# Patient Record
Sex: Female | Born: 1960 | Race: Black or African American | Hispanic: No | Marital: Single | Smoking: Former smoker
Health system: Southern US, Community
[De-identification: ages and names within clinical notes are randomized; demographics above are authoritative.]

## PROBLEM LIST (undated history)

## (undated) DIAGNOSIS — I10 Essential (primary) hypertension: Secondary | ICD-10-CM

## (undated) DIAGNOSIS — M1990 Unspecified osteoarthritis, unspecified site: Secondary | ICD-10-CM

## (undated) DIAGNOSIS — Z9289 Personal history of other medical treatment: Secondary | ICD-10-CM

## (undated) DIAGNOSIS — K219 Gastro-esophageal reflux disease without esophagitis: Secondary | ICD-10-CM

## (undated) DIAGNOSIS — D649 Anemia, unspecified: Secondary | ICD-10-CM

## (undated) DIAGNOSIS — H547 Unspecified visual loss: Secondary | ICD-10-CM

## (undated) DIAGNOSIS — E119 Type 2 diabetes mellitus without complications: Secondary | ICD-10-CM

## (undated) HISTORY — DX: Anemia, unspecified: D64.9

## (undated) HISTORY — DX: Unspecified visual loss: H54.7

## (undated) HISTORY — PX: SHOULDER ARTHROSCOPY W/ ROTATOR CUFF REPAIR: SHX2400

## (undated) HISTORY — PX: EYE SURGERY: SHX253

## (undated) HISTORY — PX: TUBAL LIGATION: SHX77

## (undated) HISTORY — PX: FOOT SURGERY: SHX648

## (undated) HISTORY — DX: Essential (primary) hypertension: I10

## (undated) HISTORY — PX: OTHER SURGICAL HISTORY: SHX169

---

## 1997-12-09 ENCOUNTER — Emergency Department (HOSPITAL_COMMUNITY): Admission: EM | Admit: 1997-12-09 | Discharge: 1997-12-10 | Payer: Self-pay | Admitting: Emergency Medicine

## 1998-08-10 ENCOUNTER — Emergency Department (HOSPITAL_COMMUNITY): Admission: EM | Admit: 1998-08-10 | Discharge: 1998-08-10 | Payer: Self-pay | Admitting: Emergency Medicine

## 1998-08-22 ENCOUNTER — Emergency Department (HOSPITAL_COMMUNITY): Admission: EM | Admit: 1998-08-22 | Discharge: 1998-08-23 | Payer: Self-pay | Admitting: Emergency Medicine

## 2000-04-12 ENCOUNTER — Emergency Department (HOSPITAL_COMMUNITY): Admission: EM | Admit: 2000-04-12 | Discharge: 2000-04-12 | Payer: Self-pay | Admitting: Emergency Medicine

## 2000-05-24 ENCOUNTER — Emergency Department (HOSPITAL_COMMUNITY): Admission: EM | Admit: 2000-05-24 | Discharge: 2000-05-24 | Payer: Self-pay | Admitting: Emergency Medicine

## 2000-11-13 ENCOUNTER — Encounter: Payer: Self-pay | Admitting: Emergency Medicine

## 2000-11-13 ENCOUNTER — Emergency Department (HOSPITAL_COMMUNITY): Admission: EM | Admit: 2000-11-13 | Discharge: 2000-11-13 | Payer: Self-pay | Admitting: Emergency Medicine

## 2000-12-12 ENCOUNTER — Emergency Department (HOSPITAL_COMMUNITY): Admission: EM | Admit: 2000-12-12 | Discharge: 2000-12-12 | Payer: Self-pay | Admitting: Emergency Medicine

## 2003-02-04 ENCOUNTER — Emergency Department (HOSPITAL_COMMUNITY): Admission: EM | Admit: 2003-02-04 | Discharge: 2003-02-04 | Payer: Self-pay | Admitting: Emergency Medicine

## 2003-07-19 ENCOUNTER — Emergency Department (HOSPITAL_COMMUNITY): Admission: EM | Admit: 2003-07-19 | Discharge: 2003-07-19 | Payer: Self-pay | Admitting: Emergency Medicine

## 2006-12-30 ENCOUNTER — Emergency Department (HOSPITAL_COMMUNITY): Admission: EM | Admit: 2006-12-30 | Discharge: 2006-12-30 | Payer: Self-pay | Admitting: Emergency Medicine

## 2007-01-01 ENCOUNTER — Emergency Department (HOSPITAL_COMMUNITY): Admission: EM | Admit: 2007-01-01 | Discharge: 2007-01-01 | Payer: Self-pay | Admitting: Emergency Medicine

## 2007-10-12 ENCOUNTER — Emergency Department (HOSPITAL_COMMUNITY): Admission: EM | Admit: 2007-10-12 | Discharge: 2007-10-12 | Payer: Self-pay | Admitting: Emergency Medicine

## 2007-10-14 ENCOUNTER — Emergency Department (HOSPITAL_COMMUNITY): Admission: EM | Admit: 2007-10-14 | Discharge: 2007-10-14 | Payer: Self-pay | Admitting: Emergency Medicine

## 2007-10-19 ENCOUNTER — Emergency Department (HOSPITAL_COMMUNITY): Admission: EM | Admit: 2007-10-19 | Discharge: 2007-10-19 | Payer: Self-pay | Admitting: Emergency Medicine

## 2007-10-30 ENCOUNTER — Emergency Department (HOSPITAL_COMMUNITY): Admission: EM | Admit: 2007-10-30 | Discharge: 2007-10-30 | Payer: Self-pay | Admitting: *Deleted

## 2009-11-29 LAB — CBC AND DIFFERENTIAL: WBC: 7.4 10^3/mL

## 2009-11-29 LAB — TSH: TSH: 1.62 u[IU]/mL (ref <=5.90)

## 2010-11-03 LAB — BASIC METABOLIC PANEL
BUN: 13 mg/dL (ref 4–21)
Creatinine: 0.8 mg/dL (ref 0.5–1.1)
Glucose: 166 mg/dL
Potassium: 4.2 mmol/L (ref 3.4–5.3)

## 2010-12-02 ENCOUNTER — Emergency Department (HOSPITAL_COMMUNITY)
Admission: EM | Admit: 2010-12-02 | Discharge: 2010-12-02 | Disposition: A | Discharge status: Home / Self Care | Payer: Self-pay | Attending: Emergency Medicine | Admitting: Emergency Medicine

## 2010-12-02 ENCOUNTER — Emergency Department (HOSPITAL_COMMUNITY): Payer: Self-pay

## 2010-12-02 DIAGNOSIS — I10 Essential (primary) hypertension: Secondary | ICD-10-CM | POA: Insufficient documentation

## 2010-12-02 DIAGNOSIS — M109 Gout, unspecified: Secondary | ICD-10-CM | POA: Insufficient documentation

## 2010-12-02 DIAGNOSIS — R002 Palpitations: Secondary | ICD-10-CM | POA: Insufficient documentation

## 2010-12-02 DIAGNOSIS — R21 Rash and other nonspecific skin eruption: Secondary | ICD-10-CM | POA: Insufficient documentation

## 2010-12-02 DIAGNOSIS — T465X5A Adverse effect of other antihypertensive drugs, initial encounter: Secondary | ICD-10-CM | POA: Insufficient documentation

## 2010-12-02 DIAGNOSIS — R609 Edema, unspecified: Secondary | ICD-10-CM | POA: Insufficient documentation

## 2010-12-15 DIAGNOSIS — H53139 Sudden visual loss, unspecified eye: Secondary | ICD-10-CM | POA: Insufficient documentation

## 2011-02-20 DIAGNOSIS — H53419 Scotoma involving central area, unspecified eye: Secondary | ICD-10-CM | POA: Insufficient documentation

## 2011-02-20 DIAGNOSIS — Z961 Presence of intraocular lens: Secondary | ICD-10-CM | POA: Insufficient documentation

## 2011-02-20 DIAGNOSIS — H2510 Age-related nuclear cataract, unspecified eye: Secondary | ICD-10-CM | POA: Insufficient documentation

## 2011-03-27 ENCOUNTER — Ambulatory Visit: Payer: Self-pay | Admitting: Family Medicine

## 2011-04-05 ENCOUNTER — Ambulatory Visit: Payer: Self-pay | Admitting: Family Medicine

## 2011-04-05 DIAGNOSIS — F411 Generalized anxiety disorder: Secondary | ICD-10-CM

## 2011-04-05 DIAGNOSIS — M79609 Pain in unspecified limb: Secondary | ICD-10-CM

## 2011-04-05 DIAGNOSIS — I10 Essential (primary) hypertension: Secondary | ICD-10-CM

## 2011-04-11 ENCOUNTER — Encounter: Payer: Self-pay | Admitting: *Deleted

## 2011-04-12 ENCOUNTER — Encounter: Payer: Self-pay | Admitting: *Deleted

## 2011-04-17 ENCOUNTER — Telehealth: Payer: Self-pay

## 2011-04-17 NOTE — Telephone Encounter (Signed)
.  [facility]   PT CONCERNED WITH BP READINGS, BP MEDS NOT WORKING, READINGS ARE 168/91,204/107.183/89  PLEASE ADVISE.   BEST PHONE [phone]

## 2011-04-17 NOTE — Telephone Encounter (Signed)
Pt was put on Xanax about a week and a half ago and stated that would help along with the other blood pressure meds to bring down pressure.  The meds are not helping and pt states she has no chest pains, no headache, no dizziness.  She wants to know what should she do? Please advise.

## 2011-04-17 NOTE — Telephone Encounter (Signed)
LMOM TO CB 

## 2011-04-17 NOTE — Telephone Encounter (Signed)
Pt needs office visit to address BP, please have her bring all current meds.  Thanks.

## 2011-04-17 NOTE — Telephone Encounter (Signed)
SPOKE WITH PT AND ADVISED TO RTC TO DISCUSS BP. PT AGREED AND WILL RECHECK TOMORROW

## 2011-04-26 ENCOUNTER — Encounter: Payer: Self-pay | Admitting: Family Medicine

## 2011-04-26 DIAGNOSIS — I10 Essential (primary) hypertension: Secondary | ICD-10-CM

## 2011-04-26 DIAGNOSIS — H547 Unspecified visual loss: Secondary | ICD-10-CM

## 2011-04-26 DIAGNOSIS — M109 Gout, unspecified: Secondary | ICD-10-CM | POA: Insufficient documentation

## 2011-04-26 DIAGNOSIS — D649 Anemia, unspecified: Secondary | ICD-10-CM | POA: Insufficient documentation

## 2011-05-09 ENCOUNTER — Other Ambulatory Visit: Payer: Self-pay | Admitting: Family Medicine

## 2011-05-30 ENCOUNTER — Other Ambulatory Visit: Payer: Self-pay | Admitting: Physician Assistant

## 2011-06-09 ENCOUNTER — Other Ambulatory Visit: Payer: Self-pay | Admitting: Physician Assistant

## 2011-06-13 ENCOUNTER — Other Ambulatory Visit: Payer: Self-pay | Admitting: Physician Assistant

## 2011-06-13 ENCOUNTER — Telehealth: Payer: Self-pay

## 2011-06-13 ENCOUNTER — Other Ambulatory Visit: Payer: Self-pay | Admitting: Family Medicine

## 2011-06-13 NOTE — Telephone Encounter (Signed)
MR Leech CALLED TO SAY HIS WIFE IS OUT OF HER BP MEDS AND EVEN THOUGH SHE HAVE A BALANCE HERE, SHE CANNOT KEEP COMING IN AND HAVING HER BILL GETS HIGHER. HE WANTED [place] TO CALL IN SOME FOR ANOTHER THREE MONTHS AND WHEN TOLD TO CALL THE PHARMACY, HE STATED IF SOMETHING HAPPENS TO HIS WIFE BECAUSE WE WON'T CALL IN ANY MEDICINE, HE IS GOING TO MAKE SURE HE SUES. PLEASE CALL [phone]

## 2011-06-14 MED ORDER — VITAMIN D (ERGOCALCIFEROL) 1.25 MG (50000 UNIT) PO CAPS: 50000.0000 [IU] | ORAL_CAPSULE | ORAL | Status: DC

## 2011-06-14 NOTE — Telephone Encounter (Signed)
Addended by: Morrell Riddle on: 06/14/2011 03:38 PM   Modules accepted: Orders

## 2011-06-14 NOTE — Telephone Encounter (Signed)
Please note that the phone # for pt is [phone]. Spoke with pt regarding the importance of regular visits.  I will send in meds for 1 month but pt to make an appt for a recheck.  She should also talk to someone in the patient's assistance program while she is waiting for disability.

## 2011-06-21 ENCOUNTER — Ambulatory Visit (INDEPENDENT_AMBULATORY_CARE_PROVIDER_SITE_OTHER): Payer: Self-pay | Admitting: Family Medicine

## 2011-06-21 ENCOUNTER — Encounter: Payer: Self-pay | Admitting: Family Medicine

## 2011-06-21 VITALS — BP 196/105 | HR 86 | Temp 98.1°F | Resp 18 | Ht 66.0 in | Wt 247.0 lb

## 2011-06-21 DIAGNOSIS — I10 Essential (primary) hypertension: Secondary | ICD-10-CM

## 2011-06-21 DIAGNOSIS — M109 Gout, unspecified: Secondary | ICD-10-CM

## 2011-06-21 DIAGNOSIS — IMO0001 Reserved for inherently not codable concepts without codable children: Secondary | ICD-10-CM

## 2011-06-21 DIAGNOSIS — E559 Vitamin D deficiency, unspecified: Secondary | ICD-10-CM

## 2011-06-21 MED ORDER — TRAMADOL HCL 50 MG PO TABS
50.0000 mg | ORAL_TABLET | Freq: Three times a day (TID) | ORAL | Status: AC | PRN
Start: 2011-06-21 — End: 2011-07-01

## 2011-06-21 MED ORDER — ALLOPURINOL 300 MG PO TABS: 300.0000 mg | ORAL_TABLET | Freq: Every day | ORAL | Status: DC

## 2011-06-21 MED ORDER — COLCHICINE 0.6 MG PO TABS: 0.6000 mg | ORAL_TABLET | Freq: Two times a day (BID) | ORAL | Status: DC

## 2011-06-21 MED ORDER — LISINOPRIL 20 MG PO TABS: 20.0000 mg | ORAL_TABLET | Freq: Every day | ORAL | Status: DC

## 2011-06-21 MED ORDER — METOPROLOL TARTRATE 50 MG PO TABS: 50.0000 mg | ORAL_TABLET | Freq: Two times a day (BID) | ORAL | Status: DC

## 2011-06-21 NOTE — Progress Notes (Signed)
  Subjective:    Patient ID: Lacey Meyer, female    DOB: [birth year], 51 y.o.   MRN: [id number]  HPI Pt here for BP recheck. She has malignant HTN and a history of noncompliance. She states  BP at home was 139/84 last week. She ate some food that caused her BP to go up ; she denies  symptoms of headache or chest pain. She has changed her diet and is starting to walk. She has  had a recent flare of Gout.    Review of Systems  Constitutional: Positive for activity change. Negative for fever, diaphoresis and appetite change.  Respiratory: Negative for cough, chest tightness and shortness of breath.   Cardiovascular: Negative for chest pain, palpitations and leg swelling.  Musculoskeletal: Positive for joint swelling.  Neurological: Positive for light-headedness. Negative for dizziness, syncope, numbness and headaches.       Objective:   Physical Exam  Nursing note and vitals reviewed. Constitutional: She is oriented to person, place, and time. She appears well-developed and well-nourished. No distress.  HENT:  Head: Normocephalic and atraumatic.  Eyes:       Pt wears sunglasses throughout visit  Cardiovascular: Normal rate and regular rhythm.   Pulmonary/Chest: Effort normal. No respiratory distress.  Musculoskeletal:       Bilateral foot swelling distally related to Gout  Neurological: She is alert and oriented to person, place, and time. No cranial nerve deficit.  Psychiatric: She has a normal mood and affect. Her behavior is normal. Thought content normal.    Uric acid (Nov. 2012)= 9.6      Assessment & Plan:   1. HTN (hypertension)  Basic metabolic panel; continue current medications and lifestyle / nutrition changes. Limit salt intake.  2. Gout attack  Uric Acid; RX: Colchicine 0.6  #60  5 RFs - pt gets this through a pt's assistance program RX: Tramadol for pain; will no longer prescribe Generic Percocet   3. Vitamin D deficiency  Vitamin D, 25-hydroxy- pt has had  deficiency in the past

## 2011-06-22 LAB — BASIC METABOLIC PANEL
BUN: 9 mg/dL (ref 6–23)
CO2: 23 mEq/L (ref 19–32)
Calcium: 10 mg/dL (ref 8.4–10.5)
Glucose, Bld: 150 mg/dL — ABNORMAL HIGH (ref 70–99)

## 2011-06-22 LAB — URIC ACID: Uric Acid, Serum: 12.4 mg/dL — ABNORMAL HIGH (ref 2.4–7.0)

## 2011-06-25 ENCOUNTER — Encounter: Payer: Self-pay | Admitting: Family Medicine

## 2011-06-25 DIAGNOSIS — IMO0001 Reserved for inherently not codable concepts without codable children: Secondary | ICD-10-CM | POA: Insufficient documentation

## 2011-06-26 NOTE — Progress Notes (Signed)
Quick Note:  Please call pt and advise that the following labs are abnormal...  Vitamin D level is in the low normal range.  You are still having trouble with Gout flare up because uric acid level is very high. Keep taking the Colcrys (colchicine) twice a day and eliminate  those foods that you know are making Gout worse. Blood sugar is above normal; eliminate concentrated sugars ( "sweets"), sodas and starches like white bread from diet and eat less rice, potatoes and pasta. We will need to re-check your blood sugar at your next visit.  Copy of labs to pt. ______

## 2011-06-27 ENCOUNTER — Telehealth: Payer: Self-pay

## 2011-06-27 NOTE — Telephone Encounter (Signed)
Please advise 

## 2011-06-27 NOTE — Telephone Encounter (Signed)
1. Call the Rx for colchicine in to the assistance program (you may need to get it from her chart) as per the order in the last OV with Dr. Audria Nine.  2. Then forward the message to Dr. Audria Nine regarding the pain not alleviated by the Tramadol.

## 2011-06-27 NOTE — Telephone Encounter (Signed)
.  [facility] The patient called to state that she needs the Colchicine medication called into the 1-800 # for the assistance program and that the Tramadol is not helping with her gout pain.  The patient states she is in severe pain from the gout and needs a medication that will relieve the symptoms.  Please call the patient at [phone].

## 2011-07-02 NOTE — Telephone Encounter (Signed)
Chart pulled to TL

## 2011-07-02 NOTE — Telephone Encounter (Signed)
Called pt assistance program and was told they had just shipped 30 day supply of colchicine to pt. Gave OK on asst prog VM that they can dispense 90 day at a time w/1 RF if pt prefers. Dr Audria Nine, do you want to Rx a stronger pain med for pt?

## 2011-07-04 NOTE — Telephone Encounter (Signed)
I discussed pain medication with pt at last visit; I will not prescribe any stronger medication for pain.

## 2011-07-05 NOTE — Telephone Encounter (Signed)
LMOM to CB. 

## 2011-07-05 NOTE — Telephone Encounter (Signed)
Gave pt message from Dr Audria Nine about pain meds and info about call to assis program. Pt reports she has gotten her colchicine and she has/is making changes in her eating/exercise that seems to be helping the pain be more tolerable, and hasn't been taking the tramadol since it wasn't helping and she didn't want to keep taking it.

## 2011-08-05 ENCOUNTER — Emergency Department (HOSPITAL_COMMUNITY)
Admission: EM | Admit: 2011-08-05 | Discharge: 2011-08-05 | Disposition: A | Discharge status: Home / Self Care | Payer: Medicaid Other | Attending: Emergency Medicine | Admitting: Emergency Medicine

## 2011-08-05 ENCOUNTER — Encounter (HOSPITAL_COMMUNITY): Payer: Self-pay | Admitting: *Deleted

## 2011-08-05 DIAGNOSIS — R300 Dysuria: Secondary | ICD-10-CM | POA: Insufficient documentation

## 2011-08-05 DIAGNOSIS — R319 Hematuria, unspecified: Secondary | ICD-10-CM | POA: Insufficient documentation

## 2011-08-05 DIAGNOSIS — R350 Frequency of micturition: Secondary | ICD-10-CM | POA: Insufficient documentation

## 2011-08-05 DIAGNOSIS — Z79899 Other long term (current) drug therapy: Secondary | ICD-10-CM | POA: Insufficient documentation

## 2011-08-05 DIAGNOSIS — R279 Unspecified lack of coordination: Secondary | ICD-10-CM | POA: Insufficient documentation

## 2011-08-05 DIAGNOSIS — Z8639 Personal history of other endocrine, nutritional and metabolic disease: Secondary | ICD-10-CM | POA: Insufficient documentation

## 2011-08-05 DIAGNOSIS — N390 Urinary tract infection, site not specified: Secondary | ICD-10-CM | POA: Insufficient documentation

## 2011-08-05 DIAGNOSIS — Z862 Personal history of diseases of the blood and blood-forming organs and certain disorders involving the immune mechanism: Secondary | ICD-10-CM | POA: Insufficient documentation

## 2011-08-05 DIAGNOSIS — I10 Essential (primary) hypertension: Secondary | ICD-10-CM | POA: Insufficient documentation

## 2011-08-05 LAB — URINALYSIS, ROUTINE W REFLEX MICROSCOPIC
Bilirubin Urine: NEGATIVE
Glucose, UA: NEGATIVE mg/dL
Specific Gravity, Urine: 1.015 (ref 1.005–1.030)
Urobilinogen, UA: 0.2 mg/dL (ref 0.0–1.0)

## 2011-08-05 LAB — URINE MICROSCOPIC-ADD ON

## 2011-08-05 MED ORDER — CIPROFLOXACIN HCL 500 MG PO TABS: 500.0000 mg | ORAL_TABLET | Freq: Two times a day (BID) | ORAL | Status: AC

## 2011-08-05 MED ORDER — PHENAZOPYRIDINE HCL 200 MG PO TABS: 200.0000 mg | ORAL_TABLET | Freq: Three times a day (TID) | ORAL | Status: AC

## 2011-08-05 NOTE — ED Notes (Signed)
Pt from home with reports of burning and pain with urination as well as urinary urgency, frequency and hematuria that started today at 1100.

## 2011-08-05 NOTE — Discharge Instructions (Signed)

## 2011-08-05 NOTE — ED Provider Notes (Signed)
History     CSN: [id number]  Arrival date & time 08/05/11  1528   First MD Initiated Contact with Patient 08/05/11 1617      Chief Complaint  Patient presents with  . Dysuria  . Hematuria    (Consider location/radiation/quality/duration/timing/severity/associated sxs/prior treatment) Patient is a 51 y.o. female presenting with dysuria and hematuria. The history is provided by the patient.  Dysuria  This is a new problem. The current episode started 3 to 5 hours ago. The problem occurs every urination. The problem has not changed since onset.The quality of the pain is described as burning. The pain is moderate. There has been no fever. There is a history of pyelonephritis (once a decade ago). Associated symptoms include frequency and hematuria. Pertinent negatives include no nausea, no vomiting and no flank pain. Her past medical history does not include kidney stones, single kidney, urological procedure or catheterization.  Hematuria Irritative symptoms include frequency. Associated symptoms include dysuria. Pertinent negatives include no abdominal pain, flank pain, nausea or vomiting. There is no history of kidney stones.   patient has hypertension here. She states every time she goes to the doctor her blood pressure goes up. She states that her doctor has her take a Xanax before she goes. No chest pain. No trouble breathing. No numbness or weakness.  Past Medical History  Diagnosis Date  . HTN (hypertension)   . Anemia   . Gout   . Impaired vision     Past Surgical History  Procedure Date  . Shoulder arthroscopy w/ rotator cuff repair   . Btl   . Eye surgery     Family History  Problem Relation Age of Onset  . Hypertension Mother   . Hypertension Father     History  Substance Use Topics  . Smoking status: Former Smoker    Quit date: 03/06/2010  . Smokeless tobacco: Never Used  . Alcohol Use: No    OB History    Grav Para Term Preterm Abortions TAB SAB Ect Mult  Living                  Review of Systems  Constitutional: Negative for activity change and appetite change.  HENT: Negative for neck stiffness.   Eyes: Negative for pain.  Respiratory: Negative for chest tightness and shortness of breath.   Cardiovascular: Negative for chest pain and leg swelling.  Gastrointestinal: Negative for nausea, vomiting, abdominal pain and diarrhea.  Genitourinary: Positive for dysuria, frequency and hematuria. Negative for flank pain.  Musculoskeletal: Negative for back pain.       Patient has some pain in her joints for gout.  Skin: Negative for rash.  Neurological: Negative for weakness, numbness and headaches.  Psychiatric/Behavioral: Negative for behavioral problems.    Allergies  Ampicillin and Doxycycline  Home Medications   Current Outpatient Rx  Name Route Sig Dispense Refill  . ALLOPURINOL 300 MG PO TABS Oral Take 1 tablet (300 mg total) by mouth daily. 30 tablet 5  . ALPRAZOLAM 0.25 MG PO TABS Oral Take 0.25 mg by mouth 3 (three) times daily as needed. For anxiety    . COLCHICINE 0.6 MG PO TABS Oral Take 1 tablet (0.6 mg total) by mouth 2 (two) times daily. 60 tablet 5  . LISINOPRIL 20 MG PO TABS Oral Take 1 tablet (20 mg total) by mouth daily. 30 tablet 5  . METOPROLOL TARTRATE 50 MG PO TABS Oral Take 1 tablet (50 mg total) by mouth 2 (two) times  daily. 60 tablet 5  . TRAMADOL HCL 50 MG PO TABS Oral Take 50 mg by mouth every 6 (six) hours as needed. For pain    . CIPROFLOXACIN HCL 500 MG PO TABS Oral Take 1 tablet (500 mg total) by mouth every 12 (twelve) hours. 6 tablet 0  . PHENAZOPYRIDINE HCL 200 MG PO TABS Oral Take 1 tablet (200 mg total) by mouth 3 (three) times daily. 6 tablet 0    BP 213/96  Pulse 91  Temp(Src) 98.3 F (36.8 C) (Oral)  Resp 20  Ht 5\' 8"  (1.727 m)  Wt 266 lb 15.6 oz (121.1 kg)  BMI 40.59 kg/m2  SpO2 100%  Physical Exam  Constitutional: She appears well-developed.  HENT:  Head: Normocephalic.  Eyes:  Pupils are equal, round, and reactive to light.  Cardiovascular: Normal rate.   Pulmonary/Chest: Effort normal.  Abdominal: Soft. There is no tenderness.  Genitourinary:       No CVA tenderness  Musculoskeletal: Normal range of motion.  Neurological: She is alert. Coordination abnormal.  Skin: Skin is warm.    ED Course  Procedures (including critical care time)  Labs Reviewed  URINALYSIS, ROUTINE W REFLEX MICROSCOPIC - Abnormal; Notable for the following:    APPearance CLOUDY (*)    Hgb urine dipstick LARGE (*)    Protein, ur 100 (*)    Nitrite POSITIVE (*)    Leukocytes, UA MODERATE (*)    All other components within normal limits  URINE MICROSCOPIC-ADD ON - Abnormal; Notable for the following:    Bacteria, UA MANY (*)    All other components within normal limits  URINE CULTURE   No results found.   1. UTI (urinary tract infection)       MDM  Patient with dysuria along with frequency and hematuria. Began this morning. She is in apparent urinary tract infection. She is hypertensive, but states he always gets this way with his Dr. She keeps monitors of her blood pressure home has not been as high. She will followup with her PCP.        Juliet Rude. Rubin Payor, MD 08/05/11 1751

## 2011-08-08 LAB — URINE CULTURE

## 2011-08-09 NOTE — ED Notes (Signed)
+  Urine. Patient treated with Cipro. Sensitive to same. Per protocol MD. °

## 2011-10-03 ENCOUNTER — Ambulatory Visit: Payer: Self-pay | Admitting: Family Medicine

## 2011-10-03 VITALS — BP 152/88 | HR 79 | Temp 98.7°F | Resp 20 | Ht 67.0 in | Wt 253.0 lb

## 2011-10-03 DIAGNOSIS — R609 Edema, unspecified: Secondary | ICD-10-CM

## 2011-10-03 DIAGNOSIS — M109 Gout, unspecified: Secondary | ICD-10-CM

## 2011-10-03 DIAGNOSIS — I10 Essential (primary) hypertension: Secondary | ICD-10-CM

## 2011-10-03 DIAGNOSIS — G4700 Insomnia, unspecified: Secondary | ICD-10-CM

## 2011-10-03 DIAGNOSIS — G8929 Other chronic pain: Secondary | ICD-10-CM

## 2011-10-03 LAB — COMPREHENSIVE METABOLIC PANEL
ALT: 19 U/L (ref 0–35)
AST: 17 U/L (ref 0–37)
Albumin: 4.3 g/dL (ref 3.5–5.2)
Alkaline Phosphatase: 65 U/L (ref 39–117)
BUN: 8 mg/dL (ref 6–23)
CO2: 26 mEq/L (ref 19–32)
Calcium: 9.5 mg/dL (ref 8.4–10.5)
Chloride: 98 mEq/L (ref 96–112)
Creat: 0.61 mg/dL (ref 0.50–1.10)
Glucose, Bld: 154 mg/dL — ABNORMAL HIGH (ref 70–99)
Potassium: 3.3 mEq/L — ABNORMAL LOW (ref 3.5–5.3)
Sodium: 137 mEq/L (ref 135–145)
Total Bilirubin: 0.3 mg/dL (ref 0.3–1.2)
Total Protein: 7.4 g/dL (ref 6.0–8.3)

## 2011-10-03 LAB — LIPID PANEL
Cholesterol: 181 mg/dL (ref 0–200)
HDL: 45 mg/dL (ref >39)
LDL Cholesterol: 69 mg/dL (ref 0–99)
Total CHOL/HDL Ratio: 4 Ratio
Triglycerides: 335 mg/dL — ABNORMAL HIGH (ref <150)
VLDL: 67 mg/dL — ABNORMAL HIGH (ref 0–40)

## 2011-10-03 LAB — POCT CBC
Granulocyte percent: 54.6 %G (ref 37–80)
HCT, POC: 44.3 % (ref 37.7–47.9)
Hemoglobin: 13.8 g/dL (ref 12.2–16.2)
Lymph, poc: 2.9 (ref 0.6–3.4)
MCH, POC: 27.6 pg (ref 27–31.2)
MCHC: 31.2 g/dL — AB (ref 31.8–35.4)
MCV: 88.7 fL (ref 80–97)
MID (cbc): 0.7 (ref 0–0.9)
MPV: 9.4 fL (ref 0–99.8)
POC Granulocyte: 4.4 (ref 2–6.9)
POC LYMPH PERCENT: 36.2 %L (ref 10–50)
POC MID %: 9.2 %M (ref 0–12)
Platelet Count, POC: 322 10*3/uL (ref 142–424)
RBC: 5 M/uL (ref 4.04–5.48)
RDW, POC: 13.6 %
WBC: 8 10*3/uL (ref 4.6–10.2)

## 2011-10-03 LAB — TSH: TSH: 2.524 u[IU]/mL (ref 0.350–4.500)

## 2011-10-03 MED ORDER — SPIRONOLACTONE 25 MG PO TABS: 25.0000 mg | ORAL_TABLET | Freq: Every day | ORAL | Status: DC

## 2011-10-03 MED ORDER — ALPRAZOLAM 0.25 MG PO TABS: 0.2500 mg | ORAL_TABLET | Freq: Two times a day (BID) | ORAL | Status: DC

## 2011-10-03 MED ORDER — LISINOPRIL 20 MG PO TABS: 20.0000 mg | ORAL_TABLET | Freq: Every day | ORAL | Status: DC

## 2011-10-03 MED ORDER — ALLOPURINOL 300 MG PO TABS: 300.0000 mg | ORAL_TABLET | Freq: Every day | ORAL | Status: DC

## 2011-10-03 MED ORDER — METOPROLOL TARTRATE 50 MG PO TABS: 50.0000 mg | ORAL_TABLET | Freq: Two times a day (BID) | ORAL | Status: DC

## 2011-10-03 MED ORDER — GABAPENTIN 300 MG PO CAPS: 300.0000 mg | ORAL_CAPSULE | Freq: Every day | ORAL | Status: DC

## 2011-10-03 MED ORDER — COLCHICINE 0.6 MG PO TABS: 0.6000 mg | ORAL_TABLET | Freq: Two times a day (BID) | ORAL | Status: DC

## 2011-10-03 NOTE — Progress Notes (Signed)
51 yo woman with hypertension for 5 years and impaired vision for 3 years.    She has had extensive evaluations for visual loss at [facility] with scans and retinal studies all normal.  She is legally blind.  She is filing for disability.  Complains of "gout" in left foot and knee.  Objective:  NAD HEENT:  Normal fundi, TM's and mouth Neck: supple, no adenopathy Chest:  Clear Heart: regular, no murmur Ext:  1+ edema  Assessment:  Hypertensive woman with vision loss.  Perplexing loss of vision.  The left sided joint pains do not show obvious joint swelling, but could very well be gout.   Another possibility for vision loss is conversion reaction symptoms.  This would require a neurology eval.  Plan:  1. Hypertension  lisinopril (PRINIVIL,ZESTRIL) 20 MG tablet, metoprolol (LOPRESSOR) 50 MG tablet, POCT CBC, Comprehensive metabolic panel, Lipid panel, TSH  2. Gout  allopurinol (ZYLOPRIM) 300 MG tablet, colchicine 0.6 MG tablet, Comprehensive metabolic panel  3. Insomnia  ALPRAZolam (XANAX) 0.25 MG tablet  4. Edema  spironolactone (ALDACTONE) 25 MG tablet, POCT CBC, Comprehensive metabolic panel, TSH  5. Chronic pain  gabapentin (NEURONTIN) 300 MG capsule   Handicap sticker application completed for patient and driver

## 2011-10-04 ENCOUNTER — Other Ambulatory Visit: Payer: Self-pay | Admitting: Family Medicine

## 2011-10-04 DIAGNOSIS — E876 Hypokalemia: Secondary | ICD-10-CM

## 2011-10-04 MED ORDER — POTASSIUM CHLORIDE CRYS ER 20 MEQ PO TBCR: 20.0000 meq | EXTENDED_RELEASE_TABLET | Freq: Every day | ORAL | Status: DC

## 2011-10-05 ENCOUNTER — Telehealth: Payer: Self-pay

## 2011-10-05 DIAGNOSIS — M109 Gout, unspecified: Secondary | ICD-10-CM

## 2011-10-05 NOTE — Telephone Encounter (Signed)
Lacey Meyer went to [facility] to pick up her prescriptions and the colchicine (ZO#[id number]) was filled there costing $268. The patient qualifies for a program allowing her to receive the rx at a lower cost when called in to [facility] and mailed to her. We apparently need to call this pharmacy to fill the rx with them.  Best [phone] [facility] tel#[phone]

## 2011-10-07 ENCOUNTER — Other Ambulatory Visit: Payer: Self-pay | Admitting: *Deleted

## 2011-10-07 DIAGNOSIS — M109 Gout, unspecified: Secondary | ICD-10-CM

## 2011-10-07 MED ORDER — COLCHICINE 0.6 MG PO TABS: 0.6000 mg | ORAL_TABLET | Freq: Two times a day (BID) | ORAL | Status: DC

## 2011-10-07 NOTE — Telephone Encounter (Signed)
I sent the RX the [facility] for the patient.

## 2011-10-07 NOTE — Telephone Encounter (Signed)
PT NOTIFIED THAT RX WAS SENT INTO PHARMACY 

## 2011-12-13 ENCOUNTER — Other Ambulatory Visit: Payer: Self-pay | Admitting: Internal Medicine

## 2011-12-13 DIAGNOSIS — Z1231 Encounter for screening mammogram for malignant neoplasm of breast: Secondary | ICD-10-CM

## 2012-01-03 ENCOUNTER — Ambulatory Visit
Admission: RE | Admit: 2012-01-03 | Discharge: 2012-01-03 | Disposition: A | Discharge status: Home / Self Care | Payer: Medicaid Other | Source: Ambulatory Visit | Attending: Internal Medicine | Admitting: Internal Medicine

## 2012-01-03 ENCOUNTER — Ambulatory Visit: Payer: Self-pay | Admitting: Family Medicine

## 2012-01-03 DIAGNOSIS — Z1231 Encounter for screening mammogram for malignant neoplasm of breast: Secondary | ICD-10-CM

## 2012-04-12 DIAGNOSIS — Z0271 Encounter for disability determination: Secondary | ICD-10-CM

## 2012-05-01 ENCOUNTER — Telehealth: Payer: Self-pay

## 2012-06-05 ENCOUNTER — Other Ambulatory Visit (HOSPITAL_COMMUNITY): Payer: Self-pay | Admitting: Internal Medicine

## 2012-06-05 DIAGNOSIS — I10 Essential (primary) hypertension: Secondary | ICD-10-CM

## 2012-06-12 ENCOUNTER — Ambulatory Visit (HOSPITAL_COMMUNITY)
Admission: RE | Admit: 2012-06-12 | Discharge: 2012-06-12 | Disposition: A | Discharge status: Home / Self Care | Payer: Medicaid Other | Source: Ambulatory Visit | Attending: Cardiovascular Disease | Admitting: Cardiovascular Disease

## 2012-06-12 DIAGNOSIS — I10 Essential (primary) hypertension: Secondary | ICD-10-CM | POA: Insufficient documentation

## 2012-06-12 DIAGNOSIS — H53129 Transient visual loss, unspecified eye: Secondary | ICD-10-CM | POA: Insufficient documentation

## 2012-06-12 DIAGNOSIS — H547 Unspecified visual loss: Secondary | ICD-10-CM

## 2012-06-12 NOTE — Progress Notes (Signed)
Carotid Duplex Complete Lacey Meyer 

## 2012-06-23 NOTE — Telephone Encounter (Signed)
ERROR

## 2012-08-25 ENCOUNTER — Other Ambulatory Visit: Payer: Self-pay | Admitting: Internal Medicine

## 2012-08-25 DIAGNOSIS — I10 Essential (primary) hypertension: Secondary | ICD-10-CM

## 2012-08-27 ENCOUNTER — Ambulatory Visit (HOSPITAL_BASED_OUTPATIENT_CLINIC_OR_DEPARTMENT_OTHER): Payer: Medicare Other | Attending: Internal Medicine

## 2012-08-27 VITALS — Ht 65.5 in | Wt 248.0 lb

## 2012-08-27 DIAGNOSIS — G4733 Obstructive sleep apnea (adult) (pediatric): Secondary | ICD-10-CM

## 2012-08-27 DIAGNOSIS — G4761 Periodic limb movement disorder: Secondary | ICD-10-CM | POA: Insufficient documentation

## 2012-08-30 DIAGNOSIS — R0989 Other specified symptoms and signs involving the circulatory and respiratory systems: Secondary | ICD-10-CM

## 2012-08-30 DIAGNOSIS — R0609 Other forms of dyspnea: Secondary | ICD-10-CM

## 2012-08-30 DIAGNOSIS — G4733 Obstructive sleep apnea (adult) (pediatric): Secondary | ICD-10-CM

## 2012-08-30 NOTE — Procedures (Signed)
Lacey Meyer, Lacey Meyer              ACCOUNT NO.:  [id number]  MEDICAL RECORD NO.:  [id number]          PATIENT TYPE:  OUT  LOCATION:  SLEEP CENTER                 FACILITY:  [facility]  PHYSICIAN:  Clinton D. Maple Hudson, MD, FCCP, FACPDATE OF BIRTH:  09-13-[birth year]  DATE OF STUDY:  08/27/2012                           NOCTURNAL POLYSOMNOGRAM  REFERRING PHYSICIAN:  Fleet Contras, M.D.  INDICATION FOR STUDY:  Hypersomnia with sleep apnea.  EPWORTH SLEEPINESS SCORE:  5/24.  BMI 41, weight 248 pounds, height 65.5 inches, neck 17.5 inches.  MEDICATIONS:  Home medications are charted for review.  SLEEP ARCHITECTURE:  Total sleep time 369.5 minutes with sleep efficiency 91.5%.  Stage I was 8.5%, stage II 87.6%, stage III absent, REM 3.9% of total sleep time.  Sleep latency 10 minutes, REM latency 71.5 minutes, awake after sleep onset 24 minutes, arousal index 7.5.  BEDTIME MEDICATION:  Ambien.  RESPIRATORY DATA:  Apnea-hypopnea index (AHI) 5.5 per hour.  A total of 34 events was scored including two obstructive apneas and 32 hypopneas. Events were not positional.  REM AHI 8.3 per hour.  There were not enough early events to permit application of split protocol CPAP titration.  OXYGEN DATA:  Moderately loud snoring with oxygen desaturation to a nadir of 81% and mean oxygen saturation through the study of 95.2% on room air.  CARDIAC DATA:  Normal sinus rhythm.  MOVEMENT-PARASOMNIA:  A total of 22 limb jerks were counted, of which 7 were associated with arousals or awakening for periodic limb movement with arousal index of 1.1 per hour.  Bathroom x1.  IMPRESSIONS-RECOMMENDATIONS: 1. Minimal obstructive sleep apnea/hypopnea syndrome, AHI 5.5 per hour     (the normal range for adults is from 0 to 5 events per hour).     Moderately loud snoring with oxygen desaturation to a nadir of 81%     and mean oxygen saturation through the study of 95.2% on room air. 2. There were not enough respiratory  events to permit the application     of split protocol CPAP titration on this     study.  Scores in this range would often be addressed first with     conservative measures including encouragement to sleep off flat of     back and to lose weight.  Other options can be considered as     appropriate.     Clinton D. Maple Hudson, MD, [facility], FACP Diplomate, American Board of Sleep Medicine    CDY/MEDQ  D:  08/30/2012 11:35:45  T:  08/30/2012 12:17:33  Job:  [id number]  cc:   [employer]IC[employer]

## 2012-12-10 ENCOUNTER — Telehealth: Payer: Self-pay | Admitting: *Deleted

## 2012-12-10 NOTE — Telephone Encounter (Signed)
Pt request pain medicine Gabapentin not helping. Please advise.

## 2012-12-10 NOTE — Telephone Encounter (Signed)
Would need to see her but we don't prescribe chronic long term pain meds.  Please inform patient

## 2012-12-11 ENCOUNTER — Encounter: Payer: Self-pay | Admitting: *Deleted

## 2012-12-11 NOTE — Telephone Encounter (Signed)
Informed pt of Dr Geryl Rankins recommendation to schedule an appt. Pt states has an appt next week.

## 2012-12-23 ENCOUNTER — Ambulatory Visit (INDEPENDENT_AMBULATORY_CARE_PROVIDER_SITE_OTHER): Payer: Medicare Other | Admitting: Podiatry

## 2012-12-23 ENCOUNTER — Encounter: Payer: Self-pay | Admitting: Podiatry

## 2012-12-23 ENCOUNTER — Telehealth: Payer: Self-pay | Admitting: *Deleted

## 2012-12-23 VITALS — BP 133/75 | HR 92 | Resp 16 | Ht 66.0 in | Wt 241.0 lb

## 2012-12-23 DIAGNOSIS — M79609 Pain in unspecified limb: Secondary | ICD-10-CM

## 2012-12-23 DIAGNOSIS — M19079 Primary osteoarthritis, unspecified ankle and foot: Secondary | ICD-10-CM

## 2012-12-23 DIAGNOSIS — Z79899 Other long term (current) drug therapy: Secondary | ICD-10-CM

## 2012-12-23 DIAGNOSIS — G589 Mononeuropathy, unspecified: Secondary | ICD-10-CM

## 2012-12-23 NOTE — Progress Notes (Signed)
Lacey Meyer presents today for followup of painful feet bilaterally. Surgical scar to the right foot is nonpainful. She continues to take 2400 mg up gabapentin daily. I getting a lot of relief from this. States that it seems to be more arthritic pain been neuropathy that hurts nowadays.  Objective: Pulses are strongly palpable. Neurologic sensorium is decreased per Semmes-Weinstein monofilament. Range of motion to the bilateral foot is intact and full but painful particularly in the midfoot and rear foot bilaterally.  Assessment: Chronic pain secondary to osteoarthritis. Neuropathy.  Plan: Continue Neurontin 2400 mg daily. Start 800 mg ibuprofen 3 times daily with food. Followup with pain clinic in the near future.

## 2012-12-23 NOTE — Patient Instructions (Signed)
Continue Gabapentin.  Start 800mg  Ibuprofen three times daily.  Follow up with pain clinic.  Call us if you have not heard from them in two weeks.

## 2012-12-23 NOTE — Telephone Encounter (Signed)
referral to [facility] faxed.

## 2013-02-23 ENCOUNTER — Other Ambulatory Visit: Payer: Self-pay

## 2013-02-23 DIAGNOSIS — Z1231 Encounter for screening mammogram for malignant neoplasm of breast: Secondary | ICD-10-CM

## 2013-02-28 DIAGNOSIS — H3560 Retinal hemorrhage, unspecified eye: Secondary | ICD-10-CM | POA: Insufficient documentation

## 2013-03-24 ENCOUNTER — Ambulatory Visit: Payer: Medicare Other | Admitting: Podiatry

## 2013-03-26 ENCOUNTER — Encounter: Payer: Self-pay | Admitting: Podiatry

## 2013-03-26 ENCOUNTER — Ambulatory Visit (INDEPENDENT_AMBULATORY_CARE_PROVIDER_SITE_OTHER): Payer: Medicare Other | Admitting: Podiatry

## 2013-03-26 VITALS — BP 131/73 | HR 95 | Resp 18

## 2013-03-26 DIAGNOSIS — M76829 Posterior tibial tendinitis, unspecified leg: Secondary | ICD-10-CM

## 2013-03-26 NOTE — Progress Notes (Signed)
Been going to the pain center, its the same as she refers to her neuropathy. She states that she is no longer taking her ibuprofen. She's beginning to have pain right in here a she points to the medial aspect of the right foot along the posterior tibial tendon. States it bothers her anytime she is up walking or standing for a period of time. She denies trauma to the right foot.  Objective: Vital signs are stable she is alert and oriented x3. She has tenderness on palpation of the posterior tibial tendon as it inserts on the navicular tuberosity. There does appear to be a small amount of fluid overlying the tendon or within the tendon sheath that is palpable. Radiographically no change.  Assessment: Chronic pain right lower extremity with posterior tibial tendinitis right.  Plan: Injected the point of maximal tenderness today with 2 mg of dexamethasone and local anesthetic as the tendon in ears the navicular tuberosity. I will followup with her in 2 weeks.

## 2013-04-01 ENCOUNTER — Ambulatory Visit
Admission: RE | Admit: 2013-04-01 | Discharge: 2013-04-01 | Disposition: A | Discharge status: Home / Self Care | Payer: Medicare Other | Source: Ambulatory Visit

## 2013-04-01 DIAGNOSIS — Z1231 Encounter for screening mammogram for malignant neoplasm of breast: Secondary | ICD-10-CM

## 2013-06-19 ENCOUNTER — Ambulatory Visit (INDEPENDENT_AMBULATORY_CARE_PROVIDER_SITE_OTHER): Payer: Medicare Other | Admitting: Podiatry

## 2013-06-19 ENCOUNTER — Encounter: Payer: Self-pay | Admitting: Podiatry

## 2013-06-19 VITALS — BP 154/80 | HR 90 | Resp 12

## 2013-06-19 DIAGNOSIS — M79609 Pain in unspecified limb: Secondary | ICD-10-CM

## 2013-06-19 DIAGNOSIS — M898X9 Other specified disorders of bone, unspecified site: Secondary | ICD-10-CM

## 2013-06-19 DIAGNOSIS — B351 Tinea unguium: Secondary | ICD-10-CM

## 2013-06-19 DIAGNOSIS — M205X9 Other deformities of toe(s) (acquired), unspecified foot: Secondary | ICD-10-CM

## 2013-06-19 NOTE — Progress Notes (Signed)
She presents today with a chief complaint of painful dorsal foot right. She also has pain about the hallux of the right foot. This is been 1 on for quite some time and she has been seeing physical therapy for this. However at this point she has reached her maximum outpocketing is unable to receive anymore treatment. At this point she is requesting surgical intervention since we have talked about in the past. Otherwise her nails are thick yellow dystrophic with mycotic and pulses are palpable bilateral.  Objective: Pulses are strongly palpable bilateral. 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 Consistent with the peroneal nerve entrapment. She also has hallux interphalangeal of the right great toe. Mild hallux abductovalgus is noted. Nails are thick yellow dystrophic onychomycotic and painful palpation.  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  Plan: Discussed etiology pathology conservative versus surgical therapies at this point surgery will consist of a dorsal tarsal exostectomy to decompress the deep peroneal nerve. Also we will perform an Akin osteotomy to realign her hallux right. She understands this and is amenable to it we did discuss a possible postop complications which may include but are not limited to postop pain bleeding swelling infection need for further surgery also digit loss limb loss of life. I debrided nails 1 through 5 bilateral is cover service secondary to pain she signed Dr. pages of the consent form today I will followup with her in the near future for surgical intervention when she is cleared by her medical doctor.

## 2013-06-25 ENCOUNTER — Ambulatory Visit: Payer: Medicare Other | Admitting: Podiatry

## 2013-06-25 ENCOUNTER — Encounter: Payer: Self-pay | Admitting: *Deleted

## 2013-06-30 ENCOUNTER — Telehealth: Payer: Self-pay | Admitting: *Deleted

## 2013-06-30 NOTE — Telephone Encounter (Signed)
I called the patient and informed her we received medical clearance.  I asked when she would like to schedule surgery.  She stated anytime after the 26th because she has to do something at her church.  I offered her 07/10/13.  She stated that's fine.

## 2013-06-30 NOTE — Telephone Encounter (Signed)
I would like to change surgery date from May 1st.  That's the day I pay my bills.  I'd like to take care of all that before I have the surgery.  I returned her call and offered her 07/17/13.  She stated that will be fine.

## 2013-06-30 NOTE — Telephone Encounter (Signed)
Patient came by the office.  She stated she does not want her surgery before 07/05/13.  Any questions call.

## 2013-07-17 DIAGNOSIS — M898X9 Other specified disorders of bone, unspecified site: Secondary | ICD-10-CM

## 2013-07-17 DIAGNOSIS — M2030 Hallux varus (acquired), unspecified foot: Secondary | ICD-10-CM

## 2013-07-23 ENCOUNTER — Encounter: Payer: Self-pay | Admitting: Podiatry

## 2013-07-23 ENCOUNTER — Ambulatory Visit (INDEPENDENT_AMBULATORY_CARE_PROVIDER_SITE_OTHER): Payer: Medicare Other

## 2013-07-23 ENCOUNTER — Ambulatory Visit (INDEPENDENT_AMBULATORY_CARE_PROVIDER_SITE_OTHER): Payer: Medicare Other | Admitting: Podiatry

## 2013-07-23 VITALS — BP 138/82 | HR 103 | Resp 16

## 2013-07-23 DIAGNOSIS — Z9889 Other specified postprocedural states: Secondary | ICD-10-CM

## 2013-07-23 DIAGNOSIS — M898X9 Other specified disorders of bone, unspecified site: Secondary | ICD-10-CM

## 2013-07-23 NOTE — Progress Notes (Signed)
She presents today one week status post Akin osteotomy and dorsal tarsal exostectomy right foot. She states it really hasn't bothered her too much better she does feel a little faint today because she took her diabetic medication without eating.  Objective: Vital signs are stable she is alert and oriented x3. Dry sterile dressing intact once removed demonstrates rectus hallux right minimal edema no erythema saline is drainage or odor sutures are intact to the hallux as well as the dorsal aspect of the right foot. Radiographic evaluation confirms good placement with staple Akin osteotomy right foot.  Assessment: Well-healing surgical foot right.  Plan: I will redressed today dressed a compressive dressing she's to continue ambulating in her Cam Walker and I will followup with her in one week

## 2013-07-30 ENCOUNTER — Ambulatory Visit (INDEPENDENT_AMBULATORY_CARE_PROVIDER_SITE_OTHER): Payer: Medicare Other | Admitting: Podiatry

## 2013-07-30 VITALS — BP 168/82 | HR 104 | Resp 17 | Ht 68.0 in | Wt 247.0 lb

## 2013-07-30 DIAGNOSIS — Z9889 Other specified postprocedural states: Secondary | ICD-10-CM

## 2013-07-30 NOTE — Progress Notes (Signed)
She presents today for her second postop visit regarding a Akin osteotomy and a dorsal tarsal exostectomy. She denies fever chills nausea vomiting muscle aches and pains. She has been bend down with very little pain to her right foot.  Objective: Vital signs are stable she is alert and oriented x3. Pulses are palpable. Sutures are intact margins are well coapted she has good range of motion of the first metatarsophalangeal joint right.  Assessment: Well-healing surgical foot right.  Plan: Redressed today with a dry sterile compressive dressing put her in a Darco shoe and will followup with her in 3 weeks for another set of x-rays

## 2013-08-07 ENCOUNTER — Encounter: Payer: Medicare Other | Admitting: Podiatrist

## 2013-08-07 ENCOUNTER — Ambulatory Visit: Payer: Medicare Other | Admitting: *Deleted

## 2013-08-07 ENCOUNTER — Ambulatory Visit: Payer: Self-pay

## 2013-08-07 VITALS — BP 149/86 | HR 98 | Temp 98.2°F | Resp 16

## 2013-08-07 DIAGNOSIS — Z9889 Other specified postprocedural states: Secondary | ICD-10-CM

## 2013-08-07 NOTE — Progress Notes (Signed)
   Subjective:    Patient ID: Lacey Meyer, female    DOB: 1960/07/17, 53 y.o.   MRN: [id number]  HPI  Pt seen today for status post op 3 weeks. DOS 07/17/13. Pt is afebrile, surgical wounds are aligned and no redness or drainage noted, mild swelling noted, pt admits to being on her feet all day.  Admits to slight pain in right foot, but is tolerable, she is in a Darco shoe, dressing changed and surgi-compression stocking applied.  Xrays aquired. Advised pt to ice and elevate and remain mobile as tolerated. She is to see Dr. Al Corpus in 3 weeks to review xrays and receive further instructions.   Review of Systems     Objective:   Physical Exam        Assessment & Plan:

## 2013-08-26 ENCOUNTER — Encounter: Payer: Self-pay | Admitting: Podiatry

## 2013-08-26 NOTE — Progress Notes (Signed)
Dr. Al CorpusHyatt performed a right Aiken osteotomy and right dorsal tarsal exostectomy on 07/17/2013. Percocet 10/325mg  #50 1-2 tabs Q6-8 hours prn pain. Phenergan 25mg  #30 1 tab Q6-8 hours prn nausea. Clindamycin 150mg  #30 1 tab TID

## 2013-08-27 ENCOUNTER — Ambulatory Visit (INDEPENDENT_AMBULATORY_CARE_PROVIDER_SITE_OTHER): Payer: Medicare Other | Admitting: Podiatry

## 2013-08-27 ENCOUNTER — Ambulatory Visit (INDEPENDENT_AMBULATORY_CARE_PROVIDER_SITE_OTHER): Payer: Medicare Other

## 2013-08-27 ENCOUNTER — Encounter: Payer: Self-pay | Admitting: Podiatry

## 2013-08-27 VITALS — BP 144/84 | HR 94 | Resp 16 | Ht 66.5 in | Wt 147.0 lb

## 2013-08-27 DIAGNOSIS — M21611 Bunion of right foot: Secondary | ICD-10-CM

## 2013-08-27 DIAGNOSIS — M21619 Bunion of unspecified foot: Secondary | ICD-10-CM

## 2013-08-27 NOTE — Progress Notes (Signed)
She presents today for her 6 week postop visit status post Akin osteotomy right foot. She states that she's been moving refrigerators and stoves and washing her foot regularly..  Objective: Vital signs stable she is alert and oriented x3. She has mild edema about the first metatarsophalangeal joint and on the proximal phalanx of the hallux right. It is nontender on palpation. Radiographic evaluation demonstrates a shift in the staple but the osteotomy appears to be in good position.  Assessment: Well-healing surgical toe right.  Plan: Continue with the compression heads continue with the Darco shoe x2 weeks and I will followup with her at that point.

## 2013-09-10 ENCOUNTER — Ambulatory Visit (INDEPENDENT_AMBULATORY_CARE_PROVIDER_SITE_OTHER): Payer: Medicare Other | Admitting: Podiatry

## 2013-09-10 ENCOUNTER — Ambulatory Visit (INDEPENDENT_AMBULATORY_CARE_PROVIDER_SITE_OTHER): Payer: Medicare Other

## 2013-09-10 DIAGNOSIS — S92911A Unspecified fracture of right toe(s), initial encounter for closed fracture: Secondary | ICD-10-CM

## 2013-09-10 DIAGNOSIS — Z9889 Other specified postprocedural states: Secondary | ICD-10-CM

## 2013-09-10 DIAGNOSIS — S92919A Unspecified fracture of unspecified toe(s), initial encounter for closed fracture: Secondary | ICD-10-CM

## 2013-09-10 NOTE — Progress Notes (Signed)
She presents today with her husband stating that she had her toe and it has been a little achy. She states that she's been up on her foot without her shoes cleaning her house and clean her bathroom is. She states that no one nails look for her so she has to do it.  Objective: Vital signs are stable she is alert and oriented x3. The hallux right demonstrates mild edema no erythema saline is drainage or odor. Mild malleus is noted. We have loss of correction of the hallux interphalangeal surgery there was performed initially. Radiographic evaluation demonstrates a near complete avulsion of her osteotomy site and stapled.  Assessment: Fractured hallux right status post [place] Akin bunion repair  Plan: Continue to wear the Darco shoe will followup with me in the next few weeks.

## 2013-10-08 ENCOUNTER — Ambulatory Visit (INDEPENDENT_AMBULATORY_CARE_PROVIDER_SITE_OTHER): Payer: Medicare Other | Admitting: Podiatry

## 2013-10-08 ENCOUNTER — Ambulatory Visit (INDEPENDENT_AMBULATORY_CARE_PROVIDER_SITE_OTHER): Payer: Medicare Other

## 2013-10-08 ENCOUNTER — Encounter: Payer: Self-pay | Admitting: Podiatry

## 2013-10-08 VITALS — BP 147/80 | HR 110 | Resp 16

## 2013-10-08 DIAGNOSIS — IMO0001 Reserved for inherently not codable concepts without codable children: Secondary | ICD-10-CM

## 2013-10-08 DIAGNOSIS — S92911D Unspecified fracture of right toe(s), subsequent encounter for fracture with routine healing: Secondary | ICD-10-CM

## 2013-10-08 NOTE — Progress Notes (Signed)
She presents today for followup of her Akin osteotomy right foot. She denies fever chills nausea vomiting muscle aches and pains. States she's been working out with a Psychologist, educationaltrainer and has been exercising on this foot heavily.  Objective: Vital signs are stable she is alert and oriented x3 she has no pain on palpation first metatarsophalangeal joint or the proximal phalanx. Radiographic evaluation demonstrates a well-healed Akin osteotomy with a now placed staple.  Assessment: Well-healing surgical toe right.  Plan: Followup with her as needed back into her regular shoe gear and continued activity.

## 2013-11-03 ENCOUNTER — Telehealth: Payer: Self-pay | Admitting: *Deleted

## 2013-11-03 NOTE — Telephone Encounter (Signed)
I'm a patient of Dr. Al Corpus.  I need you to fax a prescription to Dr. Almeta Monas for Diabetic Shoes so he can approve it.  He had asked me about it and I forgot to ask Dr. Al Corpus when I saw him last.  Do that for me and I would greatly appreciate it.

## 2013-11-04 NOTE — Telephone Encounter (Signed)
That will be fine.  I think she is a [facility] patient.  I would appreciate it if one of the [facility] assistants would take care of this ASAP.  Thanks.

## 2013-11-04 NOTE — Telephone Encounter (Signed)
If she is not a [facility] patient then please ask Morrie Sheldon to take care of it Delydia.  Thanks

## 2013-11-06 NOTE — Telephone Encounter (Signed)
I called and informed her that Dr. Maren Beach [place] to start the process for the shoes.  We have a company called [employer] that we use that will send the paperwork to Dr. Concepcion Elk.  I asked her if he treats her for Diabetes.  She stated yes.  I told her will get the process started, once he authorizes we will call you to come in for a shoe measurement.  She stated okay thank you.

## 2013-12-11 ENCOUNTER — Ambulatory Visit (INDEPENDENT_AMBULATORY_CARE_PROVIDER_SITE_OTHER): Payer: Medicare Other | Admitting: *Deleted

## 2013-12-11 DIAGNOSIS — E1140 Type 2 diabetes mellitus with diabetic neuropathy, unspecified: Secondary | ICD-10-CM

## 2013-12-11 NOTE — Progress Notes (Signed)
Measured for diabetic shoes and insoles. 

## 2013-12-11 NOTE — Patient Instructions (Signed)
Our office will notify you once your diabetic shoes and insoles arrive. At that time an appointment will be needed to pick them up.  

## 2013-12-24 ENCOUNTER — Encounter: Payer: Self-pay | Admitting: Podiatry

## 2013-12-24 ENCOUNTER — Ambulatory Visit (INDEPENDENT_AMBULATORY_CARE_PROVIDER_SITE_OTHER): Payer: Medicare Other | Admitting: Podiatry

## 2013-12-24 ENCOUNTER — Ambulatory Visit (INDEPENDENT_AMBULATORY_CARE_PROVIDER_SITE_OTHER): Payer: Medicare Other

## 2013-12-24 VITALS — BP 168/88 | HR 83 | Resp 16

## 2013-12-24 DIAGNOSIS — M21611 Bunion of right foot: Secondary | ICD-10-CM

## 2013-12-24 DIAGNOSIS — M2011 Hallux valgus (acquired), right foot: Secondary | ICD-10-CM

## 2013-12-24 DIAGNOSIS — M779 Enthesopathy, unspecified: Secondary | ICD-10-CM

## 2013-12-24 MED ORDER — DICLOFENAC SODIUM 75 MG PO TBEC: 75.0000 mg | DELAYED_RELEASE_TABLET | Freq: Two times a day (BID) | ORAL | Status: DC

## 2013-12-24 NOTE — Progress Notes (Signed)
   Subjective:    Patient ID: Andreas BlowerFrances Y Cleavenger, female    DOB: 04/17/1960, 53 y.o.   MRN: [id number]  HPI Comments: DOS 07/17/2013 right aiken osteotomy, and dorsal tarsal exostectomy.     Review of Systems     Objective:   Physical Exam: I have reviewed her past medical history medications and allergies. Pulses are strongly palpable bilateral. Right foot demonstrates large nonpulsatile mass to the dorsal aspect of the right foot where a dorsal tarsal exostectomy has been performed. The mass appears to be nonpulsatile in nature but firm the plantar tissue. She also has pain on palpation and range of motion of the first metatarsophalangeal joint on the contralateral foot. All pulses are normal. The range of motion of the first metatarsophalangeal joint of the right foot is normal for she had an [place]ride Akin osteotomy performed. Radiographic evaluation of the right foot demonstrates a soft tissue mass beneath the dorsal tarsal exostectomy incision site which is more than likely scar tissue or ganglion cyst.        Assessment & Plan:  Assessment: Ganglion cyst or retaining fluid dorsal aspect of the right foot. Capsulitis first metatarsophalangeal joint left foot.  Plan: Discussed etiology pathology conservative versus surgical therapies. I injected the mass the dorsal aspect of the foot with Kenalog and local anesthetic. I injected the first metatarsophalangeal joint after sterile Betadine skin with dexamethasone and local anesthetic. I will followup with her in 4 weeks. She will continue her anti-inflammatories.

## 2014-01-21 ENCOUNTER — Ambulatory Visit (INDEPENDENT_AMBULATORY_CARE_PROVIDER_SITE_OTHER): Payer: Medicare Other

## 2014-01-21 ENCOUNTER — Ambulatory Visit (INDEPENDENT_AMBULATORY_CARE_PROVIDER_SITE_OTHER): Payer: Medicare Other | Admitting: Podiatry

## 2014-01-21 ENCOUNTER — Encounter: Payer: Self-pay | Admitting: Podiatry

## 2014-01-21 VITALS — BP 150/78 | HR 99 | Resp 15

## 2014-01-21 DIAGNOSIS — M21611 Bunion of right foot: Secondary | ICD-10-CM

## 2014-01-21 DIAGNOSIS — M2011 Hallux valgus (acquired), right foot: Secondary | ICD-10-CM

## 2014-01-21 DIAGNOSIS — M79673 Pain in unspecified foot: Secondary | ICD-10-CM

## 2014-01-21 DIAGNOSIS — E1140 Type 2 diabetes mellitus with diabetic neuropathy, unspecified: Secondary | ICD-10-CM

## 2014-01-21 DIAGNOSIS — M76822 Posterior tibial tendinitis, left leg: Secondary | ICD-10-CM

## 2014-01-21 NOTE — Progress Notes (Signed)
   Subjective:    Patient ID: Lacey Meyer, female    DOB: 02/13/1961, 53 y.o.   MRN: [id number]  HPI Comments: DOS 07/17/2013 Right aiken osteotomy, and dorsal tarsal exostectomy.  Pt is fitted with Shon BatonBrooks Glycerin 13 120/197 size 10.5 D and custom molded diabetic inserts.  Oral and printed diabetic shoe wearing instruction are given.  Pt states the shoes and inserts feel good.     Review of Systems     Objective:   Physical Exam: She presents today to pick up her diabetic shoes. She is also following up for an injection to the dorsal aspect of the right foot. And she is concerned about the bunion deformity to her left foot. Pulses are strongly palpable bilateral. Neurologic sensorium is intact. She has a rectus foot right with the decrease in edema overlying the dorsal aspect of the right foot secondary to the injection that we applied last time she was in. Her left foot does demonstrate mild bunion deformity with increase in hallux interphalangeal. 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  Plan: Discussed etiology pathology conservative versus surgical therapies. At this point we are going to consent her for surgical intervention consisting of an [facility] with screw fixation and the hallux interphalangeal osteotomy area at this point we went over the consent line by line number number giving her ample time to ask questions she softer regarding Austin bunion repair and Akin osteotomy. I answered them to the best of my ability in layman's terms. We did discuss the possible postop complications which may include but are not limited to postop pain bleeding swelling infection need for further surgery loss of digit loss limb also live.  She understands and is amenable to it. I will follow-up with her in the near future for her.

## 2014-01-21 NOTE — Patient Instructions (Signed)

## 2014-01-30 ENCOUNTER — Encounter (HOSPITAL_COMMUNITY): Payer: Self-pay | Admitting: *Deleted

## 2014-01-30 ENCOUNTER — Emergency Department (INDEPENDENT_AMBULATORY_CARE_PROVIDER_SITE_OTHER)
Admission: EM | Admit: 2014-01-30 | Discharge: 2014-01-30 | Disposition: A | Discharge status: Home / Self Care | Payer: Medicare Other | Source: Home / Self Care | Attending: Emergency Medicine | Admitting: Emergency Medicine

## 2014-01-30 DIAGNOSIS — L259 Unspecified contact dermatitis, unspecified cause: Secondary | ICD-10-CM

## 2014-01-30 NOTE — Discharge Instructions (Signed)

## 2014-01-30 NOTE — ED Notes (Signed)
Reports using Blistex and Carmex yesterday; suddenly felt pruritis, swelling, and irritation.  Took 50mg  Benadryl last night with improved swelling & relief of itching.  Lips now peeling.

## 2014-01-30 NOTE — ED Provider Notes (Signed)
CSN: [id number]     Arrival date & time 01/30/14  [id number] History   First MD Initiated Contact with Patient 01/30/14 1000     Chief Complaint  Patient presents with  . Oral Swelling   (Consider location/radiation/quality/duration/timing/severity/associated sxs/prior Treatment) HPI Comments: Patient states that she noticed her lips were dry and cracked yesterday and she applied some Blistex and developed tingling sensation and lip swelling. Took some oral Benadryl and symptoms began to improve. She then switched to using Carmex for chapped lips and developed similar symptoms.  States symptoms have improved today following discontinuation of both products and use of oral benadryl No reports of difficulty breathing, speaking or swallowing. No oral lesions No hives  The history is provided by the patient.    Past Medical History  Diagnosis Date  . HTN (hypertension)   . Anemia   . Gout   . Impaired vision    Past Surgical History  Procedure Laterality Date  . Shoulder arthroscopy w/ rotator cuff repair    . Btl    . Eye surgery    . Foot surgery     Family History  Problem Relation Age of Onset  . Hypertension Mother   . Hypertension Father    History  Substance Use Topics  . Smoking status: Former Smoker    Quit date: 03/06/2010  . Smokeless tobacco: Never Used  . Alcohol Use: No   OB History    No data available     Review of Systems  All other systems reviewed and are negative.   Allergies  Ampicillin; Doxycycline; and Other  Home Medications   Prior to Admission medications   Medication Sig Start Date End Date Taking? Authorizing Provider  allopurinol (ZYLOPRIM) 300 MG tablet Take 1 tablet (300 mg total) by mouth daily. 10/03/11  Yes Elvina SidleKurt Lauenstein, MD  AMLODIPINE BESYLATE PO Take by mouth.   Yes Historical Provider, MD  BENAZEPRIL HCL PO Take by mouth.   Yes Historical Provider, MD  colchicine 0.6 MG tablet Take 1 tablet (0.6 mg total) by mouth 2 (two)  times daily. 10/07/11  Yes Morrell RiddleSarah L Weber, PA-C  diclofenac (VOLTAREN) 75 MG EC tablet Take 1 tablet (75 mg total) by mouth 2 (two) times daily. 12/24/13  Yes Max T Hyatt, DPM  DICLOFENAC PO Take by mouth.   Yes Historical Provider, MD  Empagliflozin (JARDIANCE PO) Take by mouth.   Yes Historical Provider, MD  METFORMIN HCL PO Take by mouth.   Yes Historical Provider, MD  pregabalin (LYRICA) 75 MG capsule Take 75 mg by mouth 2 (two) times daily.   Yes Historical Provider, MD  simvastatin (ZOCOR) 20 MG tablet  06/01/13  Yes Historical Provider, MD  ALPRAZolam (XANAX) 0.25 MG tablet Take 1 tablet (0.25 mg total) by mouth 2 (two) times daily. For anxiety 10/03/11   Elvina SidleKurt Lauenstein, MD  gabapentin (NEURONTIN) 300 MG capsule Take 1 capsule (300 mg total) by mouth at bedtime. 10/03/11 10/02/12  Elvina SidleKurt Lauenstein, MD  lisinopril (PRINIVIL,ZESTRIL) 20 MG tablet Take 1 tablet (20 mg total) by mouth daily. 10/03/11   Elvina SidleKurt Lauenstein, MD  metoprolol (LOPRESSOR) 50 MG tablet Take 1 tablet (50 mg total) by mouth 2 (two) times daily. 10/03/11   Elvina SidleKurt Lauenstein, MD  potassium chloride SA (K-DUR,KLOR-CON) 20 MEQ tablet Take 1 tablet (20 mEq total) by mouth daily. 10/04/11 10/03/12  Elvina SidleKurt Lauenstein, MD  spironolactone (ALDACTONE) 25 MG tablet Take 1 tablet (25 mg total) by mouth daily. 10/03/11 10/02/12  Kenyon AnaKurt  Lauenstein, MD  traMADol (ULTRAM) 50 MG tablet Take 50 mg by mouth every 8 (eight) hours as needed. For pain    Historical Provider, MD   BP 171/110 mmHg  Pulse 104  Temp(Src) 97.5 F (36.4 C) (Oral)  Resp 20  SpO2 98% Physical Exam  Constitutional: She is oriented to person, place, and time. She appears well-developed and well-nourished. No distress.  HENT:  Head: Normocephalic and atraumatic.  Mouth/Throat: Oropharynx is clear and moist.  Eyes: Conjunctivae are normal. No scleral icterus.  Cardiovascular: Normal rate, regular rhythm and normal heart sounds.   Pulmonary/Chest: Effort normal and breath sounds  normal. No respiratory distress. She has no wheezes.  Musculoskeletal: Normal range of motion.  Neurological: She is alert and oriented to person, place, and time.  Skin: Skin is warm and dry.  Lips dry and cracked. No lip or oropharyngeal swelling  Psychiatric: She has a normal mood and affect. Her behavior is normal.  Nursing note and vitals reviewed.   ED Course  Procedures (including critical care time) Labs Review Labs Reviewed - No data to display  Imaging Review No results found.   MDM   1. Contact dermatitis   I suspect she may be reacting to camphor in both products and advised her to switch to plain petroleum jelly to moisturize her lips. Benadryl prn. Voices understanding that is symptoms become suddenly worse or severe, she should report to her nearest emergency room.    Ria ClockJennifer Lee H Katerina Zurn, [place] 01/30/14 1043

## 2014-02-02 ENCOUNTER — Encounter (HOSPITAL_COMMUNITY): Payer: Self-pay | Admitting: Emergency Medicine

## 2014-02-02 ENCOUNTER — Emergency Department (INDEPENDENT_AMBULATORY_CARE_PROVIDER_SITE_OTHER)
Admission: EM | Admit: 2014-02-02 | Discharge: 2014-02-02 | Disposition: A | Discharge status: Home / Self Care | Payer: Medicare Other | Source: Home / Self Care | Attending: Emergency Medicine | Admitting: Emergency Medicine

## 2014-02-02 DIAGNOSIS — B001 Herpesviral vesicular dermatitis: Secondary | ICD-10-CM

## 2014-02-02 MED ORDER — VALACYCLOVIR HCL 1 G PO TABS: 1000.0000 mg | ORAL_TABLET | Freq: Two times a day (BID) | ORAL | Status: AC

## 2014-02-02 NOTE — ED Provider Notes (Signed)
CSN: [id number]     Arrival date & time 02/02/14  1916 History   First MD Initiated Contact with Patient 02/02/14 1922     Chief Complaint  Patient presents with  . Rash   (Consider location/radiation/quality/duration/timing/severity/associated sxs/prior Treatment) HPI  She is a 53 year old woman here for evaluation of lip complaint. She was seen for this over the weekend and diagnosed with contact dermatitis likely secondary to camp floor. She states the symptoms started Thursday night with, the tingling in the corner of her lip. She used Blistex and Carmax which seemed to make it worse. She also tried Abreva. Since Saturday, she has not been using anything except water on her lips. She states the redness and swelling has not resolved, and now she has some spots at the corners of her mouth. She reports some burning and itching of the lesions.  No sore throat, fevers, intraoral lesions.  Past Medical History  Diagnosis Date  . HTN (hypertension)   . Anemia   . Gout   . Impaired vision    Past Surgical History  Procedure Laterality Date  . Shoulder arthroscopy w/ rotator cuff repair    . Btl    . Eye surgery    . Foot surgery     Family History  Problem Relation Age of Onset  . Hypertension Mother   . Hypertension Father    History  Substance Use Topics  . Smoking status: Former Smoker    Quit date: 03/06/2010  . Smokeless tobacco: Never Used  . Alcohol Use: No   OB History    No data available     Review of Systems As in history of present illness Allergies  Ampicillin; Doxycycline; and Other  Home Medications   Prior to Admission medications   Medication Sig Start Date End Date Taking? Authorizing Provider  allopurinol (ZYLOPRIM) 300 MG tablet Take 1 tablet (300 mg total) by mouth daily. 10/03/11   Elvina SidleKurt Lauenstein, MD  ALPRAZolam Prudy Feeler(XANAX) 0.25 MG tablet Take 1 tablet (0.25 mg total) by mouth 2 (two) times daily. For anxiety 10/03/11   Elvina SidleKurt Lauenstein, MD  AMLODIPINE  BESYLATE PO Take by mouth.    Historical Provider, MD  BENAZEPRIL HCL PO Take by mouth.    Historical Provider, MD  colchicine 0.6 MG tablet Take 1 tablet (0.6 mg total) by mouth 2 (two) times daily. 10/07/11   Morrell RiddleSarah L Weber, PA-C  diclofenac (VOLTAREN) 75 MG EC tablet Take 1 tablet (75 mg total) by mouth 2 (two) times daily. 12/24/13   Max T Hyatt, DPM  DICLOFENAC PO Take by mouth.    Historical Provider, MD  Empagliflozin (JARDIANCE PO) Take by mouth.    Historical Provider, MD  gabapentin (NEURONTIN) 300 MG capsule Take 1 capsule (300 mg total) by mouth at bedtime. 10/03/11 10/02/12  Elvina SidleKurt Lauenstein, MD  lisinopril (PRINIVIL,ZESTRIL) 20 MG tablet Take 1 tablet (20 mg total) by mouth daily. 10/03/11   Elvina SidleKurt Lauenstein, MD  METFORMIN HCL PO Take by mouth.    Historical Provider, MD  metoprolol (LOPRESSOR) 50 MG tablet Take 1 tablet (50 mg total) by mouth 2 (two) times daily. 10/03/11   Elvina SidleKurt Lauenstein, MD  potassium chloride SA (K-DUR,KLOR-CON) 20 MEQ tablet Take 1 tablet (20 mEq total) by mouth daily. 10/04/11 10/03/12  Elvina SidleKurt Lauenstein, MD  pregabalin (LYRICA) 75 MG capsule Take 75 mg by mouth 2 (two) times daily.    Historical Provider, MD  simvastatin (ZOCOR) 20 MG tablet  06/01/13   Historical Provider,  MD  spironolactone (ALDACTONE) 25 MG tablet Take 1 tablet (25 mg total) by mouth daily. 10/03/11 10/02/12  Elvina SidleKurt Lauenstein, MD  traMADol (ULTRAM) 50 MG tablet Take 50 mg by mouth every 8 (eight) hours as needed. For pain    Historical Provider, MD  valACYclovir (VALTREX) 1000 MG tablet Take 1 tablet (1,000 mg total) by mouth 2 (two) times daily. 02/02/14 02/16/14  Charm RingsErin J Honig, MD   BP 141/96 mmHg  Pulse 103  Temp(Src) 97.9 F (36.6 C) (Oral)  Resp 20  SpO2 100% Physical Exam  Constitutional: She is oriented to person, place, and time. She appears well-developed and well-nourished. No distress.  Cardiovascular:  Mild tachycardia, but stable  Pulmonary/Chest: Effort normal.  Neurological: She is  alert and oriented to person, place, and time.  Skin:  Upper and lower lips are swollen and erythematous.  Several papular lesions at the corners of her mouth.  No mucosal lesions.    ED Course  Procedures (including critical care time) Labs Review Labs Reviewed - No data to display  Imaging Review No results found.   MDM   1. Fever blister    Given persistence of her symptoms, I suspect this is a primary herpetic outbreak, rather than contact dermatitis. We'll treat with Valtrex 1 g twice a day 10 days. Okay to continue to use Benadryl for symptomatic relief. Discussed not sharing water glasses or exchanging kisses over the holidays. Follow-up as needed.    Charm RingsErin J Honig, MD 02/02/14 2011

## 2014-02-02 NOTE — ED Notes (Signed)
53 year old female seen here this past Saturday 01/30/2014  Was using carmex, abriva, and  Blistex.  Had a reaction with rash around lips.  Since she was seen here on !04/01/13 she has been using nothing around her mouth except some Vaseline.

## 2014-02-02 NOTE — Discharge Instructions (Signed)
Take Valtrex 1 pill twice a day for 10 days. You can use benadryl as needed for itching. Do not share glasses or kiss anyone until the lesions resolve. You should see improvement by Saturday.

## 2014-02-26 ENCOUNTER — Telehealth: Payer: Self-pay | Admitting: *Deleted

## 2014-02-26 NOTE — Telephone Encounter (Signed)
Pt states she has to reschedule her appt.

## 2014-03-01 NOTE — Telephone Encounter (Signed)
I returned her call and left a message in regards to rescheduling surgery with Dr. Al CorpusHyatt.

## 2014-03-10 ENCOUNTER — Telehealth: Payer: Self-pay | Admitting: *Deleted

## 2014-03-10 NOTE — Telephone Encounter (Signed)
I called and informed Aram BeechamCynthia that the patient wants to reschedule to January 15th.  "Can you send me the paperwork, we don't have anything on her."  I called and informed Aram BeechamCynthia that Dr. Al CorpusHyatt has the [place] with [place] in [place].  "Okay that's fine just fax it when you get it."

## 2014-03-10 NOTE — Telephone Encounter (Signed)
I'm returning your call.  She had called about rescheduling your surgery.  "Yes, I can't do it that day.  I had some family matters that came up.  I can do it after the 9th of January."  He can do it on the 15th.  "That's good."  Okay I will reschedule it and let Dr. Al CorpusHyatt know.

## 2014-03-11 ENCOUNTER — Encounter: Payer: Self-pay | Admitting: Podiatry

## 2014-03-17 NOTE — Progress Notes (Signed)
Dr Al CorpusHyatt performed a left Aiken osteotomy and left [place] bunion[place] on 12/31.15

## 2014-03-18 ENCOUNTER — Encounter: Payer: Self-pay | Admitting: Podiatry

## 2014-03-25 ENCOUNTER — Other Ambulatory Visit: Payer: Self-pay | Admitting: Podiatry

## 2014-03-25 MED ORDER — OXYCODONE-ACETAMINOPHEN 10-325 MG PO TABS: ORAL_TABLET | ORAL | Status: DC

## 2014-03-25 MED ORDER — CLINDAMYCIN HCL 150 MG PO CAPS: 150.0000 mg | ORAL_CAPSULE | Freq: Three times a day (TID) | ORAL | Status: DC

## 2014-03-25 MED ORDER — PROMETHAZINE HCL 25 MG PO TABS: 25.0000 mg | ORAL_TABLET | Freq: Three times a day (TID) | ORAL | Status: DC | PRN

## 2014-03-26 DIAGNOSIS — M2012 Hallux valgus (acquired), left foot: Secondary | ICD-10-CM

## 2014-03-26 DIAGNOSIS — M779 Enthesopathy, unspecified: Secondary | ICD-10-CM | POA: Diagnosis not present

## 2014-03-28 ENCOUNTER — Other Ambulatory Visit: Payer: Self-pay | Admitting: Podiatry

## 2014-03-28 MED ORDER — PROMETHAZINE HCL 25 MG PO TABS: 25.0000 mg | ORAL_TABLET | Freq: Three times a day (TID) | ORAL | Status: DC | PRN

## 2014-03-29 ENCOUNTER — Telehealth: Payer: Self-pay | Admitting: *Deleted

## 2014-03-29 NOTE — Telephone Encounter (Signed)
Pt states the Phenergan suppositories cost $150.00 and her insurance doesn't cover, and request Phenergan as a pill.  This message was taken off 03/29/2014, and filled 03/28/2014 by Dr. Al CorpusHyatt.

## 2014-03-30 NOTE — Progress Notes (Signed)
(  Rescheduled to 03/26/2014)    DOS 03/11/2014 left aiken osteotomy, left aust[place] bunionectomy with screw

## 2014-04-01 ENCOUNTER — Other Ambulatory Visit: Payer: Self-pay | Admitting: Podiatry

## 2014-04-01 ENCOUNTER — Ambulatory Visit (INDEPENDENT_AMBULATORY_CARE_PROVIDER_SITE_OTHER): Payer: Medicare Other

## 2014-04-01 ENCOUNTER — Ambulatory Visit (INDEPENDENT_AMBULATORY_CARE_PROVIDER_SITE_OTHER): Payer: Medicare Other | Admitting: Podiatry

## 2014-04-01 VITALS — BP 127/68 | HR 81 | Temp 96.9°F | Resp 16

## 2014-04-01 DIAGNOSIS — E1140 Type 2 diabetes mellitus with diabetic neuropathy, unspecified: Secondary | ICD-10-CM

## 2014-04-01 DIAGNOSIS — M21611 Bunion of right foot: Secondary | ICD-10-CM

## 2014-04-01 DIAGNOSIS — M2011 Hallux valgus (acquired), right foot: Secondary | ICD-10-CM

## 2014-04-01 DIAGNOSIS — Z9889 Other specified postprocedural states: Secondary | ICD-10-CM

## 2014-04-01 NOTE — Progress Notes (Signed)
She is one-week status post [place] Akin osteotomy left foot. She denies fever chills nausea vomiting muscle aches and pains.  Objective: Dry sterile dressing was intact that she presents today in her Cam Walker. Once removed demonstrates no erythema and mild edema no cellulitis drainage or odor. Our pins are well coapted. She has good range of motion about the first metatarsophalangeal joint.  Assessment: Healing surgical foot. One week status post [place] Akin osteotomy.  Plan: [place] today dry sterile compressive dressing follow-up with her in 1 week.

## 2014-04-15 ENCOUNTER — Encounter: Payer: Self-pay | Admitting: Podiatry

## 2014-04-15 ENCOUNTER — Ambulatory Visit (INDEPENDENT_AMBULATORY_CARE_PROVIDER_SITE_OTHER): Payer: Medicare Other

## 2014-04-15 ENCOUNTER — Ambulatory Visit (INDEPENDENT_AMBULATORY_CARE_PROVIDER_SITE_OTHER): Payer: Medicare Other | Admitting: Podiatry

## 2014-04-15 VITALS — BP 129/75 | HR 102 | Temp 97.3°F | Resp 16

## 2014-04-15 DIAGNOSIS — G8918 Other acute postprocedural pain: Secondary | ICD-10-CM | POA: Diagnosis not present

## 2014-04-15 DIAGNOSIS — Z9889 Other specified postprocedural states: Secondary | ICD-10-CM

## 2014-04-15 NOTE — Progress Notes (Signed)
She presents today for follow-up of her [place] Akin osteotomy left foot. She states that she has recently dropped a portion of the bed on her foot which caused severe pain. Other than that she says the foot seems to be doing okay.  Objective: Vital signs are stable she is alert and oriented 3 mild edema about the hallux left and mild dehiscence of the wound distally but does not appear to be infected. Radiographic evaluation confirms well-healing Akin [place] osteotomy left. No signs of infection.  Assessment: Well-healing surgical foot left.  Plan: Encouraged her to walk with her Darco shoe and to start soaking the foot in Epsom salts and warm water. White signs and symptoms of infection if should any should arise she's notify us immediately.

## 2014-04-29 ENCOUNTER — Ambulatory Visit (INDEPENDENT_AMBULATORY_CARE_PROVIDER_SITE_OTHER): Payer: Medicare Other

## 2014-04-29 ENCOUNTER — Ambulatory Visit (INDEPENDENT_AMBULATORY_CARE_PROVIDER_SITE_OTHER): Payer: Medicare Other | Admitting: Podiatry

## 2014-04-29 DIAGNOSIS — Z9889 Other specified postprocedural states: Secondary | ICD-10-CM

## 2014-04-29 DIAGNOSIS — M2012 Hallux valgus (acquired), left foot: Secondary | ICD-10-CM

## 2014-04-29 DIAGNOSIS — M21612 Bunion of left foot: Secondary | ICD-10-CM

## 2014-04-29 NOTE — Progress Notes (Signed)
She presents today one month status [facility] with Akin osteotomy. She states that most recently she has fallen down steps but does not think that she injured the toe.  Objective: Vital signs are stable she is alert and oriented 3. The toe does not demonstrate any erythema cellulitis drainage or odor is mild edema. She has a good range of motion without pain. Radiographic evaluation does demonstrate the capital osteotomy being slightly dislodged dorsally possibly 6 associated with the fall or ambulating without her shoe. She does percent with her Darco shoe today.  Assessment: Well-healing surgical foot with the exception of dorsal displacement of the capital osteotomy minimally.  Plan: Encouraged her to wear the Darco shoe for at least another 2-3 weeks and I'll follow-up with her in 1 month.

## 2014-05-08 ENCOUNTER — Other Ambulatory Visit: Payer: Self-pay | Admitting: Podiatry

## 2014-05-27 ENCOUNTER — Ambulatory Visit (INDEPENDENT_AMBULATORY_CARE_PROVIDER_SITE_OTHER): Payer: Medicare Other

## 2014-05-27 ENCOUNTER — Encounter: Payer: Self-pay | Admitting: Podiatry

## 2014-05-27 ENCOUNTER — Ambulatory Visit (INDEPENDENT_AMBULATORY_CARE_PROVIDER_SITE_OTHER): Payer: Medicare Other | Admitting: Podiatry

## 2014-05-27 VITALS — BP 144/83 | HR 94

## 2014-05-27 DIAGNOSIS — B351 Tinea unguium: Secondary | ICD-10-CM | POA: Diagnosis not present

## 2014-05-27 DIAGNOSIS — Z9889 Other specified postprocedural states: Secondary | ICD-10-CM

## 2014-05-27 DIAGNOSIS — M79673 Pain in unspecified foot: Secondary | ICD-10-CM

## 2014-05-27 DIAGNOSIS — M2012 Hallux valgus (acquired), left foot: Secondary | ICD-10-CM

## 2014-05-27 NOTE — Progress Notes (Signed)
She presents today for follow-up of her bunion repair left foot she denies fever chills nausea vomiting muscle aches and pains. She does state that her painful toenails are starting to bother her.  Objective: Vital signs are stable she is alert and oriented 3 she has mild edema overlying the first intermetatarsal space of the left foot but good range of motion. Radiographic evaluation does demonstrate some movement of the head of the first metatarsal resulting in a secondary bone healing but it appears to be stable. The Akin appears to be 100% healed. Her nails are thick yellow dystrophic onychomycotic and painful palpation.  Assessment: Pain in limb secondary to onychomycosis. Well-healing surgical foot left.  Plan: Follow up with her in 2 months at which time another x-ray will be taken for her left foot and we will debride her nails again if necessary.

## 2014-07-29 ENCOUNTER — Encounter: Payer: Self-pay | Admitting: Podiatry

## 2014-07-29 ENCOUNTER — Ambulatory Visit (INDEPENDENT_AMBULATORY_CARE_PROVIDER_SITE_OTHER): Payer: Medicare Other

## 2014-07-29 ENCOUNTER — Ambulatory Visit (INDEPENDENT_AMBULATORY_CARE_PROVIDER_SITE_OTHER): Payer: Medicare Other | Admitting: Podiatry

## 2014-07-29 VITALS — BP 164/88 | HR 101 | Resp 16

## 2014-07-29 DIAGNOSIS — M2012 Hallux valgus (acquired), left foot: Secondary | ICD-10-CM | POA: Diagnosis not present

## 2014-07-29 DIAGNOSIS — Z9889 Other specified postprocedural states: Secondary | ICD-10-CM

## 2014-07-29 MED ORDER — TRAMADOL HCL 50 MG PO TABS: 50.0000 mg | ORAL_TABLET | Freq: Three times a day (TID) | ORAL | Status: DC | PRN

## 2014-07-31 NOTE — Progress Notes (Signed)
She presents today for follow-up of an Lacey Meyer osteotomy left foot performed in January 2016. She states it seems to be doing great she has no problems whatsoever. It only bothers her when it rains.  Objective: Vital signs are stable she is alert and oriented 3. She has no tenderness on range of motion today she has no tenderness on palpation of the first metatarsophalangeal joint or of the toe itself. There is no calf pain and pulses remain palpable. Radiograph confirms well-healed osteotomies.  Assessment: Well-healed surgical foot.  Plan: Prescription for tramadol and will follow up with her in 2 months

## 2014-09-14 ENCOUNTER — Encounter: Payer: Self-pay | Admitting: Podiatry

## 2014-09-14 ENCOUNTER — Ambulatory Visit (INDEPENDENT_AMBULATORY_CARE_PROVIDER_SITE_OTHER): Payer: Medicare Other

## 2014-09-14 ENCOUNTER — Ambulatory Visit (INDEPENDENT_AMBULATORY_CARE_PROVIDER_SITE_OTHER): Payer: Medicare Other | Admitting: Podiatry

## 2014-09-14 DIAGNOSIS — Z9889 Other specified postprocedural states: Secondary | ICD-10-CM

## 2014-09-14 DIAGNOSIS — M779 Enthesopathy, unspecified: Secondary | ICD-10-CM | POA: Diagnosis not present

## 2014-09-14 DIAGNOSIS — M19079 Primary osteoarthritis, unspecified ankle and foot: Secondary | ICD-10-CM

## 2014-09-14 DIAGNOSIS — M129 Arthropathy, unspecified: Secondary | ICD-10-CM | POA: Diagnosis not present

## 2014-09-14 DIAGNOSIS — M2012 Hallux valgus (acquired), left foot: Secondary | ICD-10-CM | POA: Diagnosis not present

## 2014-09-14 DIAGNOSIS — M76822 Posterior tibial tendinitis, left leg: Secondary | ICD-10-CM

## 2014-09-15 NOTE — Progress Notes (Signed)
She presents today complaining primarily of her left foot swelling she denies a gout attack. They said it hurts right in here as she points to the dorsal lateral aspect of the left foot. She also has pain along the medial aspect and lateral aspect of the head of the first metatarsophalangeal joint left. Moderate edema is noted around this area she states.  Objective: Vital signs stable she is alert and oriented 3 all surgical sites and on the heel very well this is also according to radiographs. I see no signs of infection ROM radiographs. She has edema to the left lower extremity greater than the right tenderness on palpation to the first intermetatarsal space of the left foot.  Assessment: Capsulitis metatarsalgia left.  Plan: Injected the area today with eczema as a local aesthetic and will follow up with her in 1 month. She will continue non-steroidal anti-inflammatory's.

## 2014-09-27 ENCOUNTER — Other Ambulatory Visit: Payer: Self-pay

## 2014-09-27 DIAGNOSIS — Z1231 Encounter for screening mammogram for malignant neoplasm of breast: Secondary | ICD-10-CM

## 2014-09-30 ENCOUNTER — Ambulatory Visit: Payer: Medicare Other | Admitting: Podiatry

## 2014-10-06 ENCOUNTER — Ambulatory Visit
Admission: RE | Admit: 2014-10-06 | Discharge: 2014-10-06 | Disposition: A | Discharge status: Home / Self Care | Payer: Medicare Other | Source: Ambulatory Visit

## 2014-10-06 DIAGNOSIS — Z1231 Encounter for screening mammogram for malignant neoplasm of breast: Secondary | ICD-10-CM

## 2014-10-07 ENCOUNTER — Other Ambulatory Visit: Payer: Self-pay | Admitting: Surgery

## 2014-10-12 ENCOUNTER — Encounter: Payer: Self-pay | Admitting: Podiatry

## 2014-10-12 ENCOUNTER — Ambulatory Visit (INDEPENDENT_AMBULATORY_CARE_PROVIDER_SITE_OTHER): Payer: Medicare Other | Admitting: Podiatry

## 2014-10-12 VITALS — BP 141/84 | HR 103 | Resp 16

## 2014-10-12 DIAGNOSIS — M779 Enthesopathy, unspecified: Secondary | ICD-10-CM | POA: Diagnosis not present

## 2014-10-12 NOTE — Progress Notes (Signed)
She presents today with chief complaint of pain to the dorsal aspect of her left foot. She states that it feels a lot better than it did previously. She also goes on to say that she is going to have a LAP-BAND surgery performed in February. She also relates swelling to her left foot.  Objective: Vital signs are stable she is alert and oriented 3. Pulses are strongly palpable bilateral. Mild edema about the left foot overlying the Lisfranc's joints. She has pain in these areas particularly on final plane range of motion previous radiographs do demonstrate osteoarthritic changes of the midfoot.  Assessment: Diabetes mellitus with diabetic peripheral neuropathy. Arthropathy Lisfranc's joint left with capsulitis.  Plan: Injected subcutaneously today dexamethasone and local anesthesia Lisfranc's joint follow-up with me in 6 weeks.

## 2014-11-10 ENCOUNTER — Encounter: Payer: Self-pay | Admitting: Dietician

## 2014-11-10 ENCOUNTER — Encounter: Payer: Medicare Other | Attending: Surgery | Admitting: Dietician

## 2014-11-10 VITALS — Ht 66.0 in | Wt 249.9 lb

## 2014-11-10 DIAGNOSIS — Z6841 Body Mass Index (BMI) 40.0 and over, adult: Secondary | ICD-10-CM | POA: Insufficient documentation

## 2014-11-10 DIAGNOSIS — Z713 Dietary counseling and surveillance: Secondary | ICD-10-CM | POA: Insufficient documentation

## 2014-11-10 DIAGNOSIS — IMO0001 Reserved for inherently not codable concepts without codable children: Secondary | ICD-10-CM

## 2014-11-10 NOTE — Patient Instructions (Signed)

## 2014-11-10 NOTE — Progress Notes (Signed)
  Pre-Op Assessment Visit:  Pre-Operative LAGB Surgery  Medical Nutrition Therapy:  Appt start time: 0855  End time:  0935.  Patient was seen on 11/10/2014 for Pre-Operative Nutrition Assessment. Assessment and letter of approval faxed to [facility] B[facility] Surgery Program coordinator on 11/10/2014.   Preferred Learning Style:   No preference indicated   Learning Readiness:   Ready  Handouts given during visit include:  Pre-Op Goals Bariatric Surgery Protein Shakes   During the appointment today the following Pre-Op Goals were reviewed with the patient: Maintain or lose weight as instructed by your surgeon Make healthy food choices Begin to limit portion sizes Limited concentrated sugars and fried foods Keep fat/sugar in the single digits per serving on   food labels Practice CHEWING your food  (aim for 30 chews per bite or until applesauce consistency) Practice not drinking 15 minutes before, during, and 30 minutes after each meal/snack Avoid all carbonated beverages  Avoid/limit caffeinated beverages  Avoid all sugar-sweetened beverages Consume 3 meals per day; eat every 3-5 hours Make a list of non-food related activities Aim for 64-100 ounces of FLUID daily  Aim for at least 60-80 grams of PROTEIN daily Look for a liquid protein source that contain ?15 g protein and ?5 g carbohydrate  (ex: shakes, drinks, shots)  Patient-Centered Goals: Goals: be more active, get out and socialize more 8 level of confidence/10 level of importance   Demonstrated degree of understanding via:  Teach Back  Teaching Method Utilized:  Visual Auditory Hands on  Barriers to learning/adherence to lifestyle change: none  [facility] to [facility] the [facility] to enroll in Pre-Op and Post-Op Nutrition Education when surgery date is scheduled.

## 2014-11-11 ENCOUNTER — Other Ambulatory Visit: Payer: Self-pay

## 2014-11-11 MED ORDER — PREGABALIN 75 MG PO CAPS: 75.0000 mg | ORAL_CAPSULE | Freq: Two times a day (BID) | ORAL | Status: DC

## 2014-11-25 ENCOUNTER — Encounter: Payer: Self-pay | Admitting: Podiatry

## 2014-11-25 ENCOUNTER — Ambulatory Visit (INDEPENDENT_AMBULATORY_CARE_PROVIDER_SITE_OTHER): Payer: Medicare Other | Admitting: Podiatry

## 2014-11-25 DIAGNOSIS — B351 Tinea unguium: Secondary | ICD-10-CM | POA: Diagnosis not present

## 2014-11-25 DIAGNOSIS — M779 Enthesopathy, unspecified: Secondary | ICD-10-CM | POA: Diagnosis not present

## 2014-11-25 DIAGNOSIS — M79676 Pain in unspecified toe(s): Secondary | ICD-10-CM | POA: Diagnosis not present

## 2014-11-25 NOTE — Progress Notes (Signed)
She presents today for follow-up of pain to the left foot. She states that the majority of it is riding here she points to the first metatarsophalangeal joint left. She states the right foot is still mildly tender but is doing much better. States that her diabetes is under better control.  Objective: Vital signs are stable she is alert and oriented 3 pulses are palpable. She has pain on palpation and range of motion of the first metatarsophalangeal joint of the left foot.  Assessment: Diabetes mellitus with diabetic peripheral neuropathy as well as capsulitis first metatarsophalangeal joint left foot.  Plan: I injected the area today with asymmetric as a local aesthetic will follow up with her in 6 weeks.

## 2014-12-10 ENCOUNTER — Encounter: Payer: Medicare Other | Attending: Surgery | Admitting: Dietician

## 2014-12-10 ENCOUNTER — Encounter: Payer: Self-pay | Admitting: Dietician

## 2014-12-10 VITALS — Ht 66.0 in | Wt 251.7 lb

## 2014-12-10 DIAGNOSIS — Z713 Dietary counseling and surveillance: Secondary | ICD-10-CM | POA: Diagnosis not present

## 2014-12-10 DIAGNOSIS — Z6841 Body Mass Index (BMI) 40.0 and over, adult: Secondary | ICD-10-CM | POA: Diagnosis not present

## 2014-12-10 DIAGNOSIS — IMO0001 Reserved for inherently not codable concepts without codable children: Secondary | ICD-10-CM

## 2014-12-10 NOTE — Patient Instructions (Addendum)
-  Keep working on avoiding fried foods -Focus on lean proteins and non starchy vegetables -Have a protein food with each meal and snack  

## 2014-12-10 NOTE — Progress Notes (Signed)
Supervised Weight Loss:  Appt start time: 1030 end time:  1045.  SWL visit 1:  Primary concerns today: Nafisa returns for her 1st SWL visit in preparation for LAGB having gained about a pound and a half. She states she has been working on eating breakfast every day. Deziya reports her HgbA1c has improved from 11% to 7%. She is having trouble sleeping and eating due to nerve pain. Has been avoiding fried foods.    Plan: -Keep working on avoiding fried foods -Focus on lean proteins and non starchy vegetables -Have a protein food with each meal and snack  Weight: 251.7 lbs BMI: 40.7  Patient-Centered Goals: Goals: be more active, get out and socialize more 8 level of confidence/10 level of importance   MEDICATIONS: see list  Recent physical activity: cleaning house and [employer], inconsistent  Estimated energy needs: 1600-1800 calories  Progress Towards Goal(s):  In progress.   Nutritional Diagnosis:  [place]-3.3 Overweight/obesity related to past poor dietary habits and physical inactivity as evidenced by patient in SWL for pending LAGB surgery following dietary guidelines for continued weight loss.     Intervention:  Nutrition counseling provided.  Handouts given during visit include:  Pre op diet handout  Monitoring/Evaluation:  Dietary intake, exercise, and body weight in 4 week(s).

## 2015-01-05 ENCOUNTER — Encounter: Payer: Self-pay | Admitting: Dietician

## 2015-01-05 ENCOUNTER — Encounter: Payer: Medicare Other | Attending: Surgery | Admitting: Dietician

## 2015-01-05 VITALS — Ht 66.0 in | Wt 251.8 lb

## 2015-01-05 DIAGNOSIS — Z713 Dietary counseling and surveillance: Secondary | ICD-10-CM | POA: Diagnosis not present

## 2015-01-05 DIAGNOSIS — Z6841 Body Mass Index (BMI) 40.0 and over, adult: Secondary | ICD-10-CM | POA: Diagnosis not present

## 2015-01-05 DIAGNOSIS — IMO0001 Reserved for inherently not codable concepts without codable children: Secondary | ICD-10-CM

## 2015-01-05 NOTE — Progress Notes (Signed)
Supervised Weight Loss:  Appt start time: 930 end time:  945.  SWL visit 2:  Primary concerns today: Lacey Meyer returns for her 2nd SWL visit in preparation for LAGB having maintained her weight. Her brother died last week and Lacey Meyer has also been sick. She has not been able to keep food down (vomiting and diarrhea) so her focus has been hydration. She has had chicken noodle soup, water, coke, and ginger ale. She plans to resume goals when she is feeling better.     Plan: -Keep working on avoiding fried foods -Focus on lean proteins and non starchy vegetables -Have a protein food with each meal and snack  Weight: 251.8 lbs BMI: 40.7  Patient-Centered Goals: Goals: be more active, get out and socialize more 8 level of confidence/10 level of importance   MEDICATIONS: see list  Recent physical activity: cleaning house and [employer], inconsistent  Estimated energy needs: 1600-1800 calories  Progress Towards Goal(s):  In progress.   Nutritional Diagnosis:  Lacey Meyer-3.3 Overweight/obesity related to past poor dietary habits and physical inactivity as evidenced by patient in SWL for pending LAGB surgery following dietary guidelines for continued weight loss.     Intervention:  Nutrition counseling provided.  Monitoring/Evaluation:  Dietary intake, exercise, and body weight in 4 week(s).

## 2015-01-06 ENCOUNTER — Ambulatory Visit: Payer: Medicare Other | Admitting: Dietician

## 2015-01-13 ENCOUNTER — Encounter: Payer: Self-pay | Admitting: Podiatry

## 2015-01-13 ENCOUNTER — Ambulatory Visit (INDEPENDENT_AMBULATORY_CARE_PROVIDER_SITE_OTHER): Payer: Medicare Other | Admitting: Podiatry

## 2015-01-13 VITALS — BP 130/74 | HR 89 | Resp 16

## 2015-01-13 DIAGNOSIS — E1140 Type 2 diabetes mellitus with diabetic neuropathy, unspecified: Secondary | ICD-10-CM | POA: Diagnosis not present

## 2015-01-13 DIAGNOSIS — M779 Enthesopathy, unspecified: Secondary | ICD-10-CM | POA: Diagnosis not present

## 2015-01-13 DIAGNOSIS — Z794 Long term (current) use of insulin: Secondary | ICD-10-CM | POA: Diagnosis not present

## 2015-01-13 NOTE — Progress Notes (Signed)
She presents today for follow-up of her nerve neuropathy bilateral foot. She states that currently she is now going to a pain clinic and after extensive evaluation has determined that she doesn't suffer from diabetic peripheral neuropathy which is something that we have been treating her for for some time. At this point she is waiting for an implantable nerve stimulator to help lessen the symptoms of her neuropathy.  Objective: Vital signs shows alert and 3 pulses are palpable bilateral. She has loss is protective sensorium bilateral. Some tenderness beneath the hallux left.  Assessment: Diabetic with peripheral neuropathy.  Plan: Continue follow-up 3 months for nail debridement and diabetic follow-up.   Arbutus Pedodd Hyatt DPM

## 2015-01-28 ENCOUNTER — Encounter: Payer: Self-pay | Admitting: Dietician

## 2015-01-28 ENCOUNTER — Encounter: Payer: Medicare Other | Attending: Surgery | Admitting: Dietician

## 2015-01-28 VITALS — Ht 66.0 in | Wt 254.6 lb

## 2015-01-28 DIAGNOSIS — Z713 Dietary counseling and surveillance: Secondary | ICD-10-CM | POA: Insufficient documentation

## 2015-01-28 DIAGNOSIS — Z6841 Body Mass Index (BMI) 40.0 and over, adult: Secondary | ICD-10-CM | POA: Insufficient documentation

## 2015-01-28 DIAGNOSIS — IMO0001 Reserved for inherently not codable concepts without codable children: Secondary | ICD-10-CM

## 2015-01-28 NOTE — Progress Notes (Signed)
Supervised Weight Loss:  Appt start time: 920 end time:  935  SWL visit 3:  Primary concerns today: Lacey Meyer returns for her 3rd SWL visit in preparation for LAGB having gained a few pounds. She has had another death in the family and had had a lot of tempting food in her house. She has been feeling bloated. However, she has been trying to focus on baked foods and proteins. Has been drinking Premier protein shakes and likes them. Has neuropathy in her feet and makes it difficult to exercise. She reports that she checks her feet every day and does not walk barefoot. Lacey Meyer has been talking to friends that have had the surgery.   Plan: -Keep working on avoiding fried foods -Focus on lean proteins and non starchy vegetables -Have a protein food with each meal and snack  Weight: 254.6 lbs BMI: 41.2  Patient-Centered Goals: Goals: be more active, get out and socialize more 8 level of confidence/10 level of importance   MEDICATIONS: see list  Recent physical activity: cleaning house and [employer], inconsistent  Estimated energy needs: 1600-1800 calories  Progress Towards Goal(s):  In progress.   Nutritional Diagnosis:  [place]-3.3 Overweight/obesity related to past poor dietary habits and physical inactivity as evidenced by patient in SWL for pending LAGB surgery following dietary guidelines for continued weight loss.     Intervention:  Nutrition counseling provided.  Monitoring/Evaluation:  Dietary intake, exercise, and body weight in 4 week(s).

## 2015-01-28 NOTE — Patient Instructions (Signed)
-  Keep working on avoiding fried foods -Focus on lean proteins and non starchy vegetables -Have a protein food with each meal and snack

## 2015-02-24 ENCOUNTER — Encounter: Payer: Medicare Other | Attending: Surgery | Admitting: Dietician

## 2015-02-24 ENCOUNTER — Encounter: Payer: Self-pay | Admitting: Dietician

## 2015-02-24 VITALS — Ht 66.0 in | Wt 257.4 lb

## 2015-02-24 DIAGNOSIS — Z713 Dietary counseling and surveillance: Secondary | ICD-10-CM | POA: Diagnosis not present

## 2015-02-24 DIAGNOSIS — Z6841 Body Mass Index (BMI) 40.0 and over, adult: Secondary | ICD-10-CM | POA: Diagnosis not present

## 2015-02-24 DIAGNOSIS — IMO0001 Reserved for inherently not codable concepts without codable children: Secondary | ICD-10-CM

## 2015-02-24 NOTE — Patient Instructions (Addendum)
Plan: -Consider what foods you will be eating after surgery at the store and at meal times

## 2015-02-24 NOTE — Progress Notes (Signed)
Supervised Weight Loss:  Appt start time: 1105 end time:    SWL visit 4:  Primary concerns today: Lacey Meyer returns for her 4th SWL visit having gained a few pounds. She states her A1c has gone down to 6%. Thinking that she would prefer to have the sleeve rather than the LAGB. Still working on having baked meat and non starchy vegetables. She reports stress and frustration with the bariatric surgery process. As she is feeling very overwhelmed, we discussed the basic nutrition recommendations following surgery. We agreed that she will simply focus on what meals and snacks will be like following surgery.    Plan: -Consider what foods you will be eating after surgery at the store and at meal times  Weight: 257.4 lbs BMI: 41.6  Patient-Centered Goals: Goals: be more active, get out and socialize more 8 level of confidence/10 level of importance   MEDICATIONS: see list  Recent physical activity: cleaning house and [employer], inconsistent  Estimated energy needs: 1600-1800 calories  Progress Towards Goal(s):  In progress.   Nutritional Diagnosis:  [place]-3.3 Overweight/obesity related to past poor dietary habits and physical inactivity as evidenced by patient in SWL for pending LAGB surgery following dietary guidelines for continued weight loss.     Intervention:  Nutrition counseling provided.  Monitoring/Evaluation:  Dietary intake, exercise, and body weight in 4 week(s).

## 2015-03-28 ENCOUNTER — Encounter: Payer: Medicare Other | Attending: Surgery | Admitting: Skilled Nursing Facility1

## 2015-03-28 ENCOUNTER — Encounter: Payer: Self-pay | Admitting: Skilled Nursing Facility1

## 2015-03-28 DIAGNOSIS — E119 Type 2 diabetes mellitus without complications: Secondary | ICD-10-CM | POA: Insufficient documentation

## 2015-03-28 DIAGNOSIS — Z6841 Body Mass Index (BMI) 40.0 and over, adult: Secondary | ICD-10-CM | POA: Diagnosis not present

## 2015-03-28 DIAGNOSIS — Z713 Dietary counseling and surveillance: Secondary | ICD-10-CM | POA: Diagnosis not present

## 2015-03-28 NOTE — Progress Notes (Signed)
Supervised Weight Loss:  Appt start time: 1105 end time:    SWL visit 5:  Primary concerns today: Lacey Meyer returns for her 4th SWL visit having gained 2 pounds. Pt states for a while after her brothers passing she did not want to have the surgery but now after having grieved she is excited for the surgery again. Still working on having baked meat and non starchy vegetables.   Pt states she has found a protein shake. Pt states she has a lot of nephropathy pain daily. Pt states she would like to start using a nutribullet.   Plan: -Continue working on chewing -Continue working on not drinking water 15 minutes before a meal, during a meal, or 30 minutes after a meal -Work on limiting your carbohydrates throughout the day  Weight: 259 lbs BMI: 41.9  Patient-Centered Goals: Goals: be more active, get out and socialize more 8 level of confidence/10 level of importance   MEDICATIONS: see list  Recent physical activity: walking every morning for 30 minutes and sit ups at home  Estimated energy needs: 1600-1800 calories  Progress Towards Goal(s):  In progress.   Nutritional Diagnosis:  [name]-3.3 Overweight/obesity related to past poor dietary habits and physical inactivity as evidenced by patient in SWL for pending LAGB surgery following dietary guidelines for continued weight loss.     Intervention:  Nutrition counseling provided.  Monitoring/Evaluation:  Dietary intake, exercise, and body weight in 4 week(s).

## 2015-03-29 ENCOUNTER — Ambulatory Visit: Payer: Medicare Other | Admitting: Skilled Nursing Facility1

## 2015-04-21 ENCOUNTER — Ambulatory Visit: Payer: Medicare Other | Admitting: Podiatry

## 2015-04-28 ENCOUNTER — Encounter: Payer: Self-pay | Admitting: Podiatry

## 2015-04-28 ENCOUNTER — Ambulatory Visit (INDEPENDENT_AMBULATORY_CARE_PROVIDER_SITE_OTHER): Payer: Medicare Other | Admitting: Podiatry

## 2015-04-28 ENCOUNTER — Encounter: Payer: Self-pay | Admitting: Dietician

## 2015-04-28 ENCOUNTER — Encounter: Payer: Medicare Other | Attending: Surgery | Admitting: Dietician

## 2015-04-28 DIAGNOSIS — Z6841 Body Mass Index (BMI) 40.0 and over, adult: Secondary | ICD-10-CM | POA: Insufficient documentation

## 2015-04-28 DIAGNOSIS — Z713 Dietary counseling and surveillance: Secondary | ICD-10-CM | POA: Diagnosis not present

## 2015-04-28 DIAGNOSIS — Z794 Long term (current) use of insulin: Secondary | ICD-10-CM

## 2015-04-28 DIAGNOSIS — M79676 Pain in unspecified toe(s): Secondary | ICD-10-CM

## 2015-04-28 DIAGNOSIS — E1140 Type 2 diabetes mellitus with diabetic neuropathy, unspecified: Secondary | ICD-10-CM

## 2015-04-28 DIAGNOSIS — B351 Tinea unguium: Secondary | ICD-10-CM | POA: Diagnosis not present

## 2015-04-28 NOTE — Progress Notes (Signed)
She presents today to complaint of painful elongated toenails.  Objective: Toenails are thick yellow dystrophic onychomycotic and painful on palpation.  Assessment: Pain and limb secondary to onychomycosis.  Plan: Debridement of toenails 1 through 5 bilateral covered service secondary to pain.

## 2015-04-28 NOTE — Progress Notes (Signed)
Supervised Weight Loss:  Appt start time: 1145 end time:    SWL visit 6:  Primary concerns today: Rebeka returns for her 6th SWL visit having gained 3 pounds. She reports she has really been struggling with gout and feels like protein shakes make it worse. Has not been able to walk and feet have been very swollen. Just recently was able to afford her medication for gout. Has a lot of numbness in feet that is moving up legs. Feeling encouraged and excited for surgery. Mahima reports that she feels prepared from a nutrition standpoint.  Plan: -Continue working on chewing -Continue working on not drinking water 15 minutes before a meal, during a meal, or 30 minutes after a meal -Work on limiting your carbohydrates throughout the day  Weight: 263.9 lbs BMI: 42.7  Patient-Centered Goals: Goals: be more active, get out and socialize more 8 level of confidence/10 level of importance   MEDICATIONS: see list  Recent physical activity: walking every morning for 30 minutes and sit ups at home  Estimated energy needs: 1600-1800 calories  Progress Towards Goal(s):  In progress.   Nutritional Diagnosis:  [place]-3.3 Overweight/obesity related to past poor dietary habits and physical inactivity as evidenced by patient in SWL for pending LAGB surgery following dietary guidelines for continued weight loss.     Intervention:  Nutrition counseling provided.  Monitoring/Evaluation:  Dietary intake, exercise, and body weight in 4 week(s).

## 2015-07-29 ENCOUNTER — Ambulatory Visit (INDEPENDENT_AMBULATORY_CARE_PROVIDER_SITE_OTHER): Payer: Medicare Other

## 2015-07-29 ENCOUNTER — Ambulatory Visit (INDEPENDENT_AMBULATORY_CARE_PROVIDER_SITE_OTHER): Payer: Medicare Other | Admitting: Podiatry

## 2015-07-29 ENCOUNTER — Encounter: Payer: Self-pay | Admitting: Podiatry

## 2015-07-29 DIAGNOSIS — M79672 Pain in left foot: Secondary | ICD-10-CM | POA: Diagnosis not present

## 2015-07-29 DIAGNOSIS — Z794 Long term (current) use of insulin: Secondary | ICD-10-CM | POA: Diagnosis not present

## 2015-07-29 DIAGNOSIS — M79676 Pain in unspecified toe(s): Secondary | ICD-10-CM | POA: Diagnosis not present

## 2015-07-29 DIAGNOSIS — B351 Tinea unguium: Secondary | ICD-10-CM | POA: Diagnosis not present

## 2015-07-29 DIAGNOSIS — M21612 Bunion of left foot: Secondary | ICD-10-CM | POA: Diagnosis not present

## 2015-07-29 DIAGNOSIS — Z9889 Other specified postprocedural states: Secondary | ICD-10-CM

## 2015-07-29 DIAGNOSIS — E1140 Type 2 diabetes mellitus with diabetic neuropathy, unspecified: Secondary | ICD-10-CM | POA: Diagnosis not present

## 2015-07-29 NOTE — Progress Notes (Signed)
Patient ID: Lacey Meyer, female   DOB: 11/17/1960, 55 y.o.   MRN: [id number] Complaint:  Visit Type: Patient returns to my office for continued preventative foot care services. Complaint: Patient states" my nails have grown long and thick and become painful to walk and wear shoes" Patient has been diagnosed with DM with no foot complications. The patient presents for preventative foot care services. No changes to ROS.  She is also concerned about pain and swelling that is present left foot.  She says she had surgery by Dr. Al CorpusHyatt  And the left foot has not healed compared to her right foot.  Podiatric Exam: Vascular: dorsalis pedis and posterior tibial pulses are palpable bilateral. Capillary return is immediate. Temperature gradient is WNL. Skin turgor WNL  Sensorium: Diminished  Semmes Weinstein monofilament test. Normal tactile sensation bilaterally. Nail Exam: Pt has thick disfigured discolored nails with subungual debris noted bilateral entire nail hallux through fifth toenails Ulcer Exam: There is no evidence of ulcer or pre-ulcerative changes or infection. Orthopedic Exam: Muscle tone and strength are WNL. No limitations in general ROM. No crepitus or effusions noted. Foot type and digits show no abnormalities. Bony prominences are unremarkable. Pain and swelling left foot especially at the level 1st MPJ left foot. Skin: No Porokeratosis. No infection or ulcers  Diagnosis:  Onychomycosis, , Pain in right toe, pain in left toes,  Post- op swelling left foot.  Treatment & Plan Procedures and Treatment: Consent by patient was obtained for treatment procedures. The patient understood the discussion of treatment and procedures well. All questions were answered thoroughly reviewed. Debridement of mycotic and hypertrophic toenails, 1 through 5 bilateral and clearing of subungual debris. No ulceration, no infection noted. Xrays taken reveal healing at the surgical sites and the implants seem to be  intact. Told her to wear her anklet and return to cam walker to help decrease her left foot swelling. Return Visit-Office Procedure: Patient instructed to return to the office for a follow up visit 3 months for continued evaluation and treatment.     Helane GuntherGregory Semiyah Newgent DPM

## 2015-08-04 ENCOUNTER — Ambulatory Visit (INDEPENDENT_AMBULATORY_CARE_PROVIDER_SITE_OTHER): Payer: Medicare Other | Admitting: Podiatry

## 2015-08-04 ENCOUNTER — Encounter: Payer: Self-pay | Admitting: Podiatry

## 2015-08-04 VITALS — BP 156/82 | HR 94 | Resp 16

## 2015-08-04 DIAGNOSIS — Z794 Long term (current) use of insulin: Secondary | ICD-10-CM | POA: Diagnosis not present

## 2015-08-04 DIAGNOSIS — E1140 Type 2 diabetes mellitus with diabetic neuropathy, unspecified: Secondary | ICD-10-CM

## 2015-08-04 DIAGNOSIS — M779 Enthesopathy, unspecified: Secondary | ICD-10-CM

## 2015-08-04 DIAGNOSIS — S93601D Unspecified sprain of right foot, subsequent encounter: Secondary | ICD-10-CM

## 2015-08-04 NOTE — Progress Notes (Signed)
She presents today for follow-up of her neuropathy left foot. And swelling to the left foot. She states that she does also have it checked out because she hit her foot on the corner of her bed.  Objective: Vital signs are stable she is alert and oriented 3. Mild edema about the left foot but appears to be in good condition. Pulses are strong and palpable. She has good neurologic sensorium. She has great range of motion of the first metatarsophalangeal joint and radiographs taken last week do not demonstrate any type of osseus abnormalities to the area. Retention of internal fixation is intact but does not appear to be complicating.  Assessment: Well-healing surgical foot with mild edema postoperatively.  Plan: Follow up with us on an as-needed basis.

## 2015-08-11 ENCOUNTER — Ambulatory Visit: Payer: Medicare Other | Admitting: Podiatry

## 2015-08-26 ENCOUNTER — Other Ambulatory Visit (HOSPITAL_COMMUNITY): Payer: Self-pay | Admitting: Surgery

## 2015-09-05 ENCOUNTER — Ambulatory Visit (HOSPITAL_COMMUNITY)
Admission: RE | Admit: 2015-09-05 | Discharge: 2015-09-05 | Disposition: A | Discharge status: Home / Self Care | Payer: Medicare Other | Source: Ambulatory Visit | Attending: Surgery | Admitting: Surgery

## 2015-09-05 ENCOUNTER — Ambulatory Visit (HOSPITAL_COMMUNITY): Payer: Medicare Other

## 2015-09-20 ENCOUNTER — Ambulatory Visit (INDEPENDENT_AMBULATORY_CARE_PROVIDER_SITE_OTHER): Payer: Medicare Other | Admitting: Licensed Clinical Social Worker

## 2015-09-20 DIAGNOSIS — F4322 Adjustment disorder with anxiety: Secondary | ICD-10-CM | POA: Diagnosis not present

## 2015-09-21 ENCOUNTER — Other Ambulatory Visit: Payer: Self-pay | Admitting: Internal Medicine

## 2015-09-21 DIAGNOSIS — Z1231 Encounter for screening mammogram for malignant neoplasm of breast: Secondary | ICD-10-CM

## 2015-09-26 ENCOUNTER — Ambulatory Visit (HOSPITAL_COMMUNITY): Payer: Medicare Other

## 2015-10-03 ENCOUNTER — Other Ambulatory Visit: Payer: Self-pay

## 2015-10-03 ENCOUNTER — Ambulatory Visit (HOSPITAL_COMMUNITY)
Admission: RE | Admit: 2015-10-03 | Discharge: 2015-10-03 | Disposition: A | Discharge status: Home / Self Care | Payer: Medicare Other | Source: Ambulatory Visit | Attending: Surgery | Admitting: Surgery

## 2015-10-03 DIAGNOSIS — Z6841 Body Mass Index (BMI) 40.0 and over, adult: Secondary | ICD-10-CM | POA: Insufficient documentation

## 2015-10-03 DIAGNOSIS — Z01818 Encounter for other preprocedural examination: Secondary | ICD-10-CM | POA: Insufficient documentation

## 2015-10-03 DIAGNOSIS — K760 Fatty (change of) liver, not elsewhere classified: Secondary | ICD-10-CM | POA: Diagnosis not present

## 2015-10-04 ENCOUNTER — Ambulatory Visit (HOSPITAL_COMMUNITY): Payer: Medicare Other

## 2015-10-07 ENCOUNTER — Ambulatory Visit
Admission: RE | Admit: 2015-10-07 | Discharge: 2015-10-07 | Disposition: A | Discharge status: Home / Self Care | Payer: Medicare Other | Source: Ambulatory Visit | Attending: Internal Medicine | Admitting: Internal Medicine

## 2015-10-07 DIAGNOSIS — Z1231 Encounter for screening mammogram for malignant neoplasm of breast: Secondary | ICD-10-CM

## 2015-10-28 ENCOUNTER — Encounter: Payer: Self-pay | Admitting: Podiatry

## 2015-10-28 ENCOUNTER — Ambulatory Visit (INDEPENDENT_AMBULATORY_CARE_PROVIDER_SITE_OTHER): Payer: Medicare Other | Admitting: Podiatry

## 2015-10-28 DIAGNOSIS — B351 Tinea unguium: Secondary | ICD-10-CM

## 2015-10-28 DIAGNOSIS — E1140 Type 2 diabetes mellitus with diabetic neuropathy, unspecified: Secondary | ICD-10-CM | POA: Diagnosis not present

## 2015-10-28 DIAGNOSIS — M79676 Pain in unspecified toe(s): Secondary | ICD-10-CM

## 2015-10-28 NOTE — Progress Notes (Signed)
Patient ID: Lacey Meyer, female   DOB: 01/11/1961, 55 y.o.   MRN: [id number] Complaint:  Visit Type: Patient returns to my office for continued preventative foot care services. Complaint: Patient states" my nails have grown long and thick and become painful to walk and wear shoes" Patient has been diagnosed with DM with no foot complications. The patient presents for preventative foot care services. No changes to ROS.  She is also concerned about pain and swelling that is present left foot.  She says she had surgery by Dr. Al CorpusHyatt  And the left foot has not healed compared to her right foot.  Podiatric Exam: Vascular: dorsalis pedis and posterior tibial pulses are palpable bilateral. Capillary return is immediate. Temperature gradient is WNL. Skin turgor WNL  Sensorium: Diminished  Semmes Weinstein monofilament test. Normal tactile sensation bilaterally. Nail Exam: Pt has thick disfigured discolored nails with subungual debris noted bilateral entire nail hallux through fifth toenails Ulcer Exam: There is no evidence of ulcer or pre-ulcerative changes or infection. Orthopedic Exam: Muscle tone and strength are WNL. No limitations in general ROM. No crepitus or effusions noted. Foot type and digits show no abnormalities. Bony prominences are unremarkable. Pain and swelling left foot especially at the level 1st MPJ left foot. Skin: No Porokeratosis. No infection or ulcers  Diagnosis:  Onychomycosis, , Pain in right toe, pain in left toes,  Post- op swelling left foot.  Treatment & Plan Procedures and Treatment: Consent by patient was obtained for treatment procedures. The patient understood the discussion of treatment and procedures well. All questions were answered thoroughly reviewed. Debridement of mycotic and hypertrophic toenails, 1 through 5 bilateral and clearing of subungual debris. No ulceration, no infection noted. Xrays taken reveal healing at the surgical sites and the implants seem to be  intact. Told her to wear her anklet and return to cam walker to help decrease her left foot swelling. Return Visit-Office Procedure: Patient instructed to return to the office for a follow up visit 3 months for continued evaluation and treatment.     Helane GuntherGregory Juaquina Machnik DPM

## 2015-11-28 ENCOUNTER — Ambulatory Visit: Payer: Self-pay

## 2015-12-05 ENCOUNTER — Other Ambulatory Visit: Payer: Self-pay | Admitting: Internal Medicine

## 2015-12-05 DIAGNOSIS — E2839 Other primary ovarian failure: Secondary | ICD-10-CM

## 2015-12-07 NOTE — Progress Notes (Signed)
Scheduling pre op--please place SURGICAL ORDERS IN EPIC  thanks 

## 2015-12-12 ENCOUNTER — Encounter: Payer: Self-pay | Admitting: Dietician

## 2015-12-12 ENCOUNTER — Encounter: Payer: Medicare Other | Attending: Surgery | Admitting: Dietician

## 2015-12-12 DIAGNOSIS — E119 Type 2 diabetes mellitus without complications: Secondary | ICD-10-CM | POA: Diagnosis not present

## 2015-12-12 DIAGNOSIS — Z713 Dietary counseling and surveillance: Secondary | ICD-10-CM | POA: Diagnosis not present

## 2015-12-12 DIAGNOSIS — IMO0001 Reserved for inherently not codable concepts without codable children: Secondary | ICD-10-CM

## 2015-12-12 NOTE — Progress Notes (Signed)
  Pre-Operative Nutrition Class:  Appt start time: 830   End time:  930.  Patient was seen on 12/12/2015 for Pre-Operative Bariatric Surgery Education at [facility].   Surgery date: 01/03/2016 Surgery type: Sleeve gastrectomy Start weight at [facility]: 250 lbs on 11/10/2014 Weight today: 268 lbs  TANITA  BODY COMP RESULTS  12/12/15   BMI (kg/m^2) 43.3   Fat Mass (lbs) 136.4   Fat Free Mass (lbs) 131.6   Total Body Water (lbs) 96.6   Samples given per MNT protocol. Patient educated on appropriate usage: Bariatric Advantage Multivitamin (mixed fruit - qty 1) Lot #: [id number] Exp: 09/2016  Bariatric Advantage Calcium Citrate chew (strawberry - qty 1) Lot #: [id number] Exp: 07/2016  Premier protein shake (strawberry - qty 1) Lot #: [id number] Exp: 11/2016  Renee Pain Protein Powder (chocolate splendor - qty 1) Lot #: [id number] Exp: 03/2017  The following the learning objectives were met by the patient during this course:  Identify Pre-Op Dietary Goals and will begin 2 weeks pre-operatively  Identify appropriate sources of fluids and proteins   State protein recommendations and appropriate sources pre and post-operatively  Identify Post-Operative Dietary Goals and will follow for 2 weeks post-operatively  Identify appropriate multivitamin and calcium sources  Describe the need for physical activity post-operatively and will follow MD recommendations  State when to call healthcare provider regarding medication questions or post-operative complications  Handouts given during class include:  Pre-Op Bariatric Surgery Diet Handout  Protein Shake Handout  Post-Op Bariatric Surgery Nutrition Handout  BELT Program Information Flyer  Support Group Information Flyer  [facility] Bariatric Supplements Price List  Follow-Up Plan: Patient will follow-up at [facility] 2 weeks post operatively for diet advancement per MD.

## 2015-12-14 ENCOUNTER — Other Ambulatory Visit: Payer: Self-pay | Admitting: Surgery

## 2015-12-15 ENCOUNTER — Ambulatory Visit
Admission: RE | Admit: 2015-12-15 | Discharge: 2015-12-15 | Disposition: A | Discharge status: Home / Self Care | Payer: Medicare Other | Source: Ambulatory Visit | Attending: Internal Medicine | Admitting: Internal Medicine

## 2015-12-15 DIAGNOSIS — E2839 Other primary ovarian failure: Secondary | ICD-10-CM

## 2015-12-22 ENCOUNTER — Other Ambulatory Visit: Payer: Self-pay | Admitting: Surgery

## 2015-12-29 ENCOUNTER — Encounter (HOSPITAL_COMMUNITY)
Admission: RE | Admit: 2015-12-29 | Discharge: 2015-12-29 | Disposition: A | Discharge status: Home / Self Care | Payer: Medicare Other | Source: Ambulatory Visit | Attending: Surgery | Admitting: Surgery

## 2015-12-29 ENCOUNTER — Encounter (HOSPITAL_COMMUNITY): Payer: Self-pay

## 2015-12-29 DIAGNOSIS — Z6841 Body Mass Index (BMI) 40.0 and over, adult: Secondary | ICD-10-CM | POA: Diagnosis not present

## 2015-12-29 DIAGNOSIS — Z01812 Encounter for preprocedural laboratory examination: Secondary | ICD-10-CM | POA: Diagnosis not present

## 2015-12-29 HISTORY — DX: Personal history of other medical treatment: Z92.89

## 2015-12-29 HISTORY — DX: Unspecified osteoarthritis, unspecified site: M19.90

## 2015-12-29 HISTORY — DX: Gastro-esophageal reflux disease without esophagitis: K21.9

## 2015-12-29 HISTORY — DX: Type 2 diabetes mellitus without complications: E11.9

## 2015-12-29 LAB — CBC WITH DIFFERENTIAL/PLATELET
Basophils Absolute: 0 10*3/uL (ref 0.0–0.1)
Basophils Relative: 1 %
Eosinophils Absolute: 0.3 10*3/uL (ref 0.0–0.7)
Eosinophils Relative: 3 %
HCT: 38.5 % (ref 36.0–46.0)
HEMOGLOBIN: 13.2 g/dL (ref 12.0–15.0)
LYMPHS ABS: 3.1 10*3/uL (ref 0.7–4.0)
Lymphocytes Relative: 36 %
MCH: 28.7 pg (ref 26.0–34.0)
MCHC: 34.3 g/dL (ref 30.0–36.0)
MCV: 83.7 fL (ref 78.0–100.0)
MONOS PCT: 8 %
Monocytes Absolute: 0.7 10*3/uL (ref 0.1–1.0)
NEUTROS ABS: 4.5 10*3/uL (ref 1.7–7.7)
NEUTROS PCT: 52 %
Platelets: 212 10*3/uL (ref 150–400)
RBC: 4.6 MIL/uL (ref 3.87–5.11)
RDW: 15 % (ref 11.5–15.5)
WBC: 8.6 10*3/uL (ref 4.0–10.5)

## 2015-12-29 LAB — COMPREHENSIVE METABOLIC PANEL
ALK PHOS: 65 U/L (ref 38–126)
ALT: 29 U/L (ref 14–54)
ANION GAP: 10 (ref 5–15)
AST: 44 U/L — ABNORMAL HIGH (ref 15–41)
Albumin: 4.2 g/dL (ref 3.5–5.0)
BILIRUBIN TOTAL: 0.6 mg/dL (ref 0.3–1.2)
BUN: 23 mg/dL — ABNORMAL HIGH (ref 6–20)
CALCIUM: 9.4 mg/dL (ref 8.9–10.3)
CO2: 22 mmol/L (ref 22–32)
Chloride: 103 mmol/L (ref 101–111)
Creatinine, Ser: 0.9 mg/dL (ref 0.44–1.00)
GFR calc non Af Amer: >60 mL/min (ref >60)
GLUCOSE: 247 mg/dL — AB (ref 65–99)
Potassium: 4.1 mmol/L (ref 3.5–5.1)
Sodium: 135 mmol/L (ref 135–145)
TOTAL PROTEIN: 8.2 g/dL — AB (ref 6.5–8.1)

## 2015-12-29 LAB — GLUCOSE, CAPILLARY: Glucose-Capillary: 259 mg/dL — ABNORMAL HIGH (ref 65–99)

## 2015-12-29 MED FILL — oxyCODONE HCL 5 MG/5ML SOLN: 5 | 3 days supply | Qty: 200 | Fill #0

## 2015-12-29 NOTE — Progress Notes (Signed)
CMP results in epic per PAT visit 12/29/2015 sent to Dr Ezzard StandingNewman

## 2015-12-29 NOTE — Patient Instructions (Addendum)
[facility]  12/29/2015   Your procedure is scheduled on: Tuesday January 03, 2016  Report to [facility] Main  Entrance take [place]  elevators to 3rd floor to  Short Stay Center at 6:45 AM.  Call this number if you have problems the morning of surgery [phone]   Remember: ONLY 1 PERSON MAY GO WITH YOU TO SHORT STAY TO GET  READY MORNING OF YOUR SURGERY.  Do not eat food or drink liquids :After Midnight.     Take these medicines the morning of surgery with A SIP OF WATER: Amlodipine; Allopurinol; Lyrica; Oxycodone-Acetaminophen if needed; Pantoprazole   DO NOT TAKE ANY DIABETIC MEDICATIONS DAY OF YOUR SURGERY  How to Manage Your Diabetes Before and After Surgery  Why is it important to control my blood sugar before and after surgery? . Improving blood sugar levels before and after surgery helps healing and can limit problems. . A way of improving blood sugar control is eating a healthy diet by: o  Eating less sugar and carbohydrates o  Increasing activity/exercise o  Talking with your doctor about reaching your blood sugar goals . High blood sugars (greater than 180 mg/dL) can raise your risk of infections and slow your recovery, so you will need to focus on controlling your diabetes during the weeks before surgery. . Make sure that the doctor who takes care of your diabetes knows about your planned surgery including the date and location.  How do I manage my blood sugar before surgery? . Check your blood sugar at least 4 times a day, starting 2 days before surgery, to make sure that the level is not too high or low. o Check your blood sugar the morning of your surgery when you wake up and every 2 hours until you get to the Short Stay unit. . If your blood sugar is less than 70 mg/dL, you will need to treat for low blood sugar: o Do not take insulin. o Treat a low blood sugar (less than 70 mg/dL) with  cup of clear juice (cranberry or apple), 4  glucose tablets, OR glucose gel. o Recheck blood sugar in 15 minutes after treatment (to make sure it is greater than 70 mg/dL). If your blood sugar is not greater than 70 mg/dL on recheck, call [phone] for further instructions. . Report your blood sugar to the short stay nurse when you get to Short Stay.  . If you are admitted to the hospital after surgery: o Your blood sugar will be checked by the staff and you will probably be given insulin after surgery (instead of oral diabetes medicines) to make sure you have good blood sugar levels. o The goal for blood sugar control after surgery is 80-180 mg/dL.   WHAT DO I DO ABOUT MY DIABETES MEDICATION?  Marland Kitchen Do not take oral diabetes medicines (pills) the morning of surgery.   . The day of surgery, do not take other diabetes injectables, including Byetta (exenatide), Bydureon (exenatide ER), Victoza (liraglutide), or Trulicity (dulaglutide).    Patient Signature:  Date:   Nurse Signature:  Date:   Reviewed and Endorsed by [facility] Patient Education Committee, August 2015                               You may not have any metal on your body including hair pins  and              piercings  Do not wear jewelry, make-up, lotions, powders or perfumes, deodorant             Do not wear nail polish.  Do not shave  48 hours prior to surgery.                Do not bring valuables to the hospital. [facility] IS NOT             RESPONSIBLE   FOR VALUABLES.  Contacts, dentures or bridgework may not be worn into surgery.  Leave suitcase in the car. After surgery it may be brought to your room.    Special Instructions: FOLLOW SURGEON'S INSTRUCTION IN REGARDS TO BOWEL PREPARATION PRIOR TO SURGICAL PROCEDURE DATE               Please read over the following fact sheets you were given:INCENTIVE SPIROMETER  _____________________________________________________________________             [facility] - Preparing for Surgery Before surgery,  you can play an important role.  Because skin is not sterile, your skin needs to be as free of germs as possible.  You can reduce the number of germs on your skin by washing with CHG (chlorahexidine gluconate) soap before surgery.  CHG is an antiseptic cleaner which kills germs and bonds with the skin to continue killing germs even after washing. Please DO NOT use if you have an allergy to CHG or antibacterial soaps.  If your skin becomes reddened/irritated stop using the CHG and inform your nurse when you arrive at Short Stay. Do not shave (including legs and underarms) for at least 48 hours prior to the first CHG shower.  You may shave your face/neck. Please follow these instructions carefully:  1.  Shower with CHG Soap the night before surgery and the  morning of Surgery.  2.  If you choose to wash your hair, wash your hair first as usual with your  normal  shampoo.  3.  After you shampoo, rinse your hair and body thoroughly to remove the  shampoo.                           4.  Use CHG as you would any other liquid soap.  You can apply chg directly  to the skin and wash                       Gently with a scrungie or clean washcloth.  5.  Apply the CHG Soap to your body ONLY FROM THE NECK DOWN.   Do not use on face/ open                           Wound or open sores. Avoid contact with eyes, ears mouth and genitals (private parts).                       Wash face,  Genitals (private parts) with your normal soap.             6.  Wash thoroughly, paying special attention to the area where your surgery  will be performed.  7.  Thoroughly rinse your body with warm water from the neck down.  8.  DO NOT shower/wash with your normal soap after using and  rinsing off  the CHG Soap.                9.  Pat yourself dry with a clean towel.            10.  Wear clean pajamas.            11.  Place clean sheets on your bed the night of your first shower and do not  sleep with pets. Day of Surgery : Do not  apply any lotions/deodorants the morning of surgery.  Please wear clean clothes to the hospital/surgery center.  FAILURE TO FOLLOW THESE INSTRUCTIONS MAY RESULT IN THE CANCELLATION OF YOUR SURGERY PATIENT SIGNATURE_________________________________  NURSE SIGNATURE__________________________________  ________________________________________________________________________   Rogelia Mire  An incentive spirometer is a tool that can help keep your lungs clear and active. This tool measures how well you are filling your lungs with each breath. Taking long deep breaths may help reverse or decrease the chance of developing breathing (pulmonary) problems (especially infection) following:  A long period of time when you are unable to move or be active. BEFORE THE PROCEDURE   If the spirometer includes an indicator to show your best effort, your nurse or respiratory therapist will set it to a desired goal.  If possible, sit up straight or lean slightly forward. Try not to slouch.  Hold the incentive spirometer in an upright position. INSTRUCTIONS FOR USE  1. Sit on the edge of your bed if possible, or sit up as far as you can in bed or on a chair. 2. Hold the incentive spirometer in an upright position. 3. Breathe out normally. 4. Place the mouthpiece in your mouth and seal your lips tightly around it. 5. Breathe in slowly and as deeply as possible, raising the piston or the ball toward the top of the column. 6. Hold your breath for 3-5 seconds or for as long as possible. Allow the piston or ball to fall to the bottom of the column. 7. Remove the mouthpiece from your mouth and breathe out normally. 8. Rest for a few seconds and repeat Steps 1 through 7 at least 10 times every 1-2 hours when you are awake. Take your time and take a few normal breaths between deep breaths. 9. The spirometer may include an indicator to show your best effort. Use the indicator as a goal to work toward during  each repetition. 10. After each set of 10 deep breaths, practice coughing to be sure your lungs are clear. If you have an incision (the cut made at the time of surgery), support your incision when coughing by placing a pillow or rolled up towels firmly against it. Once you are able to get out of bed, walk around indoors and cough well. You may stop using the incentive spirometer when instructed by your caregiver.  RISKS AND COMPLICATIONS  Take your time so you do not get dizzy or light-headed.  If you are in pain, you may need to take or ask for pain medication before doing incentive spirometry. It is harder to take a deep breath if you are having pain. AFTER USE  Rest and breathe slowly and easily.  It can be helpful to keep track of a log of your progress. Your caregiver can provide you with a simple table to help with this. If you are using the spirometer at home, follow these instructions: SEEK MEDICAL CARE IF:   You are having difficultly using the spirometer.  You have trouble using  the spirometer as often as instructed.  Your pain medication is not giving enough relief while using the spirometer.  You develop fever of 100.5 F (38.1 C) or higher. SEEK IMMEDIATE MEDICAL CARE IF:   You cough up bloody sputum that had not been present before.  You develop fever of 102 F (38.9 C) or greater.  You develop worsening pain at or near the incision site. MAKE SURE YOU:   Understand these instructions.  Will watch your condition.  Will get help right away if you are not doing well or get worse. Document Released: 07/09/2006 Document Revised: 05/21/2011 Document Reviewed: 09/09/2006 [facility] Patient Information 2014 [place], [place].   ________________________________________________________________________

## 2015-12-29 NOTE — Progress Notes (Signed)
Pt is aware to take metoprolol morning of surgery

## 2015-12-30 LAB — HEMOGLOBIN A1C
Hgb A1c MFr Bld: 10 % — ABNORMAL HIGH (ref 4.8–5.6)
MEAN PLASMA GLUCOSE: 240 mg/dL

## 2016-01-02 NOTE — H&P (Signed)
Lacey Meyer. Simic  Location: [facility] Patient #: [id number] DOB: [birth year] Single / Language: [place] / Race: Black or African American Female   History of Present Illness   The patient is a 55 year old female who presents for a bariatric surgery evaluation.   Her PCP is Dr. Cecil Cranker.  She is accompanied by Lacey Meyer - her significant other.   She is ready for surgery. I think that she has a good handle on the operation, the potential complications, and the post operative course.  She is seeing a chiropractor for her feet. She has some drawings on the feet - she said that the numbness is better.  She has started the [employer]. She is on for surgery on 10/24.  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  I saw her in July 2016. It looks like she went through the 6 months of supervised weight loss - but then things came to a stop in February. She said that she called the office several times - but I am not sure that she spoke to any one. She has gained about 6 pounds since I saw her last year. She's also become partly derailed because she had a brother who died of stage IV cancer several months ago. Then last week she had a 88 year old nephew murdered, probably as part of a gang. It seems like time to reset part of her evaluation. She is scheduled for a psych evaluation on Monday, July 10. She is scheduled for x-rays July 24. We will reschedule her for nutrition since she switching to a sleeve. When this information is complete, we can and to schedule the surgery. Her insurance is unchanged from last year.  History of weight problems: She is interested in weight loss surgery/lap band surgery. Her inital BMI is 41.47, her weight is 258. She went to an information session that  she thinks I gave. She has tried multiple diets including Weight Watchers, Atkins, a salad diet, no meat diet. She has tried walking. She took some pills, Lipozene, which did not help with weight loss.   She was originally interested in the lap band, but she has switched to the sleeve, which I think is good. She has a friend, Drue Stager, who had a sleeve and has done very well. Per the 1991 NIH Consensus Statement, the patient is a candidate for bariatric surgery. The patient attended our initial information session and reviewed the types of bariatric surgery.  The patient is interested in the sleeve gastrectomy. I discussed with the patient the indications and risks of bariatric surgery. The potential risks of surgery include, but are not limited to, bleeding, infection, leak from the bowel, DVT and PE, open surgery, long term nutrition consequences, and death. The patient understands the importance of compliance and long term follow-up with our group after surgery.  Past Medical History: 1. HTN x 2009 2. She tested negative for sleep apnea in 2013. 3. Musculoskeletal - arthritis She has had both feet operated on - left 03/26/2014 and right 07/17/2013.  Sees Dr. Al Corpus 4. Diabetes since 2015. On Trilicity and Mefformin Her HgbA1C - 8.1 - 10/21/2014 5. Legally blind in both eyes, right worse than left - she thinks that it is part of macular degeneration Sees Dr. Luciana Axe, though what he has done recenly has  made things worse 6. 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 7. Gout x 6 years. Last attack in 2012 8. 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 Has 3 children: Son 3, daughter 16, and son 60. She is on chronic disability   Other Problems Kandis Cocking, MD; 12/22/2015 4:38 PM) Anxiety Disorder Arthritis Diabetes Mellitus High blood  pressure Hypercholesterolemia Other disease, cancer, significant illness  Past Surgical History Kandis Cocking, MD; 12/22/2015 4:38 PM) Cataract Surgery Right. Foot Surgery Bilateral. Oral Surgery  Allergies Fay Records, CMA; 12/22/2015 3:24 PM) Ampicillin *PENICILLINS* Doxycycline *DERMATOLOGICALS*  Medication History Fay Records, CMA; 12/22/2015 3:24 PM) ALPRAZolam (0.25MG  Tablet, Oral) Active. Simvastatin (20MG  Tablet, Oral) Active. Pantoprazole Sodium (40MG  Tablet DR, Oral) Active. Metoprolol Succinate ER (25MG  Tablet ER 24HR, Oral) Active. MetFORMIN HCl (500MG  Tablet, Oral) Active. Glimepiride (2MG  Tablet, Oral) Active. Furosemide (40MG  Tablet, Oral) Active. Benazepril HCl (40MG  Tablet, Oral) Active. Allopurinol (300MG  Tablet, Oral) Active. Lyrica (150MG  Capsule, Oral) Active. Zolpidem Tartrate (10MG  Tablet, Oral) Active. AmLODIPine Besylate (10MG  Tablet, Oral) Active. Colchicine (0.6MG  Tablet, Oral) Active. Medications Reconciled  Vitals Fay Records CMA; 12/22/2015 3:24 PM) 12/22/2015 3:24 PM Weight: 263.4 lb Height: 66in Body Surface Area: 2.25 m Body Mass Index: 42.51 kg/m  Temp.: 98.11F(Temporal)  Pulse: 105 (Regular)  BP: 138/86 (Sitting, Left Arm, Standard)   Physical Exam  General: WN obese AA F alert. HEENT: Normal. Pupils equal. Decreased vision in both eyes.  Neck: Supple. No mass. No thyroid mass.  Lymph Nodes: No supraclavicular or cervical nodes.  Lungs: Clear to auscultation and symmetric breath sounds. Heart: RRR. No murmur or rub.  Abdomen: Soft. No mass. No tenderness. No hernia. Normal bowel sounds.  Lower midline incision. She is more apple that pear. She has a pannus that is more than expected for her weight.  Extremities: Good strength and ROM in upper and lower extremities.  Neurologic: Grossly intact to motor and sensory function. Psychiatric: Has normal mood and affect. Behavior  is normal.    Assessment & Plan  1.  MORBID OBESITY WITH BMI OF 40.0-44.9, ADULT (E66.01)  Plan:  1) She is on for sleeve gastrectomy for 01/03/2016. She has completed the process.  2. HTN x 2009 3. Musculoskeletal - arthritis  She has had both feet operated on - left 03/26/2014 and right 07/17/2013.   Sees Dr. Al Corpus 4. Diabetes since 2015.  On Trilicity and Mefformin  Her HgbA1C - 8.1 - 10/21/2014 5. Legally blind in both eyes, right worse than left - she thinks that it is part of macular degeneration  Sees Dr. Luciana Axe, though what he has done recenly has made things worse 6. 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 7. Gout x 6 years.  Last attack in 2012 8. 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

## 2016-01-02 NOTE — Progress Notes (Signed)
Instructed Ms. Lacey Meyer to arrive to Short Stay at 0730. NPO after midnight. Patient verbalized understanding.

## 2016-01-03 ENCOUNTER — Encounter (HOSPITAL_COMMUNITY)
Admission: RE | Disposition: A | Discharge status: Home / Self Care | Payer: Self-pay | Source: Ambulatory Visit | Attending: Surgery

## 2016-01-03 ENCOUNTER — Inpatient Hospital Stay (HOSPITAL_COMMUNITY): Payer: Medicare Other | Admitting: Anesthesiology

## 2016-01-03 ENCOUNTER — Encounter (HOSPITAL_COMMUNITY): Payer: Self-pay | Admitting: *Deleted

## 2016-01-03 ENCOUNTER — Inpatient Hospital Stay (HOSPITAL_COMMUNITY)
Admission: RE | Admit: 2016-01-03 | Discharge: 2016-01-05 | DRG: 621 | Disposition: A | Discharge status: Home / Self Care | Payer: Medicare Other | Source: Ambulatory Visit | Attending: Surgery | Admitting: Surgery

## 2016-01-03 DIAGNOSIS — K760 Fatty (change of) liver, not elsewhere classified: Secondary | ICD-10-CM | POA: Diagnosis present

## 2016-01-03 DIAGNOSIS — H548 Legal blindness, as defined in USA: Secondary | ICD-10-CM | POA: Diagnosis present

## 2016-01-03 DIAGNOSIS — Z7984 Long term (current) use of oral hypoglycemic drugs: Secondary | ICD-10-CM

## 2016-01-03 DIAGNOSIS — M109 Gout, unspecified: Secondary | ICD-10-CM | POA: Diagnosis present

## 2016-01-03 DIAGNOSIS — E7800 Pure hypercholesterolemia, unspecified: Secondary | ICD-10-CM | POA: Diagnosis present

## 2016-01-03 DIAGNOSIS — E1140 Type 2 diabetes mellitus with diabetic neuropathy, unspecified: Secondary | ICD-10-CM | POA: Diagnosis present

## 2016-01-03 DIAGNOSIS — Z79899 Other long term (current) drug therapy: Secondary | ICD-10-CM

## 2016-01-03 DIAGNOSIS — I10 Essential (primary) hypertension: Secondary | ICD-10-CM | POA: Diagnosis present

## 2016-01-03 DIAGNOSIS — F419 Anxiety disorder, unspecified: Secondary | ICD-10-CM | POA: Diagnosis present

## 2016-01-03 DIAGNOSIS — Z6841 Body Mass Index (BMI) 40.0 and over, adult: Secondary | ICD-10-CM

## 2016-01-03 HISTORY — PX: LAPAROSCOPIC GASTRIC SLEEVE RESECTION: SHX5895

## 2016-01-03 LAB — GLUCOSE, CAPILLARY
GLUCOSE-CAPILLARY: 189 mg/dL — AB (ref 65–99)
GLUCOSE-CAPILLARY: 264 mg/dL — AB (ref 65–99)
GLUCOSE-CAPILLARY: 162 mg/dL — AB (ref 65–99)
Glucose-Capillary: 150 mg/dL — ABNORMAL HIGH (ref 65–99)
Glucose-Capillary: 175 mg/dL — ABNORMAL HIGH (ref 65–99)
Glucose-Capillary: 243 mg/dL — ABNORMAL HIGH (ref 65–99)

## 2016-01-03 LAB — HEMOGLOBIN AND HEMATOCRIT, BLOOD
HCT: 39.5 % (ref 36.0–46.0)
HEMOGLOBIN: 13 g/dL (ref 12.0–15.0)

## 2016-01-03 SURGERY — GASTRECTOMY, SLEEVE, LAPAROSCOPIC
Anesthesia: General | Site: Abdomen

## 2016-01-03 MED ORDER — 0.9 % SODIUM CHLORIDE (POUR BTL) OPTIME
TOPICAL | Status: DC | PRN
  Administered 2016-01-03: 1000 mL

## 2016-01-03 MED ORDER — ACETAMINOPHEN 160 MG/5ML PO SOLN: 325.0000 mg | ORAL | Status: DC | PRN

## 2016-01-03 MED ORDER — HEPARIN SODIUM (PORCINE) 5000 UNIT/ML IJ SOLN
5000.0000 [IU] | Freq: Three times a day (TID) | INTRAMUSCULAR | Status: DC
  Administered 2016-01-03 – 2016-01-05 (×5): 5000 [IU] via SUBCUTANEOUS
  Filled 2016-01-03 (×5): qty 1

## 2016-01-03 MED ORDER — SUGAMMADEX SODIUM 200 MG/2ML IV SOLN
INTRAVENOUS | Status: AC
  Filled 2016-01-03: qty 2

## 2016-01-03 MED ORDER — CEFOTETAN DISODIUM-DEXTROSE 2-2.08 GM-% IV SOLR
2.0000 g | INTRAVENOUS | Status: AC
  Administered 2016-01-03: 2 g via INTRAVENOUS

## 2016-01-03 MED ORDER — PROMETHAZINE HCL 25 MG/ML IJ SOLN: 6.2500 mg | INTRAMUSCULAR | Status: DC | PRN

## 2016-01-03 MED ORDER — ROCURONIUM BROMIDE 10 MG/ML (PF) SYRINGE
PREFILLED_SYRINGE | INTRAVENOUS | Status: DC | PRN
  Administered 2016-01-03: 10 mg via INTRAVENOUS
  Administered 2016-01-03: 50 mg via INTRAVENOUS
  Administered 2016-01-03: 10 mg via INTRAVENOUS

## 2016-01-03 MED ORDER — SUGAMMADEX SODIUM 200 MG/2ML IV SOLN
INTRAVENOUS | Status: DC | PRN
  Administered 2016-01-03: 250 mg via INTRAVENOUS

## 2016-01-03 MED ORDER — FENTANYL CITRATE (PF) 250 MCG/5ML IJ SOLN
INTRAMUSCULAR | Status: AC
  Filled 2016-01-03: qty 5

## 2016-01-03 MED ORDER — PREMIER PROTEIN SHAKE
2.0000 [oz_av] | ORAL | Status: DC
  Administered 2016-01-05 (×2): 2 [oz_av] via ORAL

## 2016-01-03 MED ORDER — INSULIN ASPART 100 UNIT/ML ~~LOC~~ SOLN
9.0000 [IU] | Freq: Once | SUBCUTANEOUS | Status: AC
  Administered 2016-01-03: 9 [IU] via SUBCUTANEOUS

## 2016-01-03 MED ORDER — MIDAZOLAM HCL 2 MG/2ML IJ SOLN
INTRAMUSCULAR | Status: AC
  Filled 2016-01-03: qty 2

## 2016-01-03 MED ORDER — LACTATED RINGERS IR SOLN
Status: DC | PRN
  Administered 2016-01-03: 1000 mL

## 2016-01-03 MED ORDER — INSULIN ASPART 100 UNIT/ML ~~LOC~~ SOLN
SUBCUTANEOUS | Status: AC
  Filled 2016-01-03: qty 1

## 2016-01-03 MED ORDER — LIDOCAINE 2% (20 MG/ML) 5 ML SYRINGE
INTRAMUSCULAR | Status: DC | PRN
  Administered 2016-01-03: 80 mg via INTRAVENOUS

## 2016-01-03 MED ORDER — POTASSIUM CHLORIDE IN NACL 20-0.45 MEQ/L-% IV SOLN
INTRAVENOUS | Status: DC
  Administered 2016-01-03 – 2016-01-04 (×3): via INTRAVENOUS
  Administered 2016-01-05: 1000 mL via INTRAVENOUS
  Filled 2016-01-03 (×6): qty 1000

## 2016-01-03 MED ORDER — FENTANYL CITRATE (PF) 100 MCG/2ML IJ SOLN
INTRAMUSCULAR | Status: AC
  Filled 2016-01-03: qty 2

## 2016-01-03 MED ORDER — FENTANYL CITRATE (PF) 100 MCG/2ML IJ SOLN
25.0000 ug | INTRAMUSCULAR | Status: DC | PRN
  Administered 2016-01-03 (×4): 50 ug via INTRAVENOUS

## 2016-01-03 MED ORDER — ONDANSETRON HCL 4 MG/2ML IJ SOLN
4.0000 mg | INTRAMUSCULAR | Status: DC | PRN
  Administered 2016-01-03: 4 mg via INTRAVENOUS
  Filled 2016-01-03: qty 2

## 2016-01-03 MED ORDER — FENTANYL CITRATE (PF) 100 MCG/2ML IJ SOLN
INTRAMUSCULAR | Status: DC | PRN
  Administered 2016-01-03: 50 ug via INTRAVENOUS
  Administered 2016-01-03: 100 ug via INTRAVENOUS
  Administered 2016-01-03: 50 ug via INTRAVENOUS
  Administered 2016-01-03: 100 ug via INTRAVENOUS
  Administered 2016-01-03: 50 ug via INTRAVENOUS

## 2016-01-03 MED ORDER — PROPOFOL 10 MG/ML IV BOLUS
INTRAVENOUS | Status: DC | PRN
  Administered 2016-01-03: 200 mg via INTRAVENOUS

## 2016-01-03 MED ORDER — CHLORHEXIDINE GLUCONATE 4 % EX LIQD: 60.0000 mL | Freq: Once | CUTANEOUS | Status: DC

## 2016-01-03 MED ORDER — HEPARIN SODIUM (PORCINE) 5000 UNIT/ML IJ SOLN
5000.0000 [IU] | INTRAMUSCULAR | Status: AC
  Administered 2016-01-03: 5000 [IU] via SUBCUTANEOUS
  Filled 2016-01-03: qty 1

## 2016-01-03 MED ORDER — TISSEEL VH 10 ML EX KIT
PACK | CUTANEOUS | Status: DC | PRN
  Administered 2016-01-03: 10 mL

## 2016-01-03 MED ORDER — PHENYLEPHRINE 40 MCG/ML (10ML) SYRINGE FOR IV PUSH (FOR BLOOD PRESSURE SUPPORT)
PREFILLED_SYRINGE | INTRAVENOUS | Status: DC | PRN
  Administered 2016-01-03 (×5): 80 ug via INTRAVENOUS

## 2016-01-03 MED ORDER — OXYCODONE HCL 5 MG/5ML PO SOLN
5.0000 mg | ORAL | Status: DC | PRN
  Administered 2016-01-04 – 2016-01-05 (×4): 5 mg via ORAL
  Filled 2016-01-03: qty 25
  Filled 2016-01-03 (×3): qty 5

## 2016-01-03 MED ORDER — CEFOTETAN DISODIUM-DEXTROSE 2-2.08 GM-% IV SOLR: 2.0000 g | INTRAVENOUS | Status: DC

## 2016-01-03 MED ORDER — MORPHINE SULFATE (PF) 2 MG/ML IV SOLN
2.0000 mg | INTRAVENOUS | Status: DC | PRN
  Administered 2016-01-03 – 2016-01-04 (×5): 2 mg via INTRAVENOUS
  Filled 2016-01-03 (×5): qty 1

## 2016-01-03 MED ORDER — TISSEEL VH 10 ML EX KIT
PACK | CUTANEOUS | Status: AC
  Filled 2016-01-03: qty 1

## 2016-01-03 MED ORDER — INSULIN ASPART 100 UNIT/ML ~~LOC~~ SOLN
6.0000 [IU] | Freq: Once | SUBCUTANEOUS | Status: AC
  Administered 2016-01-03: 6 [IU] via SUBCUTANEOUS

## 2016-01-03 MED ORDER — MIDAZOLAM HCL 5 MG/5ML IJ SOLN
INTRAMUSCULAR | Status: DC | PRN
  Administered 2016-01-03: 2 mg via INTRAVENOUS

## 2016-01-03 MED ORDER — ACETAMINOPHEN 160 MG/5ML PO SOLN: 650.0000 mg | ORAL | Status: DC | PRN

## 2016-01-03 MED ORDER — LACTATED RINGERS IV SOLN
INTRAVENOUS | Status: DC
  Administered 2016-01-03 (×2): via INTRAVENOUS

## 2016-01-03 MED ORDER — INSULIN ASPART 100 UNIT/ML ~~LOC~~ SOLN
0.0000 [IU] | SUBCUTANEOUS | Status: DC
  Administered 2016-01-03 – 2016-01-04 (×6): 4 [IU] via SUBCUTANEOUS
  Administered 2016-01-04: 3 [IU] via SUBCUTANEOUS
  Administered 2016-01-04: 7 [IU] via SUBCUTANEOUS
  Administered 2016-01-04 – 2016-01-05 (×3): 4 [IU] via SUBCUTANEOUS

## 2016-01-03 MED ORDER — BUPIVACAINE HCL (PF) 0.5 % IJ SOLN
INTRAMUSCULAR | Status: AC
  Filled 2016-01-03: qty 30

## 2016-01-03 MED ORDER — LIP MEDEX EX OINT
TOPICAL_OINTMENT | CUTANEOUS | Status: AC
  Filled 2016-01-03: qty 7

## 2016-01-03 MED ORDER — SUGAMMADEX SODIUM 500 MG/5ML IV SOLN
INTRAVENOUS | Status: AC
Start: 2016-01-03 — End: 2016-01-03
  Filled 2016-01-03: qty 5

## 2016-01-03 MED ORDER — CEFOTETAN DISODIUM-DEXTROSE 2-2.08 GM-% IV SOLR
INTRAVENOUS | Status: AC
  Filled 2016-01-03: qty 50

## 2016-01-03 MED ORDER — APREPITANT 40 MG PO CAPS
40.0000 mg | ORAL_CAPSULE | ORAL | Status: AC
  Administered 2016-01-03: 40 mg via ORAL
  Filled 2016-01-03: qty 1

## 2016-01-03 MED ORDER — ONDANSETRON HCL 4 MG/2ML IJ SOLN
INTRAMUSCULAR | Status: DC | PRN
  Administered 2016-01-03: 4 mg via INTRAVENOUS

## 2016-01-03 MED ORDER — BUPIVACAINE HCL (PF) 0.5 % IJ SOLN
INTRAMUSCULAR | Status: DC | PRN
  Administered 2016-01-03: 30 mL

## 2016-01-03 MED ORDER — ROCURONIUM BROMIDE 10 MG/ML (PF) SYRINGE
PREFILLED_SYRINGE | INTRAVENOUS | Status: AC
  Filled 2016-01-03: qty 30

## 2016-01-03 SURGICAL SUPPLY — 62 items
APPLICATOR COTTON TIP 6IN STRL (MISCELLANEOUS) IMPLANT
APPLIER CLIP ROT 10 11.4 M/L (STAPLE)
APPLIER CLIP ROT 13.4 12 LRG (CLIP)
BLADE SURG 15 STRL LF DISP TIS (BLADE) ×1 IMPLANT
BLADE SURG 15 STRL SS (BLADE) ×2
CABLE HIGH FREQUENCY MONO STRZ (ELECTRODE) IMPLANT
CHLORAPREP W/TINT 26ML (MISCELLANEOUS) ×3 IMPLANT
CLIP APPLIE ROT 10 11.4 M/L (STAPLE) IMPLANT
CLIP APPLIE ROT 13.4 12 LRG (CLIP) IMPLANT
COVER SURGICAL LIGHT HANDLE (MISCELLANEOUS) ×3 IMPLANT
DERMABOND ADVANCED (GAUZE/BANDAGES/DRESSINGS) ×2
DERMABOND ADVANCED .7 DNX12 (GAUZE/BANDAGES/DRESSINGS) ×1 IMPLANT
DEVICE SUT QUICK LOAD TK 5 (STAPLE) IMPLANT
DEVICE SUT TI-KNOT TK 5X26 (MISCELLANEOUS) IMPLANT
DEVICE SUTURE ENDOST 10MM (ENDOMECHANICALS) IMPLANT
DEVICE TI KNOT TK5 (MISCELLANEOUS)
DEVICE TROCAR PUNCTURE CLOSURE (ENDOMECHANICALS) ×3 IMPLANT
DISSECTOR BLUNT TIP ENDO 5MM (MISCELLANEOUS) IMPLANT
DRAPE UTILITY XL STRL (DRAPES) ×6 IMPLANT
ELECT REM PT RETURN 9FT ADLT (ELECTROSURGICAL) ×3
ELECTRODE REM PT RTRN 9FT ADLT (ELECTROSURGICAL) ×1 IMPLANT
GAUZE SPONGE 4X4 12PLY STRL (GAUZE/BANDAGES/DRESSINGS) IMPLANT
GLOVE SURG SIGNA 7.5 PF LTX (GLOVE) ×3 IMPLANT
GOWN STRL REUS W/TWL XL LVL3 (GOWN DISPOSABLE) ×9 IMPLANT
HOVERMATT SINGLE USE (MISCELLANEOUS) ×3 IMPLANT
IRRIG SUCT STRYKERFLOW 2 WTIP (MISCELLANEOUS) ×3
IRRIGATION SUCT STRKRFLW 2 WTP (MISCELLANEOUS) ×1 IMPLANT
KIT BASIN OR (CUSTOM PROCEDURE TRAY) ×3 IMPLANT
MARKER SKIN DUAL TIP RULER LAB (MISCELLANEOUS) ×3 IMPLANT
NEEDLE SPNL 22GX3.5 QUINCKE BK (NEEDLE) ×3 IMPLANT
PACK UNIVERSAL I (CUSTOM PROCEDURE TRAY) ×3 IMPLANT
QUICK LOAD TK 5 (STAPLE)
RELOAD STAPLER BLUE 60MM (STAPLE) IMPLANT
RELOAD STAPLER GOLD 60MM (STAPLE) ×6 IMPLANT
RELOAD STAPLER GREEN 60MM (STAPLE) ×2 IMPLANT
SCISSORS LAP 5X35 DISP (ENDOMECHANICALS) ×3 IMPLANT
SEALANT SURGICAL APPL DUAL CAN (MISCELLANEOUS) ×3 IMPLANT
SHEARS HARMONIC ACE PLUS 45CM (MISCELLANEOUS) ×3 IMPLANT
SLEEVE ADV FIXATION 5X100MM (TROCAR) ×3 IMPLANT
SLEEVE GASTRECTOMY 36FR VISIGI (MISCELLANEOUS) ×3 IMPLANT
SOLUTION ANTI FOG 6CC (MISCELLANEOUS) ×3 IMPLANT
SPONGE LAP 18X18 X RAY DECT (DISPOSABLE) ×3 IMPLANT
STAPLER ECHELON LONG 60 440 (INSTRUMENTS) ×3 IMPLANT
STAPLER RELOAD BLUE 60MM (STAPLE)
STAPLER RELOAD GOLD 60MM (STAPLE) ×18
STAPLER RELOAD GREEN 60MM (STAPLE) ×6
SUT MNCRL AB 4-0 PS2 18 (SUTURE) ×3 IMPLANT
SUT SURGIDAC NAB ES-9 0 48 120 (SUTURE) IMPLANT
SUT VICRYL 0 TIES 12 18 (SUTURE) ×3 IMPLANT
SYR 10ML ECCENTRIC (SYRINGE) ×3 IMPLANT
SYR 20CC LL (SYRINGE) ×3 IMPLANT
TOWEL OR 17X26 10 PK STRL BLUE (TOWEL DISPOSABLE) ×3 IMPLANT
TOWEL OR NON WOVEN STRL DISP B (DISPOSABLE) ×3 IMPLANT
TROCAR ADV FIXATION 12X100MM (TROCAR) ×3 IMPLANT
TROCAR ADV FIXATION 5X100MM (TROCAR) ×3 IMPLANT
TROCAR BLADELESS 15MM (ENDOMECHANICALS) ×3 IMPLANT
TROCAR BLADELESS OPT 5 100 (ENDOMECHANICALS) ×3 IMPLANT
TROCAR XCEL 12X100 BLDLESS (ENDOMECHANICALS) IMPLANT
TUBING CONNECTING 10 (TUBING) ×2 IMPLANT
TUBING CONNECTING 10' (TUBING) ×1
TUBING ENDO SMARTCAP PENTAX (MISCELLANEOUS) ×3 IMPLANT
TUBING INSUF HEATED (TUBING) ×3 IMPLANT

## 2016-01-03 NOTE — Op Note (Signed)
Preoperative diagnosis: laparoscopic sleeve gastrectomy  Postoperative diagnosis: Same   Procedure: Upper endoscopy   Surgeon: Phylliss Blakeshelsea Gwen Edler, M.D.  Anesthesia: Gen.   Indications for procedure: This patient was undergoing a laparoscopic sleeve gastrectomy.   Description of procedure: The endoscopy was placed in the mouth and into the oropharynx and under endoscopic vision it was advanced to the esophagogastric junction. The pouch was insufflated and no bleeding or bubbles were seen.There was retained foodstuffs in the stomach. The GEJ was identified at 42cm from the teeth. No bleeding or leaks were detected. The scope was withdrawn without difficulty.   Phylliss Blakeshelsea Dejuan Elman, M.D. General, Bariatric, & Minimally Invasive Surgery [facility]

## 2016-01-03 NOTE — Discharge Instructions (Addendum)
Aprepitant Discharge Instructions  On the day of surgery you were given the medication aprepitant. This medication interacts with hormonal forms of birth control (oral contraceptives and injected or implanted birth control) and may make them ineffective. IF YOU USE ANY HORMONAL FORM OF BIRTH CONTROL, YOU MUST USE AN ADDITIONAL BARRIER BIRTH CONTROL METHOD FOR ONE MONTH after receiving aprepitant or there is a chance you could become pregnant.   [facility] - DISCHARGE INSTRUCTIONS TO PATIENT  Activity:  Driving - May drive in 4 or 5 days, if doing well   Lifting - No lifting more than 15 pounds for 10 days.  Wound Care:   May shower  Diet:  Post sleeve diet  Follow up appointment:  Call Dr. Allene PyoNewman's office [facility] at [phone] for an appointment in 2 weeks.        To see Dr. Woodward KuAvbery within 2 weeks.  Medications and dosages:  Resume your home medications.             Follow blood sugars closely.   Call Dr. Ezzard StandingNewman or his office  [phone]) if you have:  Temperature greater than 100.4,  Persistent nausea and vomiting,  Severe uncontrolled pain,  Redness, tenderness, or signs of infection (pain, swelling, redness, odor or green/yellow discharge around the site),  Difficulty breathing, headache or visual disturbances,  Any other questions or concerns you may have after discharge.  In an emergency, call 911 or go to an Emergency Department at a nearby hospital.       GASTRIC BYPASS/SLEEVE  Home Care Instructions   These instructions are to help you care for yourself when you go home.  Call: If you have any problems.  Call [phone] and ask for the surgeon on call  If you need immediate assistance [facility]. Tell the ER staff you are a new post-op gastric bypass or gastric sleeve patient  Signs and symptoms to report:  Severe  vomiting or nausea o If you cannot handle clear liquids for longer than 1 day, call your  surgeon  Abdominal pain which does not get better after taking your pain medication  Fever greater than 100.4  F and chills  Heart rate over 100 beats a minute  Trouble breathing  Chest pain  Redness,  swelling, drainage, or foul odor at incision (surgical) sites  If your incisions open or pull apart  Swelling or pain in calf (lower leg)  Diarrhea (Loose bowel movements that happen often), frequent watery, uncontrolled bowel movements  Constipation, (no bowel movements for 3 days) if this happens: o Take Milk of Magnesia, 2 tablespoons by mouth, 3 times a day for 2 days if needed o Stop taking Milk of Magnesia once you have had a bowel movement o Call your doctor if constipation continues Or o Take Miralax  (instead of Milk of Magnesia) following the label instructions o Stop taking Miralax once you have had a bowel movement o Call your doctor if constipation continues  Anything you think is abnormal for you   Normal side effects after surgery:  Unable to sleep at night or unable to concentrate  Irritability  Being tearful (crying) or depressed  These are common complaints, possibly related to your anesthesia, stress of surgery, and change in lifestyle, that usually go away a few weeks after surgery. If these feelings continue, call your medical doctor.  Wound Care: You may have surgical glue, steri-strips, or staples over your incisions after surgery  Surgical glue: Looks like clear film over your incisions and will wear off a little at a time  Steri-strips: Adhesive strips of tape over your incisions. You may notice a yellowish color on skin under the steri-strips. This is used to make the steri-strips stick better. Do not pull the steri-strips off - let them fall off  Staples: Staples may be removed before you leave the hospital o If you go home with staples, call [facility] for an appointment with your surgeons nurse to have staples removed 10 days  after surgery, [phone]  Showering: You may shower two (2) days after your surgery unless your surgeon tells you differently o Wash gently around incisions with warm soapy water, rinse well, and gently pat dry o If you have a drain (tube from your incision), you may need someone to hold this while you shower o No tub baths until staples are removed and incisions are healed   Medications:  Medications should be liquid or crushed if larger than the size of a dime  Extended release pills (medication that releases a little bit at a time through the  day) should not be crushed  Depending on the size and number of medications you take, you may need to space (take a few throughout the day)/change the time you take your medications so that you do not over-fill your pouch (smaller stomach)  Make sure you follow-up with you primary care physician to make medication changes needed during rapid weight loss and life -style changes  If you have diabetes, follow up with your doctor that orders your diabetes medication(s) within one week after surgery and check your blood sugar regularly   Do not drive while taking narcotics (pain medications)   Do not take acetaminophen (Tylenol) and Roxicet or Lortab Elixir at the same time since these pain medications contain acetaminophen   Diet:  First 2 Weeks You will see the nutritionist about two (2) weeks after your surgery. The nutritionist will increase the types of foods you can eat if you are handling liquids well:  If you have severe vomiting or nausea and cannot handle clear liquids lasting longer than 1 day call your surgeon Protein Shake  Drink at least 2 ounces of shake 5-6 times per day  Each serving of protein shakes (usually 8-12 ounces) should have a minimum of: o 15 grams of protein o And no more than 5 grams of carbohydrate  Goal for protein each day: o Men = 80 grams per day o Women = 60 grams per day     Protein powder may be  added to fluids such as non-fat milk or Lactaid milk or Soy milk (limit to 35 grams added protein powder per serving)  Hydration  Slowly increase the amount of water and other clear liquids as tolerated (See Acceptable Fluids)  Slowly increase the amount of protein shake as tolerated  Sip fluids slowly and throughout the day  May use sugar substitutes in small amounts (no more than 6-8 packets per day; i.e. Splenda)  Fluid Goal  The first goal is to drink at least 8 ounces of protein shake/drink per day (or as directed by the nutritionist); some examples of protein shakes are [employer], [employer], EAS Edge HP, and Unjury. - See handout from pre-op Bariatric Education Class: o Slowly increase the amount of protein shake you drink as tolerated o You may find it easier to slowly sip shakes throughout the day o It is important  to get your proteins in first  Your fluid goal is to drink 64-100 ounces of fluid daily o It may take a few weeks to build up to this   32 oz. (or more) should be clear liquids And  32 oz. (or more) should be full liquids (see below for examples)  Liquids should not contain sugar, caffeine, or carbonation  Clear Liquids:  Water of Sugar-free flavored water (i.e. Fruit HO, Propel)  Decaffeinated coffee or tea (sugar-free)  Crystal lite, Wylers Lite, Minute Maid Lite  Sugar-free Jell-O  Bouillon or broth  Sugar-free Popsicle:    - Less than 20 calories each; Limit 1 per day  Full Liquids:                   Protein Shakes/Drinks + 2 choices per day of other full liquids  Full liquids must be: o No More Than 12 grams of Carbs per serving o No More Than 3 grams of Fat per serving  Strained low-fat cream soup  Non-Fat milk  Fat-free Lactaid Milk  Sugar-free yogurt (Dannon Lite & Fit, Greek yogurt)    Vitamins and Minerals  Start 1 day after surgery unless otherwise directed by your surgeon  2 Chewable Multivitamin / Multimineral  Supplement with iron (i.e. Centrum for Adults)  Vitamin B-12, 350-500 micrograms sub-lingual (place tablet under the tongue) each day  Chewable Calcium Citrate with Vitamin D-3 (Example: 3 Chewable Calcium  Plus 600 with Vitamin D-3) o Take 500 mg three (3) times a day for a total of 1500 mg each day o Do not take all 3 doses of calcium at one time as it may cause constipation, and you can only absorb 500 mg at a time o Do not mix multivitamins containing iron with calcium supplements;  take 2 hours apart o Do not substitute Tums (calcium carbonate) for your calcium  Menstruating women and those at risk for anemia ( a blood disease that causes weakness) may need extra iron o Talk to your doctor to see if you need more iron  If you need extra iron: Total daily Iron recommendation (including Vitamins) is 50 to 100 mg Iron/day  Do not stop taking or change any vitamins or minerals until you talk to your nutritionist or surgeon  Your nutritionist and/or surgeon must approve all vitamin and mineral supplements   Activity and Exercise: It is important to continue walking at home. Limit your physical activity as instructed by your doctor. During this time, use these guidelines:  Do not lift anything greater than ten  (10) pounds for at least two (2) weeks  Do not go back to work or drive until Designer, industrial/product says you can  You may have sex when you feel comfortable o It is VERY important for female patients to use a reliable birth control method; fertility often increase after surgery o Do not get pregnant for at least 18 months  Start exercising as soon as your doctor tells you that you can o Make sure your doctor approves any physical activity  Start with a simple walking program  Walk 5-15 minutes each day, 7 days per week  Slowly increase until you are walking 30-45 minutes per day  Consider joining our BELT program. [phone] or email [email or web address]   Special Instructions  Things to remember:  Free counseling is available for you and your family through collaboration between [facility] and [place]. Please call [phone] and leave a message  Use your CPAP when  sleeping if this applies to you  Consider buying a medical alert bracelet that says you had lap-band surgery     You will likely have your first fill (fluid added to your band) 6 - 8 weeks after surgery  [facility] has a free Bariatric Surgery Support Group that meets monthly, the 3rd Thursday, 6pm. Calvert Cantor. You can see classes online at [email or web address]  It is very important to keep all follow up appointments with your surgeon, nutritionist, primary care physician, and behavioral health practitioner o After the first year, please follow up with your bariatric surgeon and nutritionist at least once a year in order to maintain best weight loss results                    [facility]:  [phone]               [facility]: [phone]               Bariatric Nurse Coordinator: [phone]  Gastric Bypass/Sleeve Home Care Instructions  Rev. 04/2012                                                         Reviewed and Endorsed                                                    by [facility] Patient Education Committee, Jan, 2014

## 2016-01-03 NOTE — Transfer of Care (Signed)
Immediate Anesthesia Transfer of Care Note  Patient: Andreas BlowerFrances Y Shedd  Procedure(s) Performed: Procedure(s): LAPAROSCOPIC GASTRIC SLEEVE RESECTION WITH UPPER ENDO (N/A)  Patient Location: PACU  Anesthesia Type:General  Level of Consciousness: awake, alert  and oriented  Airway & Oxygen Therapy: Patient Spontanous Breathing and Patient connected to face mask oxygen  Post-op Assessment: Report given to RN and Post -op Vital signs reviewed and stable  Post vital signs: Reviewed and stable  Last Vitals:  Vitals:   01/03/16 0744  BP: (!) 145/73  Pulse: 87  Resp: 18  Temp: 37.1 C    Last Pain:  Vitals:   01/03/16 0744  TempSrc: Oral         Complications: No apparent anesthesia complications

## 2016-01-03 NOTE — Anesthesia Procedure Notes (Signed)
Procedure Name: Intubation Performed by: Kizzie FantasiaARVER, Cadi Rhinehart J Pre-anesthesia Checklist: Patient identified, Emergency Drugs available, Suction available, Patient being monitored and Timeout performed Patient Re-evaluated:Patient Re-evaluated prior to inductionOxygen Delivery Method: Circle system utilized Preoxygenation: Pre-oxygenation with 100% oxygen Intubation Type: IV induction Ventilation: Mask ventilation without difficulty Laryngoscope Size: Mac and 4 Grade View: Grade I Tube type: Oral Tube size: 7.0 mm Number of attempts: 2 Airway Equipment and Method: Stylet Placement Confirmation: ETT inserted through vocal cords under direct vision,  positive ETCO2,  CO2 detector and breath sounds checked- equal and bilateral Secured at: 21 cm Tube secured with: Tape Dental Injury: Teeth and Oropharynx as per pre-operative assessment

## 2016-01-03 NOTE — Anesthesia Preprocedure Evaluation (Addendum)
Anesthesia Evaluation  Patient identified by MRN, date of birth, ID band Patient awake  General Assessment Comment:Past Medical History: 1. HTN x 2009 2. She tested negative for sleep apnea in 2013. 3. Musculoskeletal - arthritis She has had both feet operated on - left 03/26/2014 and right 07/17/2013.  Sees Dr. Al CorpusHyatt 4. Diabetes since 2015. On Trilicity and Mefformin Her HgbA1C - 8.1 - 10/21/2014 5. Legally blind in both eyes, right worse than left - she thinks that it is part of macular degeneration Sees Dr. Luciana Axeankin, though what he has done recenly has made things worse 6. 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 7. Gout x 6 years. Last attack in 2012 8. 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    Pulmonary former smoker,    Pulmonary exam normal breath sounds clear to auscultation       Cardiovascular hypertension, Pt. on medications Normal cardiovascular exam Rhythm:Regular Rate:Normal     Neuro/Psych Impaired vision. Retinal hemorrhage.  Mononeuritis  Neuromuscular disease negative psych ROS   GI/Hepatic Neg liver ROS, GERD  ,  Endo/Other  diabetes, Poorly Controlled, Type 2, Oral Hypoglycemic AgentsMorbid obesity  Renal/GU negative Renal ROS  negative genitourinary   Musculoskeletal  (+) Arthritis ,   Abdominal (+) + obese,   Peds negative pediatric ROS (+)  Hematology  (+) anemia ,   Anesthesia Other Findings   Reproductive/Obstetrics negative OB ROS                          Anesthesia Physical Anesthesia Plan  ASA: III  Anesthesia Plan: General   Post-op Pain Management:    Induction: Intravenous  Airway Management Planned: Oral  ETT  Additional Equipment:   Intra-op Plan:   Post-operative Plan: Extubation in OR  Informed Consent: I have reviewed the patients History and Physical, chart, labs and discussed the procedure including the risks, benefits and alternatives for the proposed anesthesia with the patient or authorized representative who has indicated his/her understanding and acceptance.   Dental advisory given  Plan Discussed with: CRNA  Anesthesia Plan Comments:         Anesthesia Quick Evaluation

## 2016-01-03 NOTE — Interval H&P Note (Signed)
History and Physical Interval Note:  01/03/2016 8:58 AM  Lacey BlowerFrances Y Dittus  has presented today for surgery, with the diagnosis of MORBID OBESITY  The various methods of treatment have been discussed with the patient and family.   Sister and boyfriend, Perlie GoldRussell, here with patient.  After consideration of risks, benefits and other options for treatment, the patient has consented to  Procedure(s): LAPAROSCOPIC GASTRIC SLEEVE RESECTION WITH UPPER ENDO (N/A) as a surgical intervention .  The patient's history has been reviewed, patient examined, no change in status, stable for surgery.  I have reviewed the patient's chart and labs.  Questions were answered to the patient's satisfaction.     Ranald Alessio H

## 2016-01-03 NOTE — Anesthesia Postprocedure Evaluation (Addendum)
Anesthesia Post Note  Patient: Lacey Meyer  Procedure(s) Performed: Procedure(s) (LRB): LAPAROSCOPIC GASTRIC SLEEVE RESECTION WITH UPPER ENDO (N/A)  Patient location during evaluation: PACU Anesthesia Type: General Level of consciousness: awake and alert Pain management: pain level controlled Vital Signs Assessment: post-procedure vital signs reviewed and stable Respiratory status: spontaneous breathing, nonlabored ventilation, respiratory function stable and patient connected to nasal cannula oxygen Cardiovascular status: blood pressure returned to baseline and stable Postop Assessment: no signs of nausea or vomiting Anesthetic complications: no Comments: Insulin given for elevated blood glucose.    Last Vitals:  Vitals:   01/03/16 1534 01/03/16 1653  BP: 111/64 124/63  Pulse: 82 95  Resp: 18 18  Temp: 37 C     Last Pain:  Vitals:   01/03/16 1534  TempSrc: Oral  PainSc:                  Ayham Word J

## 2016-01-03 NOTE — Op Note (Addendum)
PATIENT:   Lacey Meyer DOB:   08/12/1960 MRN:   [id number]  DATE OF PROCEDURE: 01/03/2016                   FACILITY:  [facility]  OPERATIVE REPORT  PREOPERATIVE DIAGNOSIS:  Morbid obesity.  POSTOPERATIVE DIAGNOSIS:  Morbid obesity (weight 263, BMI of 42.5).  Retained food in stomach. [Photo at end of note]  PROCEDURE:  Laparoscopic Sleeve gastrectomy (intraoperative upper endoscopy by Dr. Milford Meyer. [facility])  SURGEON:  Lacey Balesavid H. Ezzard StandingNewman, MD  FIRST ASSISTANMilford Meyer:  C. [facility], M.D.  ANESTHESIA:  General endotracheal.  Anesthesiologist: Lacey DiversBruce Denenny, MD CRNA: Lacey SoursKaren L Walker, CRNA; Lacey FantasiaKelley J Carver, CRNA  General  ESTIMATED BLOOD LOSS:  Minimal.  LOCAL ANESTHESIA:  30 cc of 1/4% Marcaine  COMPLICATIONS:  None.  INDICATION FOR SURGERY:  Lacey Meyer is a 55 y.o. AA female who sees Lacey GermanEdwin A Avbuere, MD as her primary care doctor.  She has completed our preoperative bariatric program and now comes for a laparoscopic sleeve gastrectomy.  The indications, potential complications of surgery were explained to the patient.  Potential complications of the surgery include, but are not limited to, bleeding, infection, DVT, open surgery, and long-term nutritional consequences.  OPERATIVE NOTE:  The patient taken to room #1 at [facility] where Ms. Lacey Meyer underwent a general endotracheal anesthetic, supervised by Anesthesiologist: Lacey DiversBruce Denenny, MD CRNA: Lacey SoursKaren L Walker, CRNA; Lacey FantasiaKelley J Carver, CRNA.  The patient was given 2 g of cefotetan at the beginning of the procedure.  A time-out was held and surgical checklist run.  I accessed her abdominal cavity through the left upper quadrant with a 5 mm Optiview. I did an abdominal exploration.   Her omentum and bowel were unremarkable. The right and left lobes of the liver unremarkable, but the liver was large. Gallbladder was normal. Her stomach was unremarkable, though appeared somewhat thickened.  I placed a total of 6 trocars. I placed a 5 mm left lateral trocar, a 5  mm left paramedian trocar (for the scope), a 15 mm right paramedian torcar, a 5 mm right subcostal trocar, and 5 mm subxiphoid trocar for the liver retractor.  I started out taking down the greater curvature attachments of the stomach. I measured approximately 6 cm proximal from the pylorus and mobilized the greater curvature of the stomach with the Harmonic Scalpel. I took this dissection cranially around the greater curvature of her stomach to the angle of His and the left crus.   After I had mobilized the greater curvature of the stomach, I then passed the 36 French ViSiGi bougie which was used to suck the stomach up against the lesser curvature and placed into the antrum. During the staple firing,  I tried to give the ViSiGi a cuff at least about 1 cm. I tried to avoid narrowing the incisura. I used a total of 7 staple firings.  From antrum to the angle of His, I used 2 green, 5 gold and 0 blue Eschelon 60 mm Ethicon staplers. I did not use staple line reinforcement.   At each firing of the EndoGIA stapler, I inspected the stomach, anterior wall of the stomach, and underneath to make sure there was no compromise or impingement on to the ViSiGi bougie.   The staple line seemed linear without any corkscrewing of the stomach. Hemostasis was good. I did not use any reinforcement. She did have at least 0 areas of bleeding on the new greater curvature of the stomach.  Because  I thought we had a good staple line, I then had the ViSiGi was converted to insufflate the pouch. The new stomach pouch was placed under water. There was no bubbling or leak noted.   At this point, Dr. Fredricka Meyer broke scrub and passed an upper endoscope down through the esophagus into the stomach pouch. The stomach pouch had a lot of retained food that looks like greens. This limited some of the visualization of the stomach.  The stomach was tubular. There was no narrowing of the stomach pouch or angulation. We were easily able to pass the  endoscope into the antrum and again put air pressure on the staple line. I irrigated the upper abdomen with saline. There was no bubbling or evidence of air leak. The mucosa looked viable. Dr. Eustaquio Meyer decompressed the stomach with the endoscopy.   I  extracted the stomach remnant through the right paramedian trocar and was sent this to Pathology. I then placed 10 cc of Tisseel along the new greater curvature staple line and covered the entire staple line with the Tisseel.  I aspirated out the saline that I had irrigated because I thought the staple line looked viable and complete. There was no evidence of leak. I did not leave a drain in place.   Then, I closed the trocar sites. I placed 0 Vicryl sutures at the 15-mm port site in the right paramedian incision. The other port sites seemed smaller not requiring sutures. I closed the skin at each site with a 4-0 Monocryl, painted each wound with LiquiBand.   The patient was transported to recovery room in good condition. Sponge and needle count were correct at the end of the case.   (It was unclear why she had some much retain food in her stomach.  She said her last meal was last PM.  She denies eating today.  Her UGI on 10/03/2015 showed no retained food.)    Retained food in stomach.   Lacey Kin, MD, [facility] Pager: [phone] Office phone:  [phone]

## 2016-01-04 LAB — CBC WITH DIFFERENTIAL/PLATELET
Basophils Absolute: 0 10*3/uL (ref 0.0–0.1)
Basophils Relative: 0 %
EOS ABS: 0 10*3/uL (ref 0.0–0.7)
Eosinophils Relative: 0 %
HEMATOCRIT: 36.2 % (ref 36.0–46.0)
HEMOGLOBIN: 11.9 g/dL — AB (ref 12.0–15.0)
LYMPHS ABS: 2.4 10*3/uL (ref 0.7–4.0)
LYMPHS PCT: 23 %
MCH: 28.6 pg (ref 26.0–34.0)
MCHC: 32.9 g/dL (ref 30.0–36.0)
MCV: 87 fL (ref 78.0–100.0)
MONOS PCT: 9 %
Monocytes Absolute: 0.9 10*3/uL (ref 0.1–1.0)
NEUTROS PCT: 68 %
Neutro Abs: 6.9 10*3/uL (ref 1.7–7.7)
Platelets: 210 10*3/uL (ref 150–400)
RBC: 4.16 MIL/uL (ref 3.87–5.11)
RDW: 15.2 % (ref 11.5–15.5)
WBC: 10.2 10*3/uL (ref 4.0–10.5)

## 2016-01-04 LAB — GLUCOSE, CAPILLARY
GLUCOSE-CAPILLARY: 171 mg/dL — AB (ref 65–99)
GLUCOSE-CAPILLARY: 186 mg/dL — AB (ref 65–99)
GLUCOSE-CAPILLARY: 212 mg/dL — AB (ref 65–99)
Glucose-Capillary: 170 mg/dL — ABNORMAL HIGH (ref 65–99)
Glucose-Capillary: 182 mg/dL — ABNORMAL HIGH (ref 65–99)
Glucose-Capillary: 188 mg/dL — ABNORMAL HIGH (ref 65–99)

## 2016-01-04 LAB — HEMOGLOBIN AND HEMATOCRIT, BLOOD
HCT: 38.9 % (ref 36.0–46.0)
HEMOGLOBIN: 12.8 g/dL (ref 12.0–15.0)

## 2016-01-04 MED ORDER — METOPROLOL TARTRATE 5 MG/5ML IV SOLN: 5.0000 mg | Freq: Four times a day (QID) | INTRAVENOUS | Status: DC

## 2016-01-04 MED ORDER — METOPROLOL TARTRATE 5 MG/5ML IV SOLN
5.0000 mg | Freq: Four times a day (QID) | INTRAVENOUS | Status: DC | PRN
  Administered 2016-01-04: 5 mg via INTRAVENOUS
  Filled 2016-01-04: qty 5

## 2016-01-04 NOTE — Plan of Care (Signed)
Problem: Food- and Nutrition-Related Knowledge Deficit (NB-1.1) Goal: Nutrition education Formal process to instruct or train a patient/client in a skill or to impart knowledge to help patients/clients voluntarily manage or modify food choices and eating behavior to maintain or improve health. Outcome: Completed/Met Date Met: 01/04/16 Nutrition Education Note  Received consult for diet education per DROP protocol.   Discussed 2 week post op diet with pt. Emphasized that liquids must be non carbonated, non caffeinated, and sugar free. Fluid goals discussed. Reviewed progression of diet to include soft proteins at 7-10 days post-op. Pt to follow up with outpatient bariatric RD for further diet progression after 2 weeks. Multivitamins and minerals also reviewed. Teach back method used, pt expressed understanding, expect good compliance.   Diet: First 2 Weeks  You will see the dietitian about two (2) weeks after your surgery. The dietitian will increase the types of foods you can eat if you are handling liquids well:  If you have severe vomiting or nausea and cannot handle clear liquids lasting longer than 1 day, call your surgeon  Protein Shake  Drink at least 2 ounces of shake 5-6 times per day  Each serving of protein shakes (usually 8 - 12 ounces) should have a minimum of:  15 grams of protein  And no more than 5 grams of carbohydrate  Goal for protein each day:  Men = 80 grams per day  Women = 60 grams per day  Protein powder may be added to fluids such as non-fat milk or Lactaid milk or Soy milk (limit to 35 grams added protein powder per serving)   Hydration  Slowly increase the amount of water and other clear liquids as tolerated (See Acceptable Fluids)  Slowly increase the amount of protein shake as tolerated  Sip fluids slowly and throughout the day  May use sugar substitutes in small amounts (no more than 6 - 8 packets per day; i.e. Splenda)   Fluid Goal  The first goal is to  drink at least 8 ounces of protein shake/drink per day (or as directed by the nutritionist); some examples of protein shakes are [employer], [employer], EAS Edge HP, and Unjury. See handout from pre-op Bariatric Education Class:  Slowly increase the amount of protein shake you drink as tolerated  You may find it easier to slowly sip shakes throughout the day  It is important to get your proteins in first  Your fluid goal is to drink 64 - 100 ounces of fluid daily  It may take a few weeks to build up to this  32 oz (or more) should be clear liquids  And  32 oz (or more) should be full liquids (see below for examples)  Liquids should not contain sugar, caffeine, or carbonation   Clear Liquids:  Water or Sugar-free flavored water (i.e. Fruit H2O, Propel)  Decaffeinated coffee or tea (sugar-free)  Crystal Lite, Wyler's Lite, Minute Maid Lite  Sugar-free Jell-O  Bouillon or broth  Sugar-free Popsicle: *Less than 20 calories each; Limit 1 per day   Full Liquids:  Protein Shakes/Drinks + 2 choices per day of other full liquids  Full liquids must be:  No More Than 12 grams of Carbs per serving  No More Than 3 grams of Fat per serving  Strained low-fat cream soup  Non-Fat milk  Fat-free Lactaid Milk  Sugar-free yogurt (Dannon Lite & Fit, Greek yogurt)     Clayton Bibles, MS, RD, LDN Pager: [phone] After Hours Pager: [phone]

## 2016-01-04 NOTE — Progress Notes (Signed)
[facility] Office:  [phone] General Surgery Progress Note   LOS: 1 day  POD -  1 Day Post-Op  Assessment/Plan: 1.  LAPAROSCOPIC GASTRIC SLEEVE RESECTION WITH UPPER ENDO  For MORBID OBESITY WITH BMI OF 40.0-44.9, ADULT (E66.01)  A lot of retain food in stomach at surgery.     Looks good - will start water.           2. HTN x 2009 3. Musculoskeletal - arthritis             She has had both feet operated on - left 03/26/2014 and right 07/17/2013.              Sees Dr. Al CorpusHyatt 4. Diabetes since 2015.             On Trilicity and Mefformin             Her HgbA1C - 8.1 - 10/21/2014  --> 10.0 on 12/29/2015 5. Legally blind in both eyes, right worse than left - she thinks that it is part of macular degeneration         Sees Dr. Luciana Axeankin, though what he has done recenly has made things worse 6. 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 7. Gout x 6 years.       Last attack in 2012 8. 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  DVT prophylaxis - SQ Heparin   Active Problems:   Morbid obesity (HCC)  Subjective:  Has done very well.  Will start water today.  Objective:   Vitals:   01/04/16 0055 01/04/16 0555  BP: (!) 146/57 (!) 146/70  Pulse: (!) 104 (!) 103  Resp: 18 18  Temp: 99.9 F (37.7 C) 99.2 F (37.3 C)     Intake/Output from previous day:  10/24 0701 - 10/25 0700 In: 2800 [I.V.:2800] Out: 1325 [Urine:1300; Blood:25]  Intake/Output this shift:  No intake/output data recorded.   Physical Exam:   General: Obese AA F who is alert and oriented.    HEENT: Normal. Pupils equal. .   Lungs: Clear.  IS =1,200 cc   Abdomen: Soft.  Rare BS.   Wound: Clean   Lab Results:    Recent Labs  01/03/16 1707 01/04/16 0443  WBC  --  10.2  HGB 13.0 11.9*  HCT 39.5 36.2  PLT  --  210    BMET  No results for input(s): NA, K, CL, CO2, GLUCOSE, BUN, CREATININE, CALCIUM in the last 72 hours.  PT/INR  No results for input(s):  LABPROT, INR in the last 72 hours.  ABG  No results for input(s): PHART, HCO3 in the last 72 hours.  Invalid input(s): PCO2, PO2   Studies/Results:  No results found.   Anti-infectives:   Anti-infectives    Start     Dose/Rate Route Frequency Ordered Stop   01/03/16 0912  cefoTEtan in Dextrose 5% (CEFOTAN) IVPB 2 g  Status:  Discontinued     2 g Intravenous On call to O.R. 01/03/16 0913 01/03/16 1358   01/03/16 0737  cefoTEtan in Dextrose 5% (CEFOTAN) IVPB 2 g     2 g Intravenous On call to O.R. 01/03/16 0737 01/03/16 [id number]      Ovidio Kinavid Maple Odaniel, MD, FACS Pager: [phone] [facility] Office: [phone] 01/04/2016

## 2016-01-04 NOTE — Progress Notes (Signed)
Patient alert and oriented, Post op day 1.  Provided support and encouragement.  Encouraged pulmonary toilet, ambulation and small sips of liquids.  All questions answered.  Will continue to monitor. 

## 2016-01-05 LAB — CBC WITH DIFFERENTIAL/PLATELET
Basophils Absolute: 0 10*3/uL (ref 0.0–0.1)
Basophils Relative: 0 %
EOS PCT: 1 %
Eosinophils Absolute: 0.1 10*3/uL (ref 0.0–0.7)
HEMATOCRIT: 35.4 % — AB (ref 36.0–46.0)
Hemoglobin: 11.8 g/dL — ABNORMAL LOW (ref 12.0–15.0)
LYMPHS ABS: 2.5 10*3/uL (ref 0.7–4.0)
LYMPHS PCT: 27 %
MCH: 28.9 pg (ref 26.0–34.0)
MCHC: 33.3 g/dL (ref 30.0–36.0)
MCV: 86.6 fL (ref 78.0–100.0)
MONO ABS: 0.8 10*3/uL (ref 0.1–1.0)
MONOS PCT: 9 %
NEUTROS ABS: 5.7 10*3/uL (ref 1.7–7.7)
Neutrophils Relative %: 63 %
PLATELETS: 191 10*3/uL (ref 150–400)
RBC: 4.09 MIL/uL (ref 3.87–5.11)
RDW: 15 % (ref 11.5–15.5)
WBC: 9.1 10*3/uL (ref 4.0–10.5)

## 2016-01-05 LAB — GLUCOSE, CAPILLARY
Glucose-Capillary: 185 mg/dL — ABNORMAL HIGH (ref 65–99)
Glucose-Capillary: 188 mg/dL — ABNORMAL HIGH (ref 65–99)

## 2016-01-05 NOTE — Discharge Summary (Signed)
Physician Discharge Summary  Patient ID:  Lacey Meyer  MRN: [id number]  DOB/AGE: 57-Nov-1962 57 y.o.  Admit date: 01/03/2016 Discharge date: 01/05/2016  Discharge Diagnoses:  1.  MORBID OBESITY WITH BMI OF 40.0-44.9, ADULT (E66.01)          Weight - 263, BMI - 42.5 2. HTN x 2009 3. Musculoskeletal - arthritis             She has had both feet operated on - left 03/26/2014 and right 07/17/2013.              Sees Dr. Al Corpus 4. Diabetes since 2015.             On Trilicity and Mefformin 5. Legally blind in both eyes, right worse than left - she thinks that it is part of macular degeneration         Sees Dr. Luciana Axe, though what he has done recenly has made things worse 6. 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 7. Gout x 6 years.       Last attack in 2012 8. 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  Discharged Condition: good  Hospital Course: LIESELOTTE HESSELTINE is an 55 y.o. female whose primary care physician is Dorrene German, MD and who was admitted 01/03/2016 with a chief complaint of morbid obesity.   She was brought to the operating room on 01/03/2016 and underwent  LAPAROSCOPIC GASTRIC SLEEVE RESECTION WITH UPPER ENDO.   She was noted to have a large amount of retained greens in her stomach pouch.  In talking to her post op, she went about 3 weeks without her diabetes meds and her blood sugars were high.  Her preop Hgb A1c was elevated - whether this was the cause is unclear and whether her poorly controlled DM had something to do with the gastric retention is unclear.  Anyway, she has done well.  She is tolerating her protein drinks and ready to go home.  The discharge instructions were reviewed with the patient.  Consults: None  Significant Diagnostic Studies: Results for orders placed or  performed during the hospital encounter of 01/03/16  Glucose, capillary  Result Value Ref Range   Glucose-Capillary 189 (H) 65 - 99 mg/dL  Glucose, capillary  Result Value Ref Range   Glucose-Capillary 243 (H) 65 - 99 mg/dL   Comment 1 Notify RN    Comment 2 Document in Chart   Glucose, capillary  Result Value Ref Range   Glucose-Capillary 264 (H) 65 - 99 mg/dL   Comment 1 Document in Chart   Hemoglobin and hematocrit, blood  Result Value Ref Range   Hemoglobin 13.0 12.0 - 15.0 g/dL   HCT 40.9 81.1 - 91.4 %  CBC WITH DIFFERENTIAL  Result Value Ref Range   WBC 10.2 4.0 - 10.5 K/uL   RBC 4.16 3.87 - 5.11 MIL/uL   Hemoglobin 11.9 (L) 12.0 - 15.0 g/dL   HCT 78.2 95.6 - 21.3 %   MCV 87.0 78.0 - 100.0 fL   MCH 28.6 26.0 - 34.0 pg   MCHC 32.9 30.0 - 36.0 g/dL   RDW 08.6 57.8 - 46.9 %   Platelets 210 150 - 400 K/uL   Neutrophils Relative % 68 %   Neutro Abs 6.9 1.7 - 7.7 K/uL   Lymphocytes Relative 23 %  Lymphs Abs 2.4 0.7 - 4.0 K/uL   Monocytes Relative 9 %   Monocytes Absolute 0.9 0.1 - 1.0 K/uL   Eosinophils Relative 0 %   Eosinophils Absolute 0.0 0.0 - 0.7 K/uL   Basophils Relative 0 %   Basophils Absolute 0.0 0.0 - 0.1 K/uL  Glucose, capillary  Result Value Ref Range   Glucose-Capillary 175 (H) 65 - 99 mg/dL  Glucose, capillary  Result Value Ref Range   Glucose-Capillary 162 (H) 65 - 99 mg/dL  Hemoglobin and hematocrit, blood  Result Value Ref Range   Hemoglobin 12.8 12.0 - 15.0 g/dL   HCT 44.0 10.2 - 72.5 %  Glucose, capillary  Result Value Ref Range   Glucose-Capillary 150 (H) 65 - 99 mg/dL  Glucose, capillary  Result Value Ref Range   Glucose-Capillary 188 (H) 65 - 99 mg/dL  Glucose, capillary  Result Value Ref Range   Glucose-Capillary 171 (H) 65 - 99 mg/dL  Glucose, capillary  Result Value Ref Range   Glucose-Capillary 186 (H) 65 - 99 mg/dL  Glucose, capillary  Result Value Ref Range   Glucose-Capillary 212 (H) 65 - 99 mg/dL  CBC with Differential   Result Value Ref Range   WBC 9.1 4.0 - 10.5 K/uL   RBC 4.09 3.87 - 5.11 MIL/uL   Hemoglobin 11.8 (L) 12.0 - 15.0 g/dL   HCT 36.6 (L) 44.0 - 34.7 %   MCV 86.6 78.0 - 100.0 fL   MCH 28.9 26.0 - 34.0 pg   MCHC 33.3 30.0 - 36.0 g/dL   RDW 42.5 95.6 - 38.7 %   Platelets 191 150 - 400 K/uL   Neutrophils Relative % 63 %   Neutro Abs 5.7 1.7 - 7.7 K/uL   Lymphocytes Relative 27 %   Lymphs Abs 2.5 0.7 - 4.0 K/uL   Monocytes Relative 9 %   Monocytes Absolute 0.8 0.1 - 1.0 K/uL   Eosinophils Relative 1 %   Eosinophils Absolute 0.1 0.0 - 0.7 K/uL   Basophils Relative 0 %   Basophils Absolute 0.0 0.0 - 0.1 K/uL  Glucose, capillary  Result Value Ref Range   Glucose-Capillary 170 (H) 65 - 99 mg/dL  Glucose, capillary  Result Value Ref Range   Glucose-Capillary 182 (H) 65 - 99 mg/dL  Glucose, capillary  Result Value Ref Range   Glucose-Capillary 185 (H) 65 - 99 mg/dL    Dg Bone Density  Result Date: 12/15/2015 EXAM: DUAL X-RAY ABSORPTIOMETRY (DXA) FOR BONE MINERAL DENSITY IMPRESSION: Referring Physician:  Fleet Contras PATIENT: Name: Kmarie, Paskett Patient ID:  [id number] Birth Date: November 24, 1960 Height:     66.0 in. Sex:         Female Measured:   12/15/2015 Weight:     268.0 lbs. Indications: Estrogen Deficient, Lryica, Postmenopausal, Protonix Fractures:  None Treatments: None ASSESSMENT: The BMD measured at Femur Neck Right is 0.913 g/cm2 with a T-score of -0.9. This patient is considered normal according to World Health Organization [facility]) criteria. Site Region Measured Date Measured Age YA BMD Significant CHANGE T-score DualFemur Neck Right 12/15/2015    55.1         -0.9    0.913 g/cm2 AP Spine  L1-L4      10/15/2015    [age]         2.1     1.458 g/cm2 World Health Organization [facility]) criteria for post-menopausal, Caucasian Women: Normal       T-score at or above -1 SD Osteopenia   T-score  between -1 and -2.5 SD Osteoporosis T-score at or below -2.5 SD RECOMMENDATION: National Osteoporosis  Foundation recommends that FDA-approved medical therapies be considered in postmenopausal women and men age 64 or older with a: 1. Hip or vertebral (clinical or morphometric) fracture. 2. T-score of <-2.5 at the spine or hip. 3. Ten-year fracture probability by FRAX of 3% or greater for hip fracture or 20% or greater for major osteoporotic fracture. All treatment decisions require clinical judgment and consideration of individual patient factors, including patient preferences, co-morbidities, previous drug use, risk factors not captured in the FRAX model (e.g. falls, vitamin D deficiency, increased bone turnover, interval significant decline in bone density) and possible under - or over-estimation of fracture risk by FRAX. All patients should ensure an adequate intake of dietary calcium (1200 mg/d) and vitamin D (800 IU daily) unless contraindicated. FOLLOW-UP: People with diagnosed cases of osteoporosis or at high risk for fracture should have regular bone mineral density tests. For patients eligible for Medicare, routine testing is allowed once every 2 years. The testing frequency can be increased to one year for patients who have rapidly progressing disease, those who are receiving or discontinuing medical therapy to restore bone mass, or have additional risk factors. I have reviewed this report, and agree with the above findings. [facility] Electronically Signed   By: Annia Belt M.D.   On: 12/15/2015 14:49    Discharge Exam:  Vitals:   01/05/16 0143 01/05/16 0535  BP: (!) 147/69 (!) 150/77  Pulse: 98 97  Resp: 20 20  Temp: 98 F (36.7 C) 98.5 F (36.9 C)    General: Obese AA F who is alert and generally healthy appearing.  Lungs: Clear to auscultation and symmetric breath sounds. Heart:  RRR. No murmur or rub. Abdomen: Soft.  No hernia. Normal bowel sounds.  Her incisions look good.  Discharge Medications:     Medication List    TAKE these medications   allopurinol 300 MG  tablet Commonly known as:  ZYLOPRIM Take 1 tablet (300 mg total) by mouth daily.   amLODipine 10 MG tablet Commonly known as:  NORVASC Take 10 mg by mouth daily.   benazepril 40 MG tablet Commonly known as:  LOTENSIN Take 40 mg by mouth daily.   diphenhydrAMINE 25 MG tablet Commonly known as:  BENADRYL Take 25 mg by mouth every 6 (six) hours as needed for allergies.   furosemide 40 MG tablet Commonly known as:  LASIX Take 40 mg by mouth daily.   glimepiride 4 MG tablet Commonly known as:  AMARYL Take 4 mg by mouth daily.   LYRICA 200 MG capsule Generic drug:  pregabalin Take 200 mg by mouth 3 (three) times daily.   metFORMIN 500 MG tablet Commonly known as:  GLUCOPHAGE Take 500 mg by mouth 2 (two) times daily.   metoprolol succinate 25 MG 24 hr tablet Commonly known as:  TOPROL-XL Take 100 mg by mouth daily.   oxyCODONE-acetaminophen 5-325 MG tablet Commonly known as:  PERCOCET/ROXICET Take 1 tablet by mouth every 8 (eight) hours as needed for moderate pain.   pantoprazole 40 MG tablet Commonly known as:  PROTONIX Take 40 mg by mouth daily as needed (indigestion).   TRULICITY 1.5 MG/0.5ML Sopn Generic drug:  Dulaglutide Inject 1.5 mg into the skin once a week. Wednesdays   XULTOPHY 100-3.6 UNIT-MG/ML Sopn Generic drug:  Insulin Degludec-Liraglutide Inject 30 Int'l Units/L into the skin once.   zolpidem 10 MG tablet Commonly known as:  AMBIEN  Take 10 mg by mouth at bedtime as needed for sleep.       Disposition: 01-Home or Self Care  Discharge Instructions    Ambulate hourly while awake    Complete by:  As directed    Call MD for:  difficulty breathing, headache or visual disturbances    Complete by:  As directed    Call MD for:  persistant dizziness or light-headedness    Complete by:  As directed    Call MD for:  persistant nausea and vomiting    Complete by:  As directed    Call MD for:  redness, tenderness, or signs of infection (pain,  swelling, redness, odor or green/yellow discharge around incision site)    Complete by:  As directed    Call MD for:  severe uncontrolled pain    Complete by:  As directed    Call MD for:  temperature >101 F    Complete by:  As directed    Diet bariatric full liquid    Complete by:  As directed    Incentive spirometry    Complete by:  As directed    Perform hourly while awake     Activity:  Driving - May drive in 4 or 5 days, if doing well   Lifting - No lifting more than 15 pounds for 10 days.  Wound Care:   May shower  Diet:  Post sleeve diet  Follow up appointment:  Call Dr. Allene Pyo office [facility]) at [phone] for an appointment in 2 weeks.        To see Dr. Woodward Ku within 2 weeks.  Medications and dosages:  Resume your home medications.             Follow blood sugars closely.  Signed: Ovidio Kin, M.D., [facility] Office:  [phone]  01/05/2016, 7:27 AM

## 2016-01-05 NOTE — Progress Notes (Signed)
Patient alert and oriented, pain is controlled. Patient is tolerating fluids, advanced to protein shake today, patient is tolerating well.  Reviewed Gastric sleeve discharge instructions with patient and patient is able to articulate understanding.  Provided information on BELT program, Support Group and [facility] outpatient pharmacy. All questions answered, will continue to monitor.  

## 2016-01-17 ENCOUNTER — Encounter: Payer: Medicare Other | Attending: Surgery | Admitting: Dietician

## 2016-01-17 DIAGNOSIS — E119 Type 2 diabetes mellitus without complications: Secondary | ICD-10-CM | POA: Insufficient documentation

## 2016-01-17 DIAGNOSIS — Z713 Dietary counseling and surveillance: Secondary | ICD-10-CM | POA: Insufficient documentation

## 2016-01-18 NOTE — Progress Notes (Signed)
Bariatric Class:  Appt start time: 1530 end time:  1630.  2 Week Post-Operative Nutrition Class  Patient was seen on 01/18/2016 for [facility]ative [facility]   Surgery date: 01/03/2016 Surgery type: Sleeve gastrectomy Start weight at [facility]: 250 lbs on 11/10/2014, 268 lbs on 12/12/2015 Weight today: 247.4 lbs  Weight change: 20.6 lbs  TANITA  BODY COMP RESULTS  12/12/15 01/18/16   BMI (kg/m^2) 43.3 39.9   Fat Mass (lbs) 136.4 129.2   Fat Free Mass (lbs) 131.6 118.2   Total Body Water (lbs) 96.6 86.4   The following the learning objectives were met by the patient during this course:  Identifies Phase 3A (Soft, High Proteins) Dietary Goals and will begin from 2 weeks post-operatively to 2 months post-operatively  Identifies appropriate sources of fluids and proteins   States protein recommendations and appropriate sources post-operatively  Identifies the need for appropriate texture modifications, mastication, and bite sizes when consuming solids  Identifies appropriate multivitamin and calcium sources post-operatively  Describes the need for physical activity post-operatively and will follow MD recommendations  States when to call healthcare provider regarding medication questions or post-operative complications  Handouts given during class include:  Phase 3A: Soft, High Protein Diet Handout  Follow-Up Plan: Patient will follow-up at [facility] in 6 weeks for 2 month post-op nutrition visit for diet advancement per MD.    

## 2016-01-27 ENCOUNTER — Ambulatory Visit (INDEPENDENT_AMBULATORY_CARE_PROVIDER_SITE_OTHER): Payer: Medicare Other | Admitting: Podiatry

## 2016-01-27 ENCOUNTER — Encounter: Payer: Self-pay | Admitting: Podiatry

## 2016-01-27 VITALS — Ht 66.0 in

## 2016-01-27 DIAGNOSIS — M79676 Pain in unspecified toe(s): Secondary | ICD-10-CM

## 2016-01-27 DIAGNOSIS — B351 Tinea unguium: Secondary | ICD-10-CM

## 2016-01-27 DIAGNOSIS — E1140 Type 2 diabetes mellitus with diabetic neuropathy, unspecified: Secondary | ICD-10-CM

## 2016-01-27 NOTE — Progress Notes (Signed)
Patient ID: Lacey Meyer, female   DOB: 11/30/1960, 55 y.o.   MRN: [id number] Complaint:  Visit Type: Patient returns to my office for continued preventative foot care services. Complaint: Patient states" my nails have grown long and thick and become painful to walk and wear shoes" Patient has been diagnosed with DM with no foot complications. The patient presents for preventative foot care services. No changes to ROS.  Patient says she has had barometric surgery and has lost 24 pounds in three weeks.  Podiatric Exam: Vascular: dorsalis pedis and posterior tibial pulses are palpable bilateral. Capillary return is immediate. Temperature gradient is WNL. Skin turgor WNL  Sensorium: Diminished  Semmes Weinstein monofilament test. Normal tactile sensation bilaterally. Nail Exam: Pt has thick disfigured discolored nails with subungual debris noted bilateral entire nail hallux through fifth toenails Ulcer Exam: There is no evidence of ulcer or pre-ulcerative changes or infection. Orthopedic Exam: Muscle tone and strength are WNL. No limitations in general ROM. No crepitus or effusions noted. Foot type and digits show no abnormalities. Bony prominences are unremarkable. Pain and swelling left foot especially at the level 1st MPJ left foot. Skin: No Porokeratosis. No infection or ulcers  Diagnosis:  Onychomycosis, , Pain in right toe, pain in left toes,  Post- op swelling left foot.  Treatment & Plan Procedures and Treatment: Consent by patient was obtained for treatment procedures. The patient understood the discussion of treatment and procedures well. All questions were answered thoroughly reviewed. Debridement of mycotic and hypertrophic toenails, 1 through 5 bilateral and clearing of subungual debris. No ulceration, no infection noted. Xrays taken reveal healing at the surgical sites and the implants seem to be intact. Told her to wear her anklet and return to cam walker to help decrease her left foot  swelling. Return Visit-Office Procedure: Patient instructed to return to the office for a follow up visit 3 months for continued evaluation and treatment.     Helane GuntherGregory [name] DPM

## 2016-02-27 ENCOUNTER — Encounter: Payer: Medicare Other | Attending: Surgery | Admitting: Dietician

## 2016-02-27 DIAGNOSIS — E119 Type 2 diabetes mellitus without complications: Secondary | ICD-10-CM | POA: Diagnosis not present

## 2016-02-27 DIAGNOSIS — Z794 Long term (current) use of insulin: Secondary | ICD-10-CM

## 2016-02-27 DIAGNOSIS — Z713 Dietary counseling and surveillance: Secondary | ICD-10-CM | POA: Diagnosis not present

## 2016-02-27 DIAGNOSIS — E118 Type 2 diabetes mellitus with unspecified complications: Secondary | ICD-10-CM

## 2016-02-27 NOTE — Patient Instructions (Addendum)
Goals:  Follow Phase 3B: High Protein + Non-Starchy Vegetables  Eat 3-6 small meals/snacks, every 3-5 hrs  Increase lean protein foods to meet 60g goal  Keep working on eating protein foods throughout the day  Eat protein before veggies  Increase fluid intake to 64oz +  Only drink apple juice when you absolutely have to!  Avoid drinking 15 minutes before, during and 30 minutes after eating  [facility]  Talk to your doctor about low blood sugars and medications  Surgery date: 01/03/2016 Surgery type: Sleeve gastrectomy [facility]: 250 lbs on 11/10/2014, 268 lbs on 12/12/2015 Weight today: 244.8 lbs Weight change: 2.4 lbs Total weight lost: 23.2 lbs  TANITA  BODY COMP RESULTS  12/12/15 01/18/16 02/27/16   BMI (kg/m^2) 43.3 39.9 39.5   Fat Mass (lbs) 136.4 129.2 119.8   Fat Free Mass (lbs) 131.6 118.2 125   Total Body Water (lbs) 96.6 86.4 91.2    Nonscale victories: -Loose clothes -No insulin and good blood sugars! -Less blood pressure medicine -Less swelling -Breathing easier -Getting down on floor and getting back up

## 2016-02-27 NOTE — Progress Notes (Signed)
  Follow-up visit:  8 Weeks Post-Operative Sleeve gastrectomy Surgery  Medical Nutrition Therapy:  Appt start time: 240 end time:  320  Primary concerns today: Post-operative Bariatric Surgery Nutrition Management. Lacey Meyer returns with her husband having lost a total of 23 pounds. She is tearful during the appointment today and expresses frustration with slow weight loss and negative feedback from family members. States that her doctor does not want her to do more than 10 minutes of exercise at a time due to neuropathy. Pulled a muscle in her leg. Feeling depressed because she is not able to exercise. Excited that she is no longer on insulin. Reports she is more active overall. Still feeling a lot of restriction in her pouch and unable to eat more than an ounce of meat at a time. Dentures are feeling loose. Drinking apple juice for low blood sugar, drops as low as 70 mg/dL.   Surgery date: 01/03/2016 Surgery type: Sleeve gastrectomy [facility] 250 lbs on 11/10/2014, 268 lbs on 12/12/2015 Weight today: 244.8 lbs Weight change: 2.4 lbs Total weight lost: 23.2 lbs  TANITA  BODY COMP RESULTS  12/12/15 01/18/16 02/27/16   BMI (kg/m^2) 43.3 39.9 39.5   Fat Mass (lbs) 136.4 129.2 119.8   Fat Free Mass (lbs) 131.6 118.2 125   Total Body Water (lbs) 96.6 86.4 91.2    Preferred Learning Style:   No preference indicated   Learning Readiness:   Ready  24-hr recall: B (AM): 1 egg with cheese (7g)  Snk (AM): Premier protein shake throughout the day (30g) L (11-12PM): deli meat and cheese (14g) D (3-4PM): chili beans or about half an oz pork/chicken/steak (7g) Snk (PM):   Fluid intake: water, protein shake, apple juice for low blood sugars (patient unsure of total amount of fluid) Estimated total protein intake: ~60 grams  Medications: see list Supplementation: taking   CBG monitoring: several times throughout the day Average CBG per patient: 70-130 mg/dL Last patient reported  A1c: n/a  Drinking while eating: no Hair loss: did not assess Carbonated beverages: none N/V/D/C: vomiting occasionally when she eats too much; regular bowel movements  Dumping syndrome: none  Recent physical activity:  Minimal organized activity  Progress Towards Goal(s):  In progress.  Handouts given during visit include:  Phase 3B lean protein + non starchy vegetables   Nutritional Diagnosis:  [place]-3.3 Overweight/obesity related to past poor dietary habits and physical inactivity as evidenced by patient w/ recent sleeve gastrectomy surgery following dietary guidelines for continued weight loss.     Intervention:  Nutrition counseling provided.  Teaching Method Utilized:  Visual Auditory Hands on  Barriers to learning/adherence to lifestyle change: none  Demonstrated degree of understanding via:  Teach Back   Monitoring/Evaluation:  Dietary intake, exercise, and body weight. Follow up in 1 months for 3 month post-op visit.

## 2016-02-28 ENCOUNTER — Encounter: Payer: Self-pay | Admitting: Dietician

## 2016-02-28 ENCOUNTER — Ambulatory Visit: Payer: Self-pay | Admitting: Dietician

## 2016-03-18 ENCOUNTER — Emergency Department (HOSPITAL_COMMUNITY): Payer: Medicare Other

## 2016-03-18 ENCOUNTER — Encounter (HOSPITAL_COMMUNITY): Payer: Self-pay | Admitting: Emergency Medicine

## 2016-03-18 ENCOUNTER — Observation Stay (HOSPITAL_COMMUNITY)
Admission: EM | Admit: 2016-03-18 | Discharge: 2016-03-19 | Disposition: A | Discharge status: Home / Self Care | Payer: Medicare Other | Attending: Internal Medicine | Admitting: Internal Medicine

## 2016-03-18 DIAGNOSIS — M109 Gout, unspecified: Secondary | ICD-10-CM | POA: Insufficient documentation

## 2016-03-18 DIAGNOSIS — Z794 Long term (current) use of insulin: Secondary | ICD-10-CM | POA: Insufficient documentation

## 2016-03-18 DIAGNOSIS — Z87892 Personal history of anaphylaxis: Secondary | ICD-10-CM | POA: Insufficient documentation

## 2016-03-18 DIAGNOSIS — I119 Hypertensive heart disease without heart failure: Secondary | ICD-10-CM | POA: Insufficient documentation

## 2016-03-18 DIAGNOSIS — Z9889 Other specified postprocedural states: Secondary | ICD-10-CM | POA: Insufficient documentation

## 2016-03-18 DIAGNOSIS — Z880 Allergy status to penicillin: Secondary | ICD-10-CM | POA: Diagnosis not present

## 2016-03-18 DIAGNOSIS — I517 Cardiomegaly: Secondary | ICD-10-CM | POA: Insufficient documentation

## 2016-03-18 DIAGNOSIS — R200 Anesthesia of skin: Secondary | ICD-10-CM | POA: Diagnosis not present

## 2016-03-18 DIAGNOSIS — K219 Gastro-esophageal reflux disease without esophagitis: Secondary | ICD-10-CM | POA: Insufficient documentation

## 2016-03-18 DIAGNOSIS — Z91048 Other nonmedicinal substance allergy status: Secondary | ICD-10-CM | POA: Insufficient documentation

## 2016-03-18 DIAGNOSIS — E1169 Type 2 diabetes mellitus with other specified complication: Secondary | ICD-10-CM | POA: Diagnosis present

## 2016-03-18 DIAGNOSIS — R4789 Other speech disturbances: Secondary | ICD-10-CM | POA: Diagnosis not present

## 2016-03-18 DIAGNOSIS — Z6839 Body mass index (BMI) 39.0-39.9, adult: Secondary | ICD-10-CM | POA: Insufficient documentation

## 2016-03-18 DIAGNOSIS — G9340 Encephalopathy, unspecified: Secondary | ICD-10-CM | POA: Diagnosis not present

## 2016-03-18 DIAGNOSIS — M1990 Unspecified osteoarthritis, unspecified site: Secondary | ICD-10-CM | POA: Insufficient documentation

## 2016-03-18 DIAGNOSIS — Z9851 Tubal ligation status: Secondary | ICD-10-CM | POA: Insufficient documentation

## 2016-03-18 DIAGNOSIS — Z9841 Cataract extraction status, right eye: Secondary | ICD-10-CM | POA: Insufficient documentation

## 2016-03-18 DIAGNOSIS — Z87891 Personal history of nicotine dependence: Secondary | ICD-10-CM | POA: Insufficient documentation

## 2016-03-18 DIAGNOSIS — E669 Obesity, unspecified: Secondary | ICD-10-CM

## 2016-03-18 DIAGNOSIS — R4182 Altered mental status, unspecified: Secondary | ICD-10-CM | POA: Insufficient documentation

## 2016-03-18 DIAGNOSIS — Z8249 Family history of ischemic heart disease and other diseases of the circulatory system: Secondary | ICD-10-CM | POA: Diagnosis not present

## 2016-03-18 DIAGNOSIS — I700 Atherosclerosis of aorta: Secondary | ICD-10-CM | POA: Insufficient documentation

## 2016-03-18 DIAGNOSIS — E119 Type 2 diabetes mellitus without complications: Secondary | ICD-10-CM | POA: Diagnosis not present

## 2016-03-18 DIAGNOSIS — I639 Cerebral infarction, unspecified: Secondary | ICD-10-CM | POA: Diagnosis present

## 2016-03-18 DIAGNOSIS — I10 Essential (primary) hypertension: Secondary | ICD-10-CM | POA: Diagnosis present

## 2016-03-18 LAB — URINALYSIS, ROUTINE W REFLEX MICROSCOPIC
Bilirubin Urine: NEGATIVE
GLUCOSE, UA: NEGATIVE mg/dL
Hgb urine dipstick: NEGATIVE
Ketones, ur: NEGATIVE mg/dL
LEUKOCYTES UA: NEGATIVE
Nitrite: POSITIVE — AB
PH: 5 (ref 5.0–8.0)
Protein, ur: NEGATIVE mg/dL
Specific Gravity, Urine: 1.02 (ref 1.005–1.030)

## 2016-03-18 LAB — I-STAT CHEM 8, ED
BUN: 9 mg/dL (ref 6–20)
CALCIUM ION: 1.1 mmol/L — AB (ref 1.15–1.40)
CHLORIDE: 107 mmol/L (ref 101–111)
Creatinine, Ser: 0.8 mg/dL (ref 0.44–1.00)
Glucose, Bld: 119 mg/dL — ABNORMAL HIGH (ref 65–99)
HEMATOCRIT: 38 % (ref 36.0–46.0)
Hemoglobin: 12.9 g/dL (ref 12.0–15.0)
Potassium: 3.4 mmol/L — ABNORMAL LOW (ref 3.5–5.1)
SODIUM: 143 mmol/L (ref 135–145)
TCO2: 23 mmol/L (ref 0–100)

## 2016-03-18 LAB — DIFFERENTIAL
Basophils Absolute: 0 10*3/uL (ref 0.0–0.1)
Basophils Relative: 1 %
Eosinophils Absolute: 0.3 10*3/uL (ref 0.0–0.7)
Eosinophils Relative: 4 %
LYMPHS ABS: 3 10*3/uL (ref 0.7–4.0)
LYMPHS PCT: 44 %
Monocytes Absolute: 0.4 10*3/uL (ref 0.1–1.0)
Monocytes Relative: 5 %
NEUTROS PCT: 46 %
Neutro Abs: 3.1 10*3/uL (ref 1.7–7.7)

## 2016-03-18 LAB — COMPREHENSIVE METABOLIC PANEL
ALBUMIN: 3.8 g/dL (ref 3.5–5.0)
ALK PHOS: 58 U/L (ref 38–126)
ALT: 26 U/L (ref 14–54)
ANION GAP: 12 (ref 5–15)
AST: 28 U/L (ref 15–41)
BILIRUBIN TOTAL: 0.3 mg/dL (ref 0.3–1.2)
BUN: 9 mg/dL (ref 6–20)
CALCIUM: 9.3 mg/dL (ref 8.9–10.3)
CO2: 22 mmol/L (ref 22–32)
Chloride: 107 mmol/L (ref 101–111)
Creatinine, Ser: 0.84 mg/dL (ref 0.44–1.00)
GFR calc Af Amer: >60 mL/min (ref >60)
GFR calc non Af Amer: >60 mL/min (ref >60)
GLUCOSE: 118 mg/dL — AB (ref 65–99)
Potassium: 3.5 mmol/L (ref 3.5–5.1)
SODIUM: 141 mmol/L (ref 135–145)
TOTAL PROTEIN: 7.6 g/dL (ref 6.5–8.1)

## 2016-03-18 LAB — I-STAT TROPONIN, ED
Troponin i, poc: 0 ng/mL (ref 0.00–0.08)
Troponin i, poc: 0 ng/mL (ref 0.00–0.08)

## 2016-03-18 LAB — CBG MONITORING, ED: GLUCOSE-CAPILLARY: 119 mg/dL — AB (ref 65–99)

## 2016-03-18 LAB — APTT: aPTT: 33 seconds (ref 24–36)

## 2016-03-18 LAB — CBC
HCT: 37 % (ref 36.0–46.0)
HEMOGLOBIN: 12.1 g/dL (ref 12.0–15.0)
MCH: 27.9 pg (ref 26.0–34.0)
MCHC: 32.7 g/dL (ref 30.0–36.0)
MCV: 85.3 fL (ref 78.0–100.0)
PLATELETS: 239 10*3/uL (ref 150–400)
RBC: 4.34 MIL/uL (ref 3.87–5.11)
RDW: 15.6 % — ABNORMAL HIGH (ref 11.5–15.5)
WBC: 6.8 10*3/uL (ref 4.0–10.5)

## 2016-03-18 LAB — I-STAT CG4 LACTIC ACID, ED: LACTIC ACID, VENOUS: 3.12 mmol/L — AB (ref 0.5–1.9)

## 2016-03-18 LAB — PROTIME-INR
INR: 1.15
PROTHROMBIN TIME: 14.8 s (ref 11.4–15.2)

## 2016-03-18 MED ORDER — SODIUM CHLORIDE 0.9 % IV BOLUS (SEPSIS)
1000.0000 mL | Freq: Once | INTRAVENOUS | Status: AC
  Administered 2016-03-18: 1000 mL via INTRAVENOUS

## 2016-03-18 MED ORDER — SODIUM CHLORIDE 0.9 % IV SOLN
INTRAVENOUS | Status: AC
  Administered 2016-03-18: 23:00:00 via INTRAVENOUS

## 2016-03-18 NOTE — Consult Note (Signed)
Referring Physician: Dr. Denton LankSteinl    Chief Complaint: Acute onset of speech arrest and bowel incontinence  HPI: Lacey Meyer is an 56 y.o. female who presents via EMS as a Code Stroke after sudden onset of speech arrest with eyes-open unresponsive state. She was at home with family speaking when she suddenly slumped over partially. The family asked what was wrong and she stated "I'm not going anywhere" laughed briefly, then became completely nonverbal. She then lost control of her bowels. Family attempted to get her to the bathroom, requiring 5 family members to "drag her there". EMS was called and on arrival she remained nonverbal, but awake. No definite lateralized weakness or facial droop per EMS. Symptom onset was 7:00 PM.  During the Neurological examination, she recovers some verbal ability, speaking slowly and hesitantly in a whisper. She is able to relate that she has a right temporal headache, but no neck or abdominal limb pain. She has "a little" chest pain and mild LLE pain. She denies prior history of stroke or MI. She had a sleeve gastrectomy in the Fall of 2017 and as of most recent follow up note, she has lost over 20 lbs but is frustrated by the "slow rate" of weight loss. Her PMHx includes HTN, DM, arthritis and gout. She denies being on ASA or a blood thinner.   LSN: 7:00 PM tPA Given: No: Due to nonlateralizing exam with multiple inconsistencies suggestive of etiologies other than stroke, as outlined in the assessment/plan below. MRI negative for restricted diffusion. MRA negative for LVO.   Past Medical History:  Diagnosis Date  . Anemia   . Arthritis   . Diabetes mellitus without complication (HCC)   . GERD (gastroesophageal reflux disease)   . Gout   . History of blood transfusion   . HTN (hypertension)   . Impaired vision     Past Surgical History:  Procedure Laterality Date  . BTL    . EYE SURGERY     cataract surgery / right eye   . FOOT SURGERY     bilateral    . LAPAROSCOPIC GASTRIC SLEEVE RESECTION N/A 01/03/2016   Procedure: LAPAROSCOPIC GASTRIC SLEEVE RESECTION WITH UPPER ENDO;  Surgeon: Ovidio Kinavid Newman, MD;  Location: [facility];  Service: General;  Laterality: N/A;  . SHOULDER ARTHROSCOPY W/ ROTATOR CUFF REPAIR     left   . TUBAL LIGATION      Family History  Problem Relation Age of Onset  . Hypertension Mother   . Hypertension Father    Social History:  reports that she quit smoking about 6 years ago. Her smoking use included Cigarettes. She has a 0.25 pack-year smoking history. She has never used smokeless tobacco. She reports that she does not drink alcohol or use drugs.  Allergies:  Allergies  Allergen Reactions  . Ampicillin Anaphylaxis and Hives    Has patient had a PCN reaction causing immediate rash, facial/tongue/throat swelling, SOB or lightheadedness with hypotension: Yes Has patient had a PCN reaction causing severe rash involving mucus membranes or skin necrosis: Yes Has patient had a PCN reaction that required hospitalization Yes Has patient had a PCN reaction occurring within the last 10 years: No If all of the above answers are "NO", then may proceed with Cephalosporin use.   Marland Kitchen. Doxycycline     unknown  . Other Swelling and Rash    Blistex    Medications: Prior to Admission: Unable to relate her complete list of meds during acute evaluation. States she  is not taking a blood thinner or aspirin.   ROS: As per HPI.  General Examination: Weight 112.3 kg (247 lb 9.2 oz).  [place]/AT Morbidly obese Limbs warm and well-perfused  Neurologic Examination: Ment: Awake. Whispered fragmentary responses to questions. Able to follow commands. Difficulty with naming accompanied by strained, tearful and anxious affect when attention is focused on this part of the exam. Briefly at other times her whispered speech is fluent without word-finding deficit.  CN: PERRL. Visual fields intact. EOMI. Subjective decreased sensation to left side of  face. Face symmetric. X - severe hypophonia with labored, dramatic affect during speech, suggestive of embellishment. XI - symmetric. Wiggles tongue to left and right when asked to protrude.  Motor: Elevates both arms and legs antigravity, requiring repeated coaching. Resists examiner at 4/5 bilateral upper and lower extremities, with inconsistent giveway weakness.  Sensory: Endorses decreased temperature and FT sensation to left arm. Endorses decreased sensation distal legs bilaterally.  Reflexes: 2+ bilateral biceps, brachioradialis, patellae and achilles without asymmetry. Toes downgoing bilaterally.  Cerebellar: Slow FNF without ataxia bilaterally. Exhibits a strained, exertional affect during this portion of the examination.   Gait: Deferred in the context of acute evaluation.   Results for orders placed or performed during the hospital encounter of 03/18/16 (from the past 48 hour(s))  CBG monitoring, ED     Status: Abnormal   Collection Time: 03/18/16  8:38 PM  Result Value Ref Range   Glucose-Capillary 119 (H) 65 - 99 mg/dL   Comment 1 Notify RN    Comment 2 Call MD NNP PA CNM    Comment 3 Document in Chart   Protime-INR     Status: None   Collection Time: 03/18/16  8:39 PM  Result Value Ref Range   Prothrombin Time 14.8 11.4 - 15.2 seconds   INR 1.15   APTT     Status: None   Collection Time: 03/18/16  8:39 PM  Result Value Ref Range   aPTT 33 24 - 36 seconds  CBC     Status: Abnormal   Collection Time: 03/18/16  8:39 PM  Result Value Ref Range   WBC 6.8 4.0 - 10.5 K/uL   RBC 4.34 3.87 - 5.11 MIL/uL   Hemoglobin 12.1 12.0 - 15.0 g/dL   HCT 16.1 09.6 - 04.5 %   MCV 85.3 78.0 - 100.0 fL   MCH 27.9 26.0 - 34.0 pg   MCHC 32.7 30.0 - 36.0 g/dL   RDW 40.9 (H) 81.1 - 91.4 %   Platelets 239 150 - 400 K/uL  Differential     Status: None   Collection Time: 03/18/16  8:39 PM  Result Value Ref Range   Neutrophils Relative % 46 %   Neutro Abs 3.1 1.7 - 7.7 K/uL   Lymphocytes  Relative 44 %   Lymphs Abs 3.0 0.7 - 4.0 K/uL   Monocytes Relative 5 %   Monocytes Absolute 0.4 0.1 - 1.0 K/uL   Eosinophils Relative 4 %   Eosinophils Absolute 0.3 0.0 - 0.7 K/uL   Basophils Relative 1 %   Basophils Absolute 0.0 0.0 - 0.1 K/uL  I-stat troponin, ED     Status: None   Collection Time: 03/18/16  8:44 PM  Result Value Ref Range   Troponin i, poc 0.00 0.00 - 0.08 ng/mL   Comment 3            Comment: Due to the release kinetics of cTnI, a negative result within the first hours  of the onset of symptoms does not rule out myocardial infarction with certainty. If myocardial infarction is still suspected, repeat the test at appropriate intervals.   I-Stat Chem 8, ED     Status: Abnormal   Collection Time: 03/18/16  8:46 PM  Result Value Ref Range   Sodium 143 135 - 145 mmol/L   Potassium 3.4 (L) 3.5 - 5.1 mmol/L   Chloride 107 101 - 111 mmol/L   BUN 9 6 - 20 mg/dL   Creatinine, Ser 0.98 0.44 - 1.00 mg/dL   Glucose, Bld 119 (H) 65 - 99 mg/dL   Calcium, Ion 1.47 (L) 1.15 - 1.40 mmol/L   TCO2 23 0 - 100 mmol/L   Hemoglobin 12.9 12.0 - 15.0 g/dL   HCT 82.9 56.2 - 13.0 %  I-Stat CG4 Lactic Acid, ED     Status: Abnormal   Collection Time: 03/18/16  8:46 PM  Result Value Ref Range   Lactic Acid, Venous 3.12 (HH) 0.5 - 1.9 mmol/L   Comment NOTIFIED PHYSICIAN    Ct Head Code Stroke W/o Cm  Result Date: 03/18/2016 CLINICAL DATA:  Code stroke.  Aphasia.  Diabetes and hypertension EXAM: CT HEAD WITHOUT CONTRAST TECHNIQUE: Contiguous axial images were obtained from the base of the skull through the vertex without intravenous contrast. COMPARISON:  None. FINDINGS: Brain: No evidence of acute infarction, hemorrhage, hydrocephalus, extra-axial collection or mass lesion/mass effect. Vascular: No hyperdense vessel or unexpected calcification. Skull: Negative Sinuses/Orbits: Negative Other: None ASPECTS (Alberta Stroke Program Early CT Score) - Ganglionic level infarction (caudate,  lentiform nuclei, internal capsule, insula, M1-M3 cortex): 7 - Supraganglionic infarction (M4-M6 cortex): 3 Total score (0-10 with 10 being normal): 10 IMPRESSION: 1. Negative CT head 2. ASPECTS is 10 Electronically Signed   By: Marlan Palau M.D.   On: 03/18/2016 20:57    Assessment: 56 y.o. female with acute onset of speech arrest and bowel incontinence. No lateralizing findings on exam except for subjective sensory deficit on the left. Speech pattern atypical for a stroke syndrome.  1. CT head negative for acute changes. No chronic infarctions are seen.  2. Presentation appears most consistent with functionality (malingering, secondary gain, conversion disorder). However, cannot completely rule out stroke or vessel occlusion with exam alone. Obtaining STAT MRI/MRA of brain to evaluate for possible restricted diffusion or vessel occlusion.  3. Recent sleeve gastrectomy.  4. Morbid obesity, DM, HTN.   Plan: 1. STAT MRI/MRA of brain. 2. Start ASA 81 mg po qd.  3. Consider starting a statin, if not already taking.  4. PT consult, OT consult, Speech consult 5. Echocardiogram 6. Carotid dopplers 7. Permissive HTN x 24 hours.  8. Telemetry monitoring 9. Frequent neuro checks 10. Psychology consult for possible life/home stressors.   Addendum: MRI/MRA brain reviewed. No restricted diffusion or LVO seen.   @Electronically  signed: Dr. Caryl Pina  03/18/2016, 9:17 PM

## 2016-03-18 NOTE — ED Notes (Signed)
I Stat Lactic Acid results shown to Dr. Steinl 

## 2016-03-18 NOTE — ED Provider Notes (Addendum)
[facility]-EMERGENCY DEPT Provider Note   CSN: [id number] Arrival date & time: 03/18/16  2034     History   Chief Complaint No chief complaint on file.   HPI Lacey Meyer is a 56 y.o. female.  Patient with hx dm, prior gastric/weight loss surgery, presents with acute alteration in mental status.  Per ems report, pt had been out, returned home, suddenly laughed, and then became unresponsive verbally. Patient did not loss postural tone or pass out, but did require family assistance, and did have bm on self. No report of preceding illness or fevers. No report of trauma or fall. On arrival to ED patient is not verbally responsive to questions - level 5 caveat.    The history is provided by the patient and the EMS personnel. The history is limited by the condition of the patient.    Past Medical History:  Diagnosis Date  . Anemia   . Arthritis   . Diabetes mellitus without complication (HCC)   . GERD (gastroesophageal reflux disease)   . Gout   . History of blood transfusion   . HTN (hypertension)   . Impaired vision     Patient Active Problem List   Diagnosis Date Noted  . Morbid obesity (HCC) 01/03/2016  . 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 Pain in limb 12/23/2012  . Arthritis of foot 12/23/2012  . Obesity, Class II, BMI 35-39.9, with comorbidity 06/25/2011  . HTN (hypertension)   . Impaired vision   . Gout   . Anemia   . Nuclear sclerotic cataract 02/20/2011  . Pseudoaphakia 02/20/2011  . 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 Acute loss of vision 12/15/2010    Past Surgical History:  Procedure Laterality Date  . BTL    . EYE SURGERY     cataract surgery / right eye   . FOOT SURGERY     bilateral   . LAPAROSCOPIC GASTRIC SLEEVE RESECTION N/A 01/03/2016   Procedure: LAPAROSCOPIC GASTRIC SLEEVE RESECTION WITH UPPER ENDO;  Surgeon: Ovidio Kin, MD;  Location: [facility];  Service: General;  Laterality: N/A;  . SHOULDER  ARTHROSCOPY W/ ROTATOR CUFF REPAIR     left   . TUBAL LIGATION      OB History    No data available       Home Medications    Prior to Admission medications   Medication Sig Start Date End Date Taking? Authorizing Provider  allopurinol (ZYLOPRIM) 300 MG tablet Take 1 tablet (300 mg total) by mouth daily. 10/03/11   Elvina Sidle, MD  amLODipine (NORVASC) 10 MG tablet Take 10 mg by mouth daily.  03/07/15   Historical Provider, MD  benazepril (LOTENSIN) 40 MG tablet Take 40 mg by mouth daily.  03/11/15   Historical Provider, MD  diphenhydrAMINE (BENADRYL) 25 MG tablet Take 25 mg by mouth every 6 (six) hours as needed for allergies.    Historical Provider, MD  Dulaglutide (TRULICITY) 1.5 MG/0.5ML SOPN Inject 1.5 mg into the skin once a week. Wednesdays 12/28/15   Historical Provider, MD  furosemide (LASIX) 40 MG tablet Take 40 mg by mouth daily.  04/02/15   Historical Provider, MD  glimepiride (AMARYL) 4 MG tablet Take 4 mg by mouth daily. 10/16/15   Historical Provider, MD  Insulin Degludec-Liraglutide (XULTOPHY) 100-3.6 UNIT-MG/ML SOPN Inject 30 Int'l Units/L into the skin once.    Historical Provider, MD  LYRICA 200 MG capsule Take 200 mg by mouth 3 (three) times daily.  08/04/15   Historical Provider, MD  metFORMIN (GLUCOPHAGE) 500 MG tablet Take 500 mg by mouth 2 (two) times daily.  02/28/15   Historical Provider, MD  metoprolol succinate (TOPROL-XL) 25 MG 24 hr tablet Take 100 mg by mouth daily.  12/28/15   Historical Provider, MD  oxyCODONE-acetaminophen (PERCOCET/ROXICET) 5-325 MG tablet Take 1 tablet by mouth every 8 (eight) hours as needed for moderate pain.  04/06/15   Historical Provider, MD  pantoprazole (PROTONIX) 40 MG tablet Take 40 mg by mouth daily as needed (indigestion).  04/24/15   Historical Provider, MD  zolpidem (AMBIEN) 10 MG tablet Take 10 mg by mouth at bedtime as needed for sleep.  03/18/15   Historical Provider, MD    Family History Family History  Problem Relation  Age of Onset  . Hypertension Mother   . Hypertension Father     Social History Social History  Substance Use Topics  . Smoking status: Former Smoker    Packs/day: 0.25    Years: 1.00    Types: Cigarettes    Quit date: 03/06/2010  . Smokeless tobacco: Never Used  . Alcohol use No     Allergies   Ampicillin; Doxycycline; and Other   Review of Systems Review of Systems  Unable to perform ROS: Patient nonverbal  level 5 caveat, pt not verbally responsive to questions.    Physical Exam Updated Vital Signs Wt 112.3 kg   BMI 39.96 kg/m   Physical Exam  Constitutional: She appears well-developed and well-nourished. No distress.  HENT:  Head: Atraumatic.  Mouth/Throat: Oropharynx is clear and moist.  Eyes: Conjunctivae are normal. Pupils are equal, round, and reactive to light. No scleral icterus.  Neck: Neck supple. No tracheal deviation present.  No stiffness or rigidity. No bruits  Cardiovascular: Normal rate, regular rhythm, normal heart sounds and intact distal pulses.   No murmur heard. Pulmonary/Chest: Effort normal and breath sounds normal. No respiratory distress.  Abdominal: Soft. Normal appearance. She exhibits no distension. There is no tenderness.  Genitourinary:  Genitourinary Comments: No cva tenderness  Musculoskeletal: She exhibits no edema.  TLS spine non tender, right lumbar muscular tenderness reproducing patients symptoms.   Neurological: She is alert.  Awake and alert appearing. Does not attempt to verbalize/speak.   Skin: Skin is warm and dry. No rash noted. She is not diaphoretic.  Psychiatric:  Alert, content appearing.   Nursing note and vitals reviewed.    ED Treatments / Results  Labs (all labs ordered are listed, but only abnormal results are displayed) Results for orders placed or performed during the hospital encounter of 03/18/16  Protime-INR  Result Value Ref Range   Prothrombin Time 14.8 11.4 - 15.2 seconds   INR 1.15   APTT    Result Value Ref Range   aPTT 33 24 - 36 seconds  CBC  Result Value Ref Range   WBC 6.8 4.0 - 10.5 K/uL   RBC 4.34 3.87 - 5.11 MIL/uL   Hemoglobin 12.1 12.0 - 15.0 g/dL   HCT 86.537.0 78.436.0 - 69.646.0 %   MCV 85.3 78.0 - 100.0 fL   MCH 27.9 26.0 - 34.0 pg   MCHC 32.7 30.0 - 36.0 g/dL   RDW 29.515.6 (H) 28.411.5 - 13.215.5 %   Platelets 239 150 - 400 K/uL  Differential  Result Value Ref Range   Neutrophils Relative % 46 %   Neutro Abs 3.1 1.7 - 7.7 K/uL   Lymphocytes Relative 44 %   Lymphs Abs 3.0 0.7 -  4.0 K/uL   Monocytes Relative 5 %   Monocytes Absolute 0.4 0.1 - 1.0 K/uL   Eosinophils Relative 4 %   Eosinophils Absolute 0.3 0.0 - 0.7 K/uL   Basophils Relative 1 %   Basophils Absolute 0.0 0.0 - 0.1 K/uL  Comprehensive metabolic panel  Result Value Ref Range   Sodium 141 135 - 145 mmol/L   Potassium 3.5 3.5 - 5.1 mmol/L   Chloride 107 101 - 111 mmol/L   CO2 22 22 - 32 mmol/L   Glucose, Bld 118 (H) 65 - 99 mg/dL   BUN 9 6 - 20 mg/dL   Creatinine, Ser 2.13 0.44 - 1.00 mg/dL   Calcium 9.3 8.9 - 08.6 mg/dL   Total Protein 7.6 6.5 - 8.1 g/dL   Albumin 3.8 3.5 - 5.0 g/dL   AST 28 15 - 41 U/L   ALT 26 14 - 54 U/L   Alkaline Phosphatase 58 38 - 126 U/L   Total Bilirubin 0.3 0.3 - 1.2 mg/dL   GFR calc non Af Amer >60 >60 mL/min   GFR calc Af Amer >60 >60 mL/min   Anion gap 12 5 - 15  I-stat troponin, ED  Result Value Ref Range   Troponin i, poc 0.00 0.00 - 0.08 ng/mL   Comment 3          CBG monitoring, ED  Result Value Ref Range   Glucose-Capillary 119 (H) 65 - 99 mg/dL   Comment 1 Notify RN    Comment 2 Call MD NNP PA CNM    Comment 3 Document in Chart   I-Stat Chem 8, ED  Result Value Ref Range   Sodium 143 135 - 145 mmol/L   Potassium 3.4 (L) 3.5 - 5.1 mmol/L   Chloride 107 101 - 111 mmol/L   BUN 9 6 - 20 mg/dL   Creatinine, Ser 5.78 0.44 - 1.00 mg/dL   Glucose, Bld 469 (H) 65 - 99 mg/dL   Calcium, Ion 6.29 (L) 1.15 - 1.40 mmol/L   TCO2 23 0 - 100 mmol/L   Hemoglobin 12.9  12.0 - 15.0 g/dL   HCT 52.8 41.3 - 24.4 %  I-Stat CG4 Lactic Acid, ED  Result Value Ref Range   Lactic Acid, Venous 3.12 (HH) 0.5 - 1.9 mmol/L   Comment NOTIFIED PHYSICIAN    Ct Head Code Stroke W/o Cm  Result Date: 03/18/2016 CLINICAL DATA:  Code stroke.  Aphasia.  Diabetes and hypertension EXAM: CT HEAD WITHOUT CONTRAST TECHNIQUE: Contiguous axial images were obtained from the base of the skull through the vertex without intravenous contrast. COMPARISON:  None. FINDINGS: Brain: No evidence of acute infarction, hemorrhage, hydrocephalus, extra-axial collection or mass lesion/mass effect. Vascular: No hyperdense vessel or unexpected calcification. Skull: Negative Sinuses/Orbits: Negative Other: None ASPECTS (Alberta Stroke Program Early CT Score) - Ganglionic level infarction (caudate, lentiform nuclei, internal capsule, insula, M1-M3 cortex): 7 - Supraganglionic infarction (M4-M6 cortex): 3 Total score (0-10 with 10 being normal): 10 IMPRESSION: 1. Negative CT head 2. ASPECTS is 10 Electronically Signed   By: Marlan Palau M.D.   On: 03/18/2016 20:57    EKG  EKG Interpretation None       Radiology Ct Head Code Stroke W/o Cm  Result Date: 03/18/2016 CLINICAL DATA:  Code stroke.  Aphasia.  Diabetes and hypertension EXAM: CT HEAD WITHOUT CONTRAST TECHNIQUE: Contiguous axial images were obtained from the base of the skull through the vertex without intravenous contrast. COMPARISON:  None. FINDINGS: Brain: No evidence  of acute infarction, hemorrhage, hydrocephalus, extra-axial collection or mass lesion/mass effect. Vascular: No hyperdense vessel or unexpected calcification. Skull: Negative Sinuses/Orbits: Negative Other: None ASPECTS (Alberta Stroke Program Early CT Score) - Ganglionic level infarction (caudate, lentiform nuclei, internal capsule, insula, M1-M3 cortex): 7 - Supraganglionic infarction (M4-M6 cortex): 3 Total score (0-10 with 10 being normal): 10 IMPRESSION: 1. Negative CT head 2.  ASPECTS is 10 Electronically Signed   By: Marlan Palau M.D.   On: 03/18/2016 20:57    Procedures Procedures (including critical care time)  Medications Ordered in ED Medications - No data to display   Initial Impression / Assessment and Plan / ED Course  I have reviewed the triage vital signs and the nursing notes.  Pertinent labs & imaging results that were available during my care of the patient were reviewed by me and considered in my medical decision making (see chart for details).  Clinical Course     Iv ns. Monitor. Pulse ox. Labs.  Reviewed nursing notes and prior charts for additional history.   Initial lactate high. No fevers/chills or current sign of infection. Will give ivf.  Neurology has evaluated, is recommending admission for further workup, and they feel is not candidate for tpa.  On recheck, pt is moving bil extremities purposefully, and is now responding verbally to questions although in inconsistent manner, often with whispered speech but not dysarthric. No neck stiffness or rigidity. Vitals normal.   Medical service consulted for admission.  Discussed pt with Dr Toniann Fail - indicates temp orders to tele obs.     Final Clinical Impressions(s) / ED Diagnoses   Final diagnoses:  None    New Prescriptions New Prescriptions   No medications on file         Cathren Laine, MD 03/18/16 2249

## 2016-03-18 NOTE — Code Documentation (Signed)
Code stroke called at 2021 for this 56 y/o black female pt who was LSW at 1900 at home having just returned from an outing.  According to EMT family stated that  upon pt's  return home pt said "I'm not going anywhere", she then began laughing , slumped over, became incontinent  of stool and went mute. No seizure activity noted per family. EMS was summoned. BP palp initial 190 with CBG 161.  Repeat BP 161/70.  Pt arrived [facility] at 2034, was cleared for CT by Dr Ashok Cordia at 2036, was met by Dr Cheral Marker enroute to [place] arriving there at 2039. CT neg.  The NIHSS was partially  conducted in the radioolgy suite and completed in [facility] Trauma C.  Pt scored a 9 on the NIHSS with points given for  Inability to state age and month, did not close or open eyes to command, drift to Right and left leg( pt has neuropathy), decreased sensation on left side, moderate aphasia and moderate dysarthria. Pt was initially mute upon arrival to [facility]  But had return of speech though dysarthric and also very whispered speech in radiology suite. Upon return to trauma C pt co right parietal HA,  unable to rate and a "little" chest pain.   Troponin 0 LA 3.12    Pt was taken to MRI at 2112. MRI neg . Code stroke cancelled at 2144 per Dr Cheral Marker.  Pt to be admitted to medicine service

## 2016-03-19 ENCOUNTER — Encounter (HOSPITAL_COMMUNITY): Payer: Self-pay | Admitting: Internal Medicine

## 2016-03-19 ENCOUNTER — Observation Stay (HOSPITAL_BASED_OUTPATIENT_CLINIC_OR_DEPARTMENT_OTHER)
Admit: 2016-03-19 | Discharge: 2016-03-19 | Disposition: A | Discharge status: Home / Self Care | Payer: Medicare Other | Attending: Internal Medicine | Admitting: Internal Medicine

## 2016-03-19 DIAGNOSIS — I10 Essential (primary) hypertension: Secondary | ICD-10-CM | POA: Diagnosis not present

## 2016-03-19 DIAGNOSIS — E1169 Type 2 diabetes mellitus with other specified complication: Secondary | ICD-10-CM

## 2016-03-19 DIAGNOSIS — R4701 Aphasia: Secondary | ICD-10-CM

## 2016-03-19 DIAGNOSIS — E669 Obesity, unspecified: Secondary | ICD-10-CM | POA: Diagnosis not present

## 2016-03-19 DIAGNOSIS — G9340 Encephalopathy, unspecified: Secondary | ICD-10-CM | POA: Diagnosis not present

## 2016-03-19 LAB — LACTIC ACID, PLASMA: Lactic Acid, Venous: 1.1 mmol/L (ref 0.5–1.9)

## 2016-03-19 LAB — GLUCOSE, CAPILLARY
GLUCOSE-CAPILLARY: 92 mg/dL (ref 65–99)
GLUCOSE-CAPILLARY: 87 mg/dL (ref 65–99)
GLUCOSE-CAPILLARY: 112 mg/dL — AB (ref 65–99)
GLUCOSE-CAPILLARY: 146 mg/dL — AB (ref 65–99)
Glucose-Capillary: 86 mg/dL (ref 65–99)

## 2016-03-19 LAB — TSH: TSH: 0.491 u[IU]/mL (ref 0.350–4.500)

## 2016-03-19 LAB — AMMONIA: Ammonia: 57 umol/L — ABNORMAL HIGH (ref 9–35)

## 2016-03-19 MED ORDER — HYDRALAZINE HCL 20 MG/ML IJ SOLN: 10.0000 mg | INTRAMUSCULAR | Status: DC | PRN

## 2016-03-19 MED ORDER — INSULIN ASPART 100 UNIT/ML ~~LOC~~ SOLN
0.0000 [IU] | SUBCUTANEOUS | Status: DC
  Administered 2016-03-19: 1 [IU] via SUBCUTANEOUS

## 2016-03-19 MED ORDER — ACETAMINOPHEN 325 MG PO TABS: 650.0000 mg | ORAL_TABLET | Freq: Four times a day (QID) | ORAL | Status: DC | PRN

## 2016-03-19 MED ORDER — ONDANSETRON HCL 4 MG/2ML IJ SOLN: 4.0000 mg | Freq: Four times a day (QID) | INTRAMUSCULAR | Status: DC | PRN

## 2016-03-19 MED ORDER — ACETAMINOPHEN 650 MG RE SUPP: 650.0000 mg | Freq: Four times a day (QID) | RECTAL | Status: DC | PRN

## 2016-03-19 MED ORDER — LACTULOSE 10 GM/15ML PO SOLN
10.0000 g | Freq: Two times a day (BID) | ORAL | Status: DC
  Administered 2016-03-19: 10 g via ORAL
  Filled 2016-03-19: qty 15

## 2016-03-19 MED ORDER — ENOXAPARIN SODIUM 40 MG/0.4ML ~~LOC~~ SOLN: 40.0000 mg | SUBCUTANEOUS | Status: DC

## 2016-03-19 MED ORDER — ONDANSETRON HCL 4 MG PO TABS: 4.0000 mg | ORAL_TABLET | Freq: Four times a day (QID) | ORAL | Status: DC | PRN

## 2016-03-19 MED ORDER — METOPROLOL TARTRATE 5 MG/5ML IV SOLN
2.5000 mg | Freq: Four times a day (QID) | INTRAVENOUS | Status: DC
  Administered 2016-03-19 (×2): 2.5 mg via INTRAVENOUS
  Filled 2016-03-19 (×3): qty 5

## 2016-03-19 MED ORDER — SODIUM CHLORIDE 0.9 % IV SOLN
INTRAVENOUS | Status: DC
  Administered 2016-03-19: 04:00:00 via INTRAVENOUS

## 2016-03-19 NOTE — Progress Notes (Signed)
STROKE TEAM PROGRESS NOTE   HISTORY OF PRESENT ILLNESS (per record) Andreas BlowerFrances Y Stiefel is an 56 y.o. female who presents via EMS as a Code Stroke after sudden onset of speech arrest with eyes-open unresponsive state. She was at home with family speaking when she suddenly slumped over partially. The family asked what was wrong and she stated "I'm not going anywhere" laughed briefly, then became completely nonverbal. She then lost control of her bowels. Family attempted to get her to the bathroom, requiring 5 family members to "drag her there". EMS was called and on arrival she remained nonverbal, but awake. No definite lateralized weakness or facial droop per EMS. Symptom onset was 7:00 PM 03/18/2016 (LKW).  During the Neurological examination, she recovered some verbal ability, speaking slowly and hesitantly in a whisper. She is able to relate that she has a right temporal headache, but no neck or abdominal limb pain. She has "a little" chest pain and mild LLE pain. She denies prior history of stroke or MI. She had a sleeve gastrectomy in the Fall of 2017 and as of most recent follow up note, she has lost over 20 lbs but is frustrated by the "slow rate" of weight loss. Her PMHx includes HTN, DM, arthritis and gout. She denies being on ASA or a blood thinner.   MRI negative for restricted diffusion. MRA negative for LVO.   Patient was not administered TPA secondary to Due to nonlateralizing exam with multiple inconsistencies suggestive of etiologies other than stroke, as outlined in the assessment/plan. She was admitted for further evaluation and treatment.   SUBJECTIVE (INTERVAL HISTORY) Her family Elam Dutchmemvber is at the bedside.  Overall she feels her condition is stable. She recounted recent increase in stress, caring for family member with hospice called in yesterday. Recounted confusion. recent sleep deprivation, not eating well.   OBJECTIVE Temp:  [97.9 F (36.6 C)-98 F (36.7 C)] 98 F (36.7 C)  (01/08 0145) Pulse Rate:  [70-80] 74 (01/08 1239) Cardiac Rhythm: Normal sinus rhythm;Heart block (01/08 0705) Resp:  [17-22] 20 (01/08 0600) BP: (126-173)/(62-83) 159/71 (01/08 1239) SpO2:  [94 %-99 %] 98 % (01/08 1239) Weight:  [110.6 kg (243 lb 13.3 oz)-112.3 kg (247 lb 9.2 oz)] 110.6 kg (243 lb 13.3 oz) (01/08 0145)  CBC:   Recent Labs Lab 03/18/16 2039 03/18/16 2046  WBC 6.8  --   NEUTROABS 3.1  --   HGB 12.1 12.9  HCT 37.0 38.0  MCV 85.3  --   PLT 239  --     Basic Metabolic Panel:   Recent Labs Lab 03/18/16 2039 03/18/16 2046  NA 141 143  K 3.5 3.4*  CL 107 107  CO2 22  --   GLUCOSE 118* 119*  BUN 9 9  CREATININE 0.84 0.80  CALCIUM 9.3  --     HgbA1c:  Lab Results  Component Value Date   HGBA1C 10.0 (H) 12/29/2015   Urine Drug Screen: No results found for: LABOPIA, COCAINSCRNUR, LABBENZ, AMPHETMU, THCU, LABBARB    IMAGING  Mr Brain Wo Contrast Mr Maxine GlennMra Head Wo Contrast 03/18/2016 1. Motion degraded study. 2. No acute infarction, hemorrhage, hydrocephalus, extra-axial collection or mass lesion of the brain. 3. No proximal large vessel occlusion of circle of Willis.   Dg Chest Port 1 View 03/18/2016 Findings consistent with a degree of congestive heart failure. No airspace consolidation.   Ct Head Code Stroke W/o Cm 03/18/2016 1. Negative CT head 2. ASPECTS is 10   EEG This awake  and asleep EEG is abnormal due to occasional focal slowing over the left temporal region.   PHYSICAL EXAM Pleasant obese middle aged [place] American lady not in distress. . Afebrile. Head is nontraumatic. Neck is supple without bruit.    Cardiac exam no murmur or gallop. Lungs are clear to auscultation. Distal pulses are well felt. Neurological Exam ;  Awake  Alert oriented x 3. Normal speech and language.eye movements full without nystagmus.fundi were not visualized. Vision acuity and fields appear normal. Hearing is normal. Palatal movements are normal. Face symmetric.  Tongue midline. Normal strength, tone, reflexes and coordination. Normal sensation. Gait deferred.  ASSESSMENT/PLAN Ms. NASHLEY CORDOBA is a 56 y.o. female with history of hypertension, diabetes mellitus type 2, gout who had recent gastric sleeve surgery for obesity in October 2017 presenting with acute onset of speech arrest and bowel incontinence. She did not receive IV t-PA due to inconsistent exam, negative MRI.   Confusion/encephalopathy, possible seizure in setting of sleep deprivation, poor po intake and increased stress  MRI  No acute stroke  MRA  No large vessel disease  EEG no seizure activity  Lovenox 40 mg sq daily for VTE prophylaxis Diet regular Room service appropriate? Yes; Fluid consistency: Thin  No antithrombotic prior to admission  Disposition:  Return home  Nothing further to add from stroke/neuro standpoint  Ok for discharge from our standpoint  No further neuro followup needed  Call for questions.  Hypertension  Elevated  BP goal normotensive  Diabet es  HgbA1c 10.0 in Oct, goal < 7.0  Uncontrolled  Other Stroke Risk Factors  Obesity, Body mass index is 39.35 kg/m., recommend weight loss, diet and exercise as appropriate. Recent gastric sleeve surgery in Oct 2017  Other Active Problems  Hx gout  Hospital day # 0  Rhoderick Moody [facility] See Amion for Pager information 03/19/2016 5:07 PM  I have personally examined this patient, reviewed notes, independently viewed imaging studies, participated in medical decision making and plan of care.ROS completed by me personally and pertinent positives fully documented  I have made any additions or clarifications directly to the above note. Agree with note above. Patient presented with episodes of transient confusion, disorientation and memory loss of unclear etiology possibly unwitnessed seizure with postictal state triggered by sleep deprivation and underlying stress. Recommend check  EEG.long discussion with patient and husband at the bedside and consult questions. Greater than 50% time during this 35 minute visit work spent on counseling and coordination of care about workup for stroke and seizures  Delia Heady, MD Medical Director [facility] Pager: [phone] 03/19/2016 11:03 PM  To contact Stroke Continuity provider, please refer to [email or web address]. After hours, contact General Neurology

## 2016-03-19 NOTE — Progress Notes (Signed)
Patient admitted after midnight, please see H&P.  EEG pending. PT eval Marlin CanaryJessica Kaion Tisdale DO

## 2016-03-19 NOTE — Discharge Summary (Signed)
Physician Discharge Summary  Lacey Meyer OZD:[id number] DOB: [birth year] DOA: 03/18/2016  PCP: Dorrene German, MD  Admit date: 03/18/2016 Discharge date: 03/19/2016   Recommendations for Outpatient Follow-Up:   1. Outpatient referral to neurology for follow up of EEG   Discharge Diagnosis:   Principal Problem:   Acute encephalopathy Active Problems:   HTN (hypertension)   Diabetes mellitus type 2 in obese [facility])   Discharge disposition:  Home.  Discharge Condition: Improved.  Diet recommendation: Low sodium, heart healthy.  Wound care: None.   History of Present Illness:   Lacey Meyer is a 56 y.o. female with history of hypertension, diabetes mellitus type 2, gout who had recent gastric sleeve surgery for obesity in October 2017 was noticed to be confused and had difficulty speaking around 7 PM last night. As per patient's daughter who provided the history patient had gone for shopping and on returning patient was going to the wrong house initially then later came to the house and went upstairs had a bowel movement and was sitting on a recliner. Patient was not talking well and appeared confused. On trying to make her stand patient slumped to the floor and had incontinence of bowel. Patient continued to remain confused and was brought to the ER as code stroke. Neurologist on-call was consulted. MRI of the brain is negative for stroke. On exam patient is becoming more alert and following commands but still not talking. Patient is being admitted for further observation and management of acute change in mental status and difficulty speaking. Patient is afebrile.    Hospital Course by Problem:   Acute encephalopathy with difficulty talking and generalized weakness -  -MRI negative -EEG shows some  focal cerebral dysfunction over the left temporal region -- discussed with Dr. Cedric Fishman on call for neuro who suggested outpatient neuro follow up, no medication needs at this  time -lactulose mildly elevated but patient's symptoms resolved without any treatment -patient has been under tremendous stress and not eating nor drinking well  Hypertension - resume home meds  Diabetes mellitus type 2 - resume home meds  History of gout - no signs of exacerbation.    Medical Consultants:    Neuro   Discharge Exam:   Vitals:   03/19/16 0600 03/19/16 1239  BP: (!) 162/73 (!) 159/71  Pulse: 70 74  Resp: 20   Temp:     Vitals:   03/19/16 0215 03/19/16 0400 03/19/16 0600 03/19/16 1239  BP: (!) 173/83 (!) 151/69 (!) 162/73 (!) 159/71  Pulse: 78 73 70 74  Resp: 20 18 20    Temp:      TempSrc:      SpO2: 96% 97% 99% 98%  Weight:      Height:        Gen:  NAD- back to normal-- wants to go home   The results of significant diagnostics from this hospitalization (including imaging, microbiology, ancillary and laboratory) are listed below for reference.     Procedures and Diagnostic Studies:   No results found.   Labs:   Basic Metabolic Panel:  Recent Labs Lab 03/18/16 2039 03/18/16 2046  NA 141 143  K 3.5 3.4*  CL 107 107  CO2 22  --   GLUCOSE 118* 119*  BUN 9 9  CREATININE 0.84 0.80  CALCIUM 9.3  --    GFR Estimated Creatinine Clearance: 97.7 mL/min (by C-G formula based on SCr of 0.8 mg/dL). Liver Function Tests:  Recent Labs Lab 03/18/16  2039  AST 28  ALT 26  ALKPHOS 58  BILITOT 0.3  PROT 7.6  ALBUMIN 3.8   No results for input(s): LIPASE, AMYLASE in the last 168 hours.  Recent Labs Lab 03/19/16 0417  AMMONIA 57*   Coagulation profile  Recent Labs Lab 03/18/16 2039  INR 1.15    CBC:  Recent Labs Lab 03/18/16 2039 03/18/16 2046  WBC 6.8  --   NEUTROABS 3.1  --   HGB 12.1 12.9  HCT 37.0 38.0  MCV 85.3  --   PLT 239  --    Cardiac Enzymes: No results for input(s): CKTOTAL, CKMB, CKMBINDEX, TROPONINI in the last 168 hours. BNP: Invalid input(s): POCBNP CBG:  Recent Labs Lab 03/19/16 0229  03/19/16 0405 03/19/16 0829 03/19/16 1125 03/19/16 1641  GLUCAP 92 87 112* 146* 86   D-Dimer No results for input(s): DDIMER in the last 72 hours. Hgb A1c No results for input(s): HGBA1C in the last 72 hours. Lipid Profile No results for input(s): CHOL, HDL, LDLCALC, TRIG, CHOLHDL, LDLDIRECT in the last 72 hours. Thyroid function studies  Recent Labs  03/19/16 0417  TSH 0.491   Anemia work up No results for input(s): VITAMINB12, FOLATE, FERRITIN, TIBC, IRON, RETICCTPCT in the last 72 hours. Microbiology No results found for this or any previous visit (from the past 240 hour(s)).   Discharge Instructions:   Discharge Instructions    Discharge instructions    Complete by:  As directed    outpatient follow up with neuro Healthy stress relief resume previous bariatric diet   Increase activity slowly    Complete by:  As directed      Allergies as of 03/19/2016      Reactions   Ampicillin Anaphylaxis, Hives, Other (See Comments)   Has patient had a PCN reaction causing immediate rash, facial/tongue/throat swelling, SOB or lightheadedness with hypotension: Yes Has patient had a PCN reaction causing severe rash involving mucus membranes or skin necrosis: Yes Has patient had a PCN reaction that required hospitalization Yes Has patient had a PCN reaction occurring within the last 10 years: No If all of the above answers are "NO", then may proceed with Cephalosporin use.   Doxycycline Other (See Comments)   unknown   Other Swelling, Rash, Other (See Comments)   Blistex      Medication List    STOP taking these medications   zolpidem 10 MG tablet Commonly known as:  AMBIEN     TAKE these medications   allopurinol 300 MG tablet Commonly known as:  ZYLOPRIM Take 1 tablet (300 mg total) by mouth daily.   amLODipine 10 MG tablet Commonly known as:  NORVASC Take 10 mg by mouth daily.   benazepril 40 MG tablet Commonly known as:  LOTENSIN Take 40 mg by mouth daily.    diphenhydrAMINE 25 MG tablet Commonly known as:  BENADRYL Take 25 mg by mouth every 6 (six) hours as needed for allergies.   furosemide 40 MG tablet Commonly known as:  LASIX Take 40 mg by mouth daily.   glimepiride 4 MG tablet Commonly known as:  AMARYL Take 4 mg by mouth daily.   LYRICA 200 MG capsule Generic drug:  pregabalin Take 200 mg by mouth 3 (three) times daily.   metFORMIN 500 MG tablet Commonly known as:  GLUCOPHAGE Take 500 mg by mouth 2 (two) times daily.   metoprolol succinate 50 MG 24 hr tablet Commonly known as:  TOPROL-XL Take 50 mg by mouth daily.  Oxycodone HCl 10 MG Tabs Take 10 mg by mouth every 8 (eight) hours as needed.   pantoprazole 40 MG tablet Commonly known as:  PROTONIX Take 40 mg by mouth daily as needed (indigestion).   XULTOPHY 100-3.6 UNIT-MG/ML Sopn Generic drug:  Insulin Degludec-Liraglutide Inject 30 Int'l Units/L into the skin as needed (for high blood sugar).         Time coordinating discharge: 35 min  Signed:  Devario Bucklew U Prudy Candy   [facility] 03/19/2016, 5:39 PM

## 2016-03-19 NOTE — H&P (Signed)
History and Physical    Lacey Meyer XLK:[id number] DOB: September 20, 1960 DOA: 03/18/2016  PCP: Dorrene German, MD  Patient coming from: Home.  Chief Complaint: Change in mental status and difficulty speaking.  HPI: Lacey Meyer is a 56 y.o. female with history of hypertension, diabetes mellitus type 2, gout who had recent gastric sleeve surgery for obesity in October 2017 was noticed to be confused and had difficulty speaking around 7 PM last night. As per patient's daughter who provided the history patient had gone for shopping and on returning patient was going to the wrong house initially then later came to the house and went upstairs had a bowel movement and was sitting on a recliner. Patient was not talking well and appeared confused. On trying to make her stand patient slumped to the floor and had incontinence of bowel. Patient continued to remain confused and was brought to the ER as code stroke. Neurologist on-call was consulted. MRI of the brain is negative for stroke. On exam patient is becoming more alert and following commands but still not talking. Patient is being admitted for further observation and management of acute change in mental status and difficulty speaking. Patient is afebrile.   ED Course: CT head and MRI brain was negative. UA and chest x-ray were unremarkable for any infectious process.  Review of Systems: As per HPI, rest all negative.   Past Medical History:  Diagnosis Date  . Anemia   . Arthritis   . Diabetes mellitus without complication (HCC)   . GERD (gastroesophageal reflux disease)   . Gout   . History of blood transfusion   . HTN (hypertension)   . Impaired vision     Past Surgical History:  Procedure Laterality Date  . BTL    . EYE SURGERY     cataract surgery / right eye   . FOOT SURGERY     bilateral   . LAPAROSCOPIC GASTRIC SLEEVE RESECTION N/A 01/03/2016   Procedure: LAPAROSCOPIC GASTRIC SLEEVE RESECTION WITH UPPER ENDO;   Surgeon: Ovidio Kin, MD;  Location: [facility];  Service: General;  Laterality: N/A;  . SHOULDER ARTHROSCOPY W/ ROTATOR CUFF REPAIR     left   . TUBAL LIGATION       reports that she quit smoking about 6 years ago. Her smoking use included Cigarettes. She has a 0.25 pack-year smoking history. She has never used smokeless tobacco. She reports that she does not drink alcohol or use drugs.  Allergies  Allergen Reactions  . Ampicillin Anaphylaxis and Hives    Has patient had a PCN reaction causing immediate rash, facial/tongue/throat swelling, SOB or lightheadedness with hypotension: Yes Has patient had a PCN reaction causing severe rash involving mucus membranes or skin necrosis: Yes Has patient had a PCN reaction that required hospitalization Yes Has patient had a PCN reaction occurring within the last 10 years: No If all of the above answers are "NO", then may proceed with Cephalosporin use.   Marland Kitchen Doxycycline     unknown  . Other Swelling and Rash    Blistex    Family History  Problem Relation Age of Onset  . Hypertension Mother   . Hypertension Father     Prior to Admission medications   Medication Sig Start Date End Date Taking? Authorizing Provider  allopurinol (ZYLOPRIM) 300 MG tablet Take 1 tablet (300 mg total) by mouth daily. 10/03/11  Yes Elvina Sidle, MD  amLODipine (NORVASC) 10 MG tablet Take 10 mg by mouth  daily.  03/07/15  Yes Historical Provider, MD  benazepril (LOTENSIN) 40 MG tablet Take 40 mg by mouth daily.  03/11/15  Yes Historical Provider, MD  diphenhydrAMINE (BENADRYL) 25 MG tablet Take 25 mg by mouth every 6 (six) hours as needed for allergies.   Yes Historical Provider, MD  furosemide (LASIX) 40 MG tablet Take 40 mg by mouth daily.  04/02/15  Yes Historical Provider, MD  metFORMIN (GLUCOPHAGE) 500 MG tablet Take 500 mg by mouth 2 (two) times daily.  02/28/15  Yes Historical Provider, MD  Dulaglutide (TRULICITY) 1.5 MG/0.5ML SOPN Inject 1.5 mg into the skin once  a week. Wednesdays 12/28/15   Historical Provider, MD  glimepiride (AMARYL) 4 MG tablet Take 4 mg by mouth daily. 10/16/15   Historical Provider, MD  Insulin Degludec-Liraglutide (XULTOPHY) 100-3.6 UNIT-MG/ML SOPN Inject 30 Int'l Units/L into the skin once.    Historical Provider, MD  LYRICA 200 MG capsule Take 200 mg by mouth 3 (three) times daily.  08/04/15   Historical Provider, MD  metoprolol succinate (TOPROL-XL) 25 MG 24 hr tablet Take 100 mg by mouth daily.  12/28/15   Historical Provider, MD  oxyCODONE-acetaminophen (PERCOCET/ROXICET) 5-325 MG tablet Take 1 tablet by mouth every 8 (eight) hours as needed for moderate pain.  04/06/15   Historical Provider, MD  pantoprazole (PROTONIX) 40 MG tablet Take 40 mg by mouth daily as needed (indigestion).  04/24/15   Historical Provider, MD  zolpidem (AMBIEN) 10 MG tablet Take 10 mg by mouth at bedtime as needed for sleep.  03/18/15   Historical Provider, MD    Physical Exam: Vitals:   03/19/16 0100 03/19/16 0114 03/19/16 0145 03/19/16 0215  BP: 137/72  169/80 (!) 173/83  Pulse: 79  80 78  Resp: 17  20 20   Temp:  97.9 F (36.6 C) 98 F (36.7 C)   TempSrc:  Oral Oral   SpO2: 95%  96% 96%  Weight:   110.6 kg (243 lb 13.3 oz)   Height:   5\' 6"  (1.676 m)       Constitutional: Moderately built and nourished. Vitals:   03/19/16 0100 03/19/16 0114 03/19/16 0145 03/19/16 0215  BP: 137/72  169/80 (!) 173/83  Pulse: 79  80 78  Resp: 17  20 20   Temp:  97.9 F (36.6 C) 98 F (36.7 C)   TempSrc:  Oral Oral   SpO2: 95%  96% 96%  Weight:   110.6 kg (243 lb 13.3 oz)   Height:   5\' 6"  (1.676 m)    Eyes: Anicteric. no pallor. ENMT: No discharge from the ears eyes nose and mouth. Neck: No mass felt. No neck rigidity. Respiratory: No rhonchi or crepitations. Cardiovascular: S1-S2 heard no murmurs appreciated. Abdomen: Soft nontender bowel sounds present. No guarding or rigidity. Musculoskeletal: No edema. No joint effusion. Skin: No rashes skin  appears warm. Neurologic: Mildly lethargic but follows commands and moves all extremities. Patient not talking. Pupils equal and reacting to light. No facial asymmetry. Tongue is midline. Psychiatric: Appears lethargic.   Labs on Admission: I have personally reviewed following labs and imaging studies  CBC:  Recent Labs Lab 03/18/16 2039 03/18/16 2046  WBC 6.8  --   NEUTROABS 3.1  --   HGB 12.1 12.9  HCT 37.0 38.0  MCV 85.3  --   PLT 239  --    Basic Metabolic Panel:  Recent Labs Lab 03/18/16 2039 03/18/16 2046  NA 141 143  K 3.5 3.4*  CL 107 107  CO2 22  --   GLUCOSE 118* 119*  BUN 9 9  CREATININE 0.84 0.80  CALCIUM 9.3  --    GFR: Estimated Creatinine Clearance: 97.7 mL/min (by C-G formula based on SCr of 0.8 mg/dL). Liver Function Tests:  Recent Labs Lab 03/18/16 2039  AST 28  ALT 26  ALKPHOS 58  BILITOT 0.3  PROT 7.6  ALBUMIN 3.8   No results for input(s): LIPASE, AMYLASE in the last 168 hours. No results for input(s): AMMONIA in the last 168 hours. Coagulation Profile:  Recent Labs Lab 03/18/16 2039  INR 1.15   Cardiac Enzymes: No results for input(s): CKTOTAL, CKMB, CKMBINDEX, TROPONINI in the last 168 hours. BNP (last 3 results) No results for input(s): PROBNP in the last 8760 hours. HbA1C: No results for input(s): HGBA1C in the last 72 hours. CBG:  Recent Labs Lab 03/18/16 2038 03/19/16 0229  GLUCAP 119* 92   Lipid Profile: No results for input(s): CHOL, HDL, LDLCALC, TRIG, CHOLHDL, LDLDIRECT in the last 72 hours. Thyroid Function Tests: No results for input(s): TSH, T4TOTAL, FREET4, T3FREE, THYROIDAB in the last 72 hours. Anemia Panel: No results for input(s): VITAMINB12, FOLATE, FERRITIN, TIBC, IRON, RETICCTPCT in the last 72 hours. Urine analysis:    Component Value Date/Time   COLORURINE YELLOW 03/18/2016 2307   APPEARANCEUR HAZY (A) 03/18/2016 2307   LABSPEC 1.020 03/18/2016 2307   PHURINE 5.0 03/18/2016 2307   GLUCOSEU  NEGATIVE 03/18/2016 2307   HGBUR NEGATIVE 03/18/2016 2307   BILIRUBINUR NEGATIVE 03/18/2016 2307   KETONESUR NEGATIVE 03/18/2016 2307   PROTEINUR NEGATIVE 03/18/2016 2307   UROBILINOGEN 0.2 08/05/2011 1614   NITRITE POSITIVE (A) 03/18/2016 2307   LEUKOCYTESUR NEGATIVE 03/18/2016 2307   Sepsis Labs: @LABRCNTIP (procalcitonin:4,lacticidven:4) )No results found for this or any previous visit (from the past 240 hour(s)).   Radiological Exams on Admission: Mr Shirlee Latch NF Contrast  Result Date: 03/18/2016 CLINICAL DATA:  56 y/o F; altered mental status with concern for stroke. EXAM: MRI HEAD WITHOUT CONTRAST MRA HEAD WITHOUT CONTRAST TECHNIQUE: Multiplanar, multiecho pulse sequences of the brain and surrounding structures were obtained without intravenous contrast. Angiographic images of the head were obtained using MRA technique without contrast. COMPARISON:  03/18/2016 CT head FINDINGS: MRI HEAD FINDINGS Moderate motion degradation on multiple pulse sequences. Brain: No acute infarction, hemorrhage, hydrocephalus, extra-axial collection or mass lesion. Vascular: As below. Skull and upper cervical spine: Normal marrow signal. Sinuses/Orbits: Ethmoid sinus mucosal thickening. No abnormal signal of mastoid air cells. Right intra-ocular lens replacement. Other: Negative. MRA HEAD FINDINGS Severe motion degradation of time-of-flight MRA. Suboptimal evaluation for stenosis or small aneurysms. There is no proximal large vessel occlusion of the circle of Willis. IMPRESSION: 1. Motion degraded study. 2. No acute infarction, hemorrhage, hydrocephalus, extra-axial collection or mass lesion of the brain. 3. No proximal large vessel occlusion of circle of Willis. Electronically Signed   By: Mitzi Hansen M.D.   On: 03/18/2016 22:10   Mr Brain Wo Contrast  Result Date: 03/18/2016 CLINICAL DATA:  55 y/o F; altered mental status with concern for stroke. EXAM: MRI HEAD WITHOUT CONTRAST MRA HEAD WITHOUT  CONTRAST TECHNIQUE: Multiplanar, multiecho pulse sequences of the brain and surrounding structures were obtained without intravenous contrast. Angiographic images of the head were obtained using MRA technique without contrast. COMPARISON:  03/18/2016 CT head FINDINGS: MRI HEAD FINDINGS Moderate motion degradation on multiple pulse sequences. Brain: No acute infarction, hemorrhage, hydrocephalus, extra-axial collection or mass lesion. Vascular: As below. Skull and upper cervical spine: Normal  marrow signal. Sinuses/Orbits: Ethmoid sinus mucosal thickening. No abnormal signal of mastoid air cells. Right intra-ocular lens replacement. Other: Negative. MRA HEAD FINDINGS Severe motion degradation of time-of-flight MRA. Suboptimal evaluation for stenosis or small aneurysms. There is no proximal large vessel occlusion of the circle of Willis. IMPRESSION: 1. Motion degraded study. 2. No acute infarction, hemorrhage, hydrocephalus, extra-axial collection or mass lesion of the brain. 3. No proximal large vessel occlusion of circle of Willis. Electronically Signed   By: Mitzi Hansen M.D.   On: 03/18/2016 22:10   Dg Chest Port 1 View  Result Date: 03/18/2016 CLINICAL DATA:  Altered mental status EXAM: PORTABLE CHEST 1 VIEW COMPARISON:  None. FINDINGS: There is interstitial edema with cardiomegaly and mild pulmonary venous hypertension. There is no appreciable airspace consolidation. There is atherosclerotic calcification in the aorta. No bone lesions. IMPRESSION: Findings consistent with a degree of congestive heart failure. No airspace consolidation. Electronically Signed   By: Bretta Bang III M.D.   On: 03/18/2016 22:27   Ct Head Code Stroke W/o Cm  Result Date: 03/18/2016 CLINICAL DATA:  Code stroke.  Aphasia.  Diabetes and hypertension EXAM: CT HEAD WITHOUT CONTRAST TECHNIQUE: Contiguous axial images were obtained from the base of the skull through the vertex without intravenous contrast. COMPARISON:   None. FINDINGS: Brain: No evidence of acute infarction, hemorrhage, hydrocephalus, extra-axial collection or mass lesion/mass effect. Vascular: No hyperdense vessel or unexpected calcification. Skull: Negative Sinuses/Orbits: Negative Other: None ASPECTS (Alberta Stroke Program Early CT Score) - Ganglionic level infarction (caudate, lentiform nuclei, internal capsule, insula, M1-M3 cortex): 7 - Supraganglionic infarction (M4-M6 cortex): 3 Total score (0-10 with 10 being normal): 10 IMPRESSION: 1. Negative CT head 2. ASPECTS is 10 Electronically Signed   By: Marlan Palau M.D.   On: 03/18/2016 20:57    EKG: Independently reviewed. Normal sinus rhythm.  Assessment/Plan Principal Problem:   Acute encephalopathy Active Problems:   HTN (hypertension)   Diabetes mellitus type 2 in obese (HCC)    1. Acute encephalopathy with difficulty talking and generalized weakness - appreciate neurology consult and recommendations. MRI brain is negative for stroke. Neurology feels patient has possible conversion syndrome. Consult psychiatry in a.m. Will check ammonia levels EEG. Continue with hydration since lactic acid was mildly elevated. 2. Hypertension - will keep patient on when necessary IV hydralazine and scheduled dose of metoprolol until patient can swallow. 3. Diabetes mellitus type 2 - on sliding scale coverage for now. 4. History of gout - no signs of exacerbation.  Patient's daughter is going to bring her home medications since her medications as per the daughter has changed after the gastric sleeve surgery.   DVT prophylaxis: Lovenox. Code Status: Full code.  Family Communication: Patient's husband and daughter. Disposition Plan: To be determined.  Consults called: Neurology physical therapy and swallow evaluation.  Admission status: Observation.    Eduard Clos MD [facility] Pager [phone].  If 7PM-7AM, please contact night-coverage [email or web address] Password  [email or web address]  03/19/2016, 3:04 AM

## 2016-03-19 NOTE — Procedures (Signed)
ELECTROENCEPHALOGRAM REPORT  Date of Study: 03/19/2016  Patient's Name: Lacey BeltFrances Y XXXHerron MRN: [id number] Date of Birth: 03-05-1961  Referring Provider: Dr. Midge MiniumArshad Kakrakandy  Clinical History: This is a 56 year old woman with sudden onset speech arrest, unresponsive, then slumped over.   Medications: acetaminophen (TYLENOL) tablet 650 mg  enoxaparin (LOVENOX) injection 40 mg  hydrALAZINE (APRESOLINE) injection 10 mg  insulin aspart (novoLOG) injection 0-9 Units  lactulose (CHRONULAC) 10 GM/15ML solution 10 g  metoprolol (LOPRESSOR) injection 2.5 mg   Technical Summary: A multichannel digital EEG recording measured by the international 10-20 system with electrodes applied with paste and impedances below 5000 ohms performed in our laboratory with EKG monitoring in an awake and asleep patient.  Hyperventilation was not performed. Photic stimulation was performed.  The digital EEG was referentially recorded, reformatted, and digitally filtered in a variety of bipolar and referential montages for optimal display.    Description: The patient is awake and asleep during the recording.  During maximal wakefulness, there is a symmetric, medium voltage 11 Hz posterior dominant rhythm that attenuates with eye opening.  There is occasional focal 4-5 Hz theta slowing over the left temporal region. During drowsiness and sleep, there is an increase in theta slowing of the background.  Vertex waves and symmetric sleep spindles were seen.  Photic stimulation did not elicit any abnormalities.  There were no epileptiform discharges or electrographic seizures seen.    EKG lead was unremarkable.  Impression: This awake and asleep EEG is abnormal due to occasional focal slowing over the left temporal region.  Clinical Correlation of the above findings indicates focal cerebral dysfunction over the left temporal region suggestive of underlying structural or physiologic abnormality. The absence of  epileptiform discharges does not exclude a clinical diagnosis of epilepsy. Clinical correlation is advised.   Patrcia DollyKaren Aquino, M.D.

## 2016-03-19 NOTE — ED Notes (Signed)
Patient arrives via EMS from home. Reportedly tonight around 1900 patient was with family when she began to make unusual statements. Then patient was incontinent of stool x2 which family assisted patient with cleaning. Afterward patient made one more statement, began to laugh, and became mute. Family noted patient to be slumped over and apparently weak. Was responsive on arrival, but barely following commands.

## 2016-03-19 NOTE — Progress Notes (Signed)
Pt admitted from ED with c/o of stroke like symptoms, pt oriented to self only, disoriented to place and situation, settled in bed with family and call light at bedside, was however reassured and will continue to monitor, tele monitor put and verified on pt. Obasogie-Asidi, Philbert Ocallaghan Efe

## 2016-03-19 NOTE — Discharge Instructions (Signed)
Follow with Philis Fendt, MD in 5-7 days  Please get a complete blood count and chemistry panel checked by your Primary MD at your next visit, and again as instructed by your Primary MD. Please get your medications reviewed and adjusted by your Primary MD.  Please request your Primary MD to go over all Hospital Tests and Procedure/Radiological results at the follow up, please get all Hospital records sent to your Prim MD by signing hospital release before you go home.  If you had Pneumonia of Lung problems at the Hospital: Please get a 2 view Chest X ray done in 6-8 weeks after hospital discharge or sooner if instructed by your Primary MD.  If you have Congestive Heart Failure: Please call your Cardiologist or Primary MD anytime you have any of the following symptoms:  1) 3 pound weight gain in 24 hours or 5 pounds in 1 week  2) shortness of breath, with or without a dry hacking cough  3) swelling in the hands, feet or stomach  4) if you have to sleep on extra pillows at night in order to breathe  Follow cardiac low salt diet and 1.5 lit/day fluid restriction.  If you have diabetes Accuchecks 4 times/day, Once in AM empty stomach and then before each meal. Log in all results and show them to your primary doctor at your next visit. If any glucose reading is under 80 or above 300 call your primary MD immediately.  If you have Seizure/Convulsions/Epilepsy: Please do not drive, operate heavy machinery, participate in activities at heights or participate in high speed sports until you have seen by Primary MD or a Neurologist and advised to do so again.  If you had Gastrointestinal Bleeding: Please ask your Primary MD to check a complete blood count within one week of discharge or at your next visit. Your endoscopic/colonoscopic biopsies that are pending at the time of discharge, will also need to followed by your Primary MD.  Get Medicines reviewed and adjusted. Please take all your  medications with you for your next visit with your Primary MD  Please request your Primary MD to go over all hospital tests and procedure/radiological results at the follow up, please ask your Primary MD to get all Hospital records sent to his/her office.  If you experience worsening of your admission symptoms, develop shortness of breath, life threatening emergency, suicidal or homicidal thoughts you must seek medical attention immediately by calling 911 or calling your MD immediately  if symptoms less severe.  You must read complete instructions/literature along with all the possible adverse reactions/side effects for all the Medicines you take and that have been prescribed to you. Take any new Medicines after you have completely understood and accpet all the possible adverse reactions/side effects.   Do not drive or operate heavy machinery when taking Pain medications.   Do not take more than prescribed Pain, Sleep and Anxiety Medications  Special Instructions: If you have smoked or chewed Tobacco  in the last 2 yrs please stop smoking, stop any regular Alcohol  and or any Recreational drug use.  Wear Seat belts while driving.  Please note You were cared for by a hospitalist during your hospital stay. If you have any questions about your discharge medications or the care you received while you were in the hospital after you are discharged, you can call the unit and asked to speak with the hospitalist on call if the hospitalist that took care of you is not available.  Once you are discharged, your primary care physician will handle any further medical issues. Please note that NO REFILLS for any discharge medications will be authorized once you are discharged, as it is imperative that you return to your primary care physician (or establish a relationship with a primary care physician if you do not have one) for your aftercare needs so that they can reassess your need for medications and monitor your  lab values. ° °You can reach the hospitalist office at phone [phone] or fax [phone] °  °If you do not have a primary care physician, you can call [phone] for a physician referral. ° °Activity: As tolerated with Full fall precautions use walker/cane & assistance as needed ° ° ° °

## 2016-03-19 NOTE — Care Management Obs Status (Signed)
MEDICARE OBSERVATION STATUS NOTIFICATION   Patient Details  Name: Lacey Meyer MRN: [id number] Date of Birth: 03/02/1961   Medicare Observation Status Notification Given:  Yes (MRI negative)    Kermit BaloKelli F Chava Dulac, RN 03/19/2016, 12:00 PM

## 2016-03-19 NOTE — Care Management Note (Signed)
Case Management Note  Patient Details  Name: Lacey BeltFrances Y XXXHerron MRN: [id number] Date of Birth: 04/26/1960  Subjective/Objective:   Pt in with acute encephalopathy. She is from home with family and has been a recent caregiver to her sister.                  Action/Plan: Awaiting PT/OT recommendations. CM following for d/c needs.   Expected Discharge Date:                  Expected Discharge Plan:  Home/Self Care  In-House Referral:     Discharge planning Services     Post Acute Care Choice:    Choice offered to:     DME Arranged:    DME Agency:     HH Arranged:    HH Agency:     Status of Service:  In process, will continue to follow  If discussed at Long Length of Stay Meetings, dates discussed:    Additional Comments:  Kermit BaloKelli F Kalley Nicholl, RN 03/19/2016, 2:38 PM

## 2016-03-19 NOTE — Progress Notes (Signed)
EEG completed, results pending. 

## 2016-03-19 NOTE — Evaluation (Signed)
Clinical/Bedside Swallow Evaluation Patient Details  Name: Lacey Meyer MRN: [id number] Date of Birth: 06/02/1960  Today's Date: 03/19/2016 Time: SLP Start Time (ACUTE ONLY): [id number] SLP Stop Time (ACUTE ONLY): 0936 SLP Time Calculation (min) (ACUTE ONLY): 20 min  Past Medical History:  Past Medical History:  Diagnosis Date  . Anemia   . Arthritis   . Diabetes mellitus without complication (HCC)   . GERD (gastroesophageal reflux disease)   . Gout   . History of blood transfusion   . HTN (hypertension)   . Impaired vision    Past Surgical History:  Past Surgical History:  Procedure Laterality Date  . BTL    . EYE SURGERY     cataract surgery / right eye   . FOOT SURGERY     bilateral   . LAPAROSCOPIC GASTRIC SLEEVE RESECTION N/A 01/03/2016   Procedure: LAPAROSCOPIC GASTRIC SLEEVE RESECTION WITH UPPER ENDO;  Surgeon: Ovidio Kinavid Newman, MD;  Location: [facility];  Service: General;  Laterality: N/A;  . SHOULDER ARTHROSCOPY W/ ROTATOR CUFF REPAIR     left   . TUBAL LIGATION     HPI:  Lacey Meyer a 56 y.o.femalewith history of hypertension, diabetes mellitus type 2, gout who had recent gastric sleeve surgery for obesity in October 2017 admitted with confusion and had difficulty speaking. MRI of the brain is negative for stroke. Chest x-ray were unremarkable for any infectious process.    Assessment / Plan / Recommendation Clinical Impression  Oral and pharyngeal phases are normal across consistencies. Pt not confused this morning and states she is back to her baseline status. Endentulous due to poor fit (weight loss with recent gastric sleeve). Recommend regular texture diet, thin liquids, pills full with thin liquid. No follow up ST needed.      Aspiration Risk  Mild aspiration risk    Diet Recommendation Regular;Thin liquid   Liquid Administration via: Cup;No straw (d/t gastric sleeve surgery) Medication Administration: Whole meds with liquid Supervision: Patient able  to self feed Postural Changes: Seated upright at 90 degrees    Other  Recommendations Oral Care Recommendations: Oral care BID   Follow up Recommendations None      Frequency and Duration            Prognosis        Swallow Study   General HPI: Lacey Meyer a 56 y.o.femalewith history of hypertension, diabetes mellitus type 2, gout who had recent gastric sleeve surgery for obesity in October 2017 admitted with confusion and had difficulty speaking. MRI of the brain is negative for stroke. Chest x-ray were unremarkable for any infectious process.  Type of Study: Bedside Swallow Evaluation Previous Swallow Assessment:  (none) Diet Prior to this Study: NPO Temperature Spikes Noted: No Respiratory Status: Room air History of Recent Intubation: No Behavior/Cognition: Alert;Cooperative;Pleasant mood Oral Cavity Assessment: Within Functional Limits Oral Care Completed by SLP: No Oral Cavity - Dentition: Edentulous Vision:  (has baseline visual disturbance) Self-Feeding Abilities: Able to feed self Patient Positioning: Upright in bed Baseline Vocal Quality: Normal Volitional Cough: Strong Volitional Swallow: Able to elicit    Oral/Motor/Sensory Function Overall Oral Motor/Sensory Function: Within functional limits   Ice Chips Ice chips: Not tested   Thin Liquid Thin Liquid: Within functional limits Presentation: Cup (cannot use straw d/t recent gastric bypass)    Nectar Thick Nectar Thick Liquid: Not tested   Honey Thick Honey Thick Liquid: Not tested   Puree Puree: Within functional limits   Solid   GO  Solid: Within functional limits    Functional Assessment Tool Used: skilled clinical judgement Functional Limitations: Swallowing Swallow Current Status (Z6109): 0 percent impaired, limited or restricted Swallow Goal Status (U0454): 0 percent impaired, limited or restricted Swallow Discharge Status [phone]): 0 percent impaired, limited or restricted    Royce Macadamia 03/19/2016,10:15 AM  Breck Coons Lonell Face.Ed [employer] [phone]

## 2016-03-29 ENCOUNTER — Ambulatory Visit: Payer: Self-pay | Admitting: Dietician

## 2016-04-16 ENCOUNTER — Encounter: Payer: Self-pay | Admitting: Dietician

## 2016-04-16 ENCOUNTER — Encounter: Payer: Medicare Other | Attending: Surgery | Admitting: Dietician

## 2016-04-16 DIAGNOSIS — Z713 Dietary counseling and surveillance: Secondary | ICD-10-CM | POA: Insufficient documentation

## 2016-04-16 DIAGNOSIS — E119 Type 2 diabetes mellitus without complications: Secondary | ICD-10-CM | POA: Insufficient documentation

## 2016-04-16 NOTE — Progress Notes (Signed)
  Follow-up visit:  3.5 months Post-Operative Sleeve gastrectomy Surgery  Medical Nutrition Therapy:  Appt start time: 255 end time:  400  Primary concerns today: Post-operative Bariatric Surgery Nutrition Management. Lacey Meyer returns with her husband having lost another 3 pounds of fat. She reports that she is extremely stressed and discouraged. She has been caring for her sister who is being followed by hospice but only wants Lacey Meyer to care for her. Lacey Meyer reports that she had not been eating, taking her vitamins, or resting adequately. She was hospitalized a month ago after becoming unresponsive and loss of bowel control; patient states she was negative for stroke, "my body shut down." Dentures no longer fit well due to weight loss and she is unable to chew tough meats. Trying to get approved for dental implants.    Taking insulin as needed.   Surgery date: 01/03/2016 Surgery type: Sleeve gastrectomy [name] lbs on 11/10/2014, 268 lbs on 12/12/2015 Weight today: 244.8 lbs Weight change: 3 lbs fat loss Total weight lost: 23.2 lbs  TANITA  BODY COMP RESULTS  12/12/15 01/18/16 02/27/16 04/16/16   BMI (kg/m^2) 43.3 39.9 39.5 39.9   Fat Mass (lbs) 136.4 129.2 119.8 117   Fat Free Mass (lbs) 131.6 118.2 125 130.4   Total Body Water (lbs) 96.6 86.4 91.2 94.8    Preferred Learning Style:   No preference indicated   Learning Readiness:   Ready  24-hr recall: B (AM): vitamins crushed up in sugar free jello Snk (AM):  L (11-12PM): collard greens, 1/2 oz meatloaf, pinto beans OR [employer] chili D (3-4PM): less than 1 oz fish (breading peeled off) Snk (PM): sugar free jello  Fluid intake: water, protein shake, apple juice for low blood sugars (patient unsure of total amount of fluid) Estimated total protein intake: ~60 grams  Medications: see list Supplementation: taking   CBG monitoring: several times throughout the day Average CBG per patient: 70-130 mg/dL Last  patient reported A1c: n/a  Drinking while eating: no Hair loss: did not assess Carbonated beverages: none N/V/D/C: vomiting occasionally when she eats too much; regular bowel movements  Dumping syndrome: yes with fruit  Recent physical activity:  Minimal organized activity  Progress Towards Goal(s):  In progress.  Handouts given during visit include:  none   Nutritional Diagnosis:  [place]-3.3 Overweight/obesity related to past poor dietary habits and physical inactivity as evidenced by patient w/ recent sleeve gastrectomy surgery following dietary guidelines for continued weight loss.     Intervention:  Nutrition counseling provided.  Try to eat protein foods throughout the day: protein shake, tunafish, beans, 2% cheese  Keep taking your vitamins!  Try adding protein shake to coffee  Teaching Method Utilized:  Visual Auditory Hands on  Barriers to learning/adherence to lifestyle change: none  Demonstrated degree of understanding via:  Teach Back   Monitoring/Evaluation:  Dietary intake, exercise, and body weight. Follow up in 2 months for 5 month post-op visit.

## 2016-04-16 NOTE — Patient Instructions (Addendum)
Goals:  Follow Phase 3B: High Protein + Non-Starchy Vegetables  Eat 3-6 small meals/snacks, every 3-5 hrs  Increase lean protein foods to meet 60g goal  Keep working on eating protein foods throughout the day  Eat protein before veggies  Increase fluid intake to 64oz +  Only drink apple juice when you absolutely have to!  Avoid drinking 15 minutes before, during and 30 minutes after eating  [facility]  Talk to your doctor about low blood sugars and medications  Try to eat protein foods throughout the day: protein shake, tunafish, beans, 2% cheese  Keep taking your vitamins!  Try adding protein shake to coffee  Nonscale victories: -Loose clothes -No insulin and good blood sugars! -Less blood pressure medicine -Less swelling -Breathing easier -Getting down on floor and getting back up  Surgery date: 01/03/2016 Surgery type: Sleeve gastrectomy [facility]: 250 lbs on 11/10/2014, 268 lbs on 12/12/2015 Weight today: 244.8 lbs Weight change: 2.4 lbs Total weight lost: 23.2 lbs  TANITA  BODY COMP RESULTS  12/12/15 01/18/16 02/27/16 04/16/16   BMI (kg/m^2) 43.3 39.9 39.5 39.9   Fat Mass (lbs) 136.4 129.2 119.8 117   Fat Free Mass (lbs) 131.6 118.2 125 130.4   Total Body Water (lbs) 96.6 86.4 91.2 94.8

## 2016-04-27 ENCOUNTER — Ambulatory Visit: Payer: Medicare Other | Admitting: Podiatry

## 2016-05-15 ENCOUNTER — Ambulatory Visit: Payer: Medicare Other | Admitting: Podiatry

## 2016-06-06 ENCOUNTER — Encounter: Payer: Self-pay | Admitting: Podiatry

## 2016-06-06 ENCOUNTER — Ambulatory Visit (INDEPENDENT_AMBULATORY_CARE_PROVIDER_SITE_OTHER): Payer: Medicare Other | Admitting: Podiatry

## 2016-06-06 DIAGNOSIS — M779 Enthesopathy, unspecified: Secondary | ICD-10-CM

## 2016-06-06 DIAGNOSIS — M79676 Pain in unspecified toe(s): Secondary | ICD-10-CM | POA: Diagnosis not present

## 2016-06-06 DIAGNOSIS — M21612 Bunion of left foot: Secondary | ICD-10-CM

## 2016-06-06 DIAGNOSIS — E1140 Type 2 diabetes mellitus with diabetic neuropathy, unspecified: Secondary | ICD-10-CM

## 2016-06-06 DIAGNOSIS — B351 Tinea unguium: Secondary | ICD-10-CM | POA: Diagnosis not present

## 2016-06-06 NOTE — Progress Notes (Signed)
Patient ID: Lacey Meyer, female   DOB: 09/05/1960, 56 y.o.   MRN: [id number] Complaint:  Visit Type: Patient returns to my office for continued preventative foot care services. Complaint: Patient states" my nails have grown long and thick and become painful to walk and wear shoes" Patient has been diagnosed with DM with no foot complications. The patient presents for preventative foot care services. No changes to ROS.  Patient says her left foot surgery performed years ago is very painful.  Podiatric Exam: Vascular: dorsalis pedis and posterior tibial pulses are palpable bilateral. Capillary return is immediate. Temperature gradient is WNL. Skin turgor WNL  Sensorium: Diminished  Semmes Weinstein monofilament test. Normal tactile sensation bilaterally. Nail Exam: Pt has thick disfigured discolored nails with subungual debris noted bilateral entire nail hallux through fifth toenails Ulcer Exam: There is no evidence of ulcer or pre-ulcerative changes or infection. Orthopedic Exam: Muscle tone and strength are WNL. No limitations in general ROM. No crepitus or effusions noted. Foot type and digits show no abnormalities. Bony prominences are unremarkable. Pain and swelling left foot especially at the level 1st MPJ left foot. Skin: No Porokeratosis. No infection or ulcers  Diagnosis:  Onychomycosis, , Pain in right toe, pain in left toes,  Post- op swelling left foot.  Treatment & Plan Procedures and Treatment: Consent by patient was obtained for treatment procedures. The patient understood the discussion of treatment and procedures well. All questions were answered thoroughly reviewed. Debridement of mycotic and hypertrophic toenails, 1 through 5 bilateral and clearing of subungual debris. No ulceration, no infection noted. Xrays taken reveal healing at the surgical sites and the implants seem to be intact.  Told to make an appointment with one of the surgeons. Return Visit-Office Procedure: Patient  instructed to return to the office for a follow up visit 3 months for continued evaluation and treatment.     Lacey Meyer DPM

## 2016-06-07 ENCOUNTER — Ambulatory Visit (INDEPENDENT_AMBULATORY_CARE_PROVIDER_SITE_OTHER): Payer: Medicare Other | Admitting: Podiatry

## 2016-06-07 ENCOUNTER — Ambulatory Visit (INDEPENDENT_AMBULATORY_CARE_PROVIDER_SITE_OTHER): Payer: Medicare Other

## 2016-06-07 DIAGNOSIS — M2012 Hallux valgus (acquired), left foot: Secondary | ICD-10-CM

## 2016-06-07 DIAGNOSIS — I639 Cerebral infarction, unspecified: Secondary | ICD-10-CM | POA: Diagnosis not present

## 2016-06-07 DIAGNOSIS — M778 Other enthesopathies, not elsewhere classified: Secondary | ICD-10-CM

## 2016-06-07 DIAGNOSIS — M7752 Other enthesopathy of left foot: Secondary | ICD-10-CM

## 2016-06-07 DIAGNOSIS — M779 Enthesopathy, unspecified: Secondary | ICD-10-CM

## 2016-06-07 DIAGNOSIS — M2032 Hallux varus (acquired), left foot: Secondary | ICD-10-CM | POA: Diagnosis not present

## 2016-06-07 NOTE — Progress Notes (Signed)
She presents today complaining of painful hallux and first metatarsal left foot. She is several years now status post bunion repair and Akin osteotomy to the left foot. She states that she is doing quite well with this toe itself is extremely painful. She goes on to say that she's lost a lot of weight her hemoglobin A1c is down near the 6 range. She states that she is doing very well at this point.  Objective: Vital signs are stable she is alert and oriented 3 pulses are palpable. Moderate edema to the bilateral lower extremity she has pain on palpation with a hallux malleus left overlying the hallux interphalangeal joint. Radiographs today demonstrate internal fixation proximal phalanx and a screw to the first metatarsal. She does have a hallux malleus which is painful.  Assessment: Hallux malleus and painful internal fixation.  Plan: I injected the hallux interphalangeal joint today with dexamethasone and local anesthetic. She was also consented for a hallux interphalangeal joint fusion and removal of internal fixation first metatarsal left. I answered all the questions regarding these procedures best my ability in layman's terms to her and her husband. At this point she is on another patient consent form and I will follow-up with her in the near future for surgical intervention. We also dispensed a cam walker today as she was provided with both oral and written home-going instructions for preop.

## 2016-06-07 NOTE — Patient Instructions (Signed)
Pre-Operative Instructions  Congratulations, you have decided to take an important step to improving your quality of life.  You can be assured that the doctors of [facility] will be with you every step of the way.  1. Plan to be at the surgery center/hospital at least 1 (one) hour prior to your scheduled time unless otherwise directed by the surgical center/hospital staff.  You must have a responsible adult accompany you, remain during the surgery and drive you home.  Make sure you have directions to the surgical center/hospital and know how to get there on time. 2. For hospital based surgery you will need to obtain a history and physical form from your family physician within 1 month prior to the date of surgery- we will give you a form for you primary physician.  3. We make every effort to accommodate the date you request for surgery.  There are however, times where surgery dates or times have to be moved.  We will contact you as soon as possible if a change in schedule is required.   4. No Aspirin/Ibuprofen for one week before surgery.  If you are on aspirin, any non-steroidal anti-inflammatory medications (Mobic, Aleve, Ibuprofen) you should stop taking it 7 days prior to your surgery.  You make take Tylenol  For pain prior to surgery.  5. Medications- If you are taking daily heart and blood pressure medications, seizure, reflux, allergy, asthma, anxiety, pain or diabetes medications, make sure the surgery center/hospital is aware before the day of surgery so they may notify you which medications to take or avoid the day of surgery. 6. No food or drink after midnight the night before surgery unless directed otherwise by surgical center/hospital staff. 7. No alcoholic beverages 24 hours prior to surgery.  No smoking 24 hours prior to or 24 hours after surgery. 8. Wear loose pants or shorts- loose enough to fit over bandages, boots, and casts. 9. No slip on shoes, sneakers are best. 10. Bring  your boot with you to the surgery center/hospital.  Also bring crutches or a walker if your physician has prescribed it for you.  If you do not have this equipment, it will be provided for you after surgery. 11. If you have not been contracted by the surgery center/hospital by the day before your surgery, call to confirm the date and time of your surgery. 12. Leave-time from work may vary depending on the type of surgery you have.  Appropriate arrangements should be made prior to surgery with your employer. 13. Prescriptions will be provided immediately following surgery by your doctor.  Have these filled as soon as possible after surgery and take the medication as directed. 14. Remove nail polish on the operative foot. 15. Wash the night before surgery.  The night before surgery wash the foot and leg well with the antibacterial soap provided and water paying special attention to beneath the toenails and in between the toes.  Rinse thoroughly with water and dry well with a towel.  Perform this wash unless told not to do so by your physician.  Enclosed: 1 Ice pack (please put in freezer the night before surgery)   1 Hibiclens skin cleaner   Pre-op Instructions  If you have any questions regarding the instructions, do not hesitate to call our office.  [place]: [address] [place], [place] [address] [phone]  [place]: [address] , [place] [address] [phone]  [place]: [address]  [place], [place] [address] [phone]   Dr.   Norman Regal DPM, Dr. Matthew Wagoner DPM, Dr. M. Todd Dominque Marlin DPM, Dr. Titorya Stover DPM 

## 2016-06-13 ENCOUNTER — Telehealth: Payer: Self-pay | Admitting: *Deleted

## 2016-06-13 NOTE — Telephone Encounter (Signed)
Lacey Meyer [facility] states he needs the paper work for pt's surgical medical clearance faxed to [phone].

## 2016-06-13 NOTE — Telephone Encounter (Signed)
"  Calling regarding Lacey Meyer.  She came to [place] stating Dr. Al Corpus saw her and has requested medical clearance.  We did not receive that request.  Can you fax it to [place] at [phone]."

## 2016-06-14 ENCOUNTER — Encounter: Payer: Self-pay | Admitting: *Deleted

## 2016-06-14 NOTE — Telephone Encounter (Signed)
I faxed medical clearance letter to Dr. Concepcion Elk.

## 2016-06-15 NOTE — Telephone Encounter (Signed)
We received medical clearance from Dr. Concepcion Elk.

## 2016-06-16 ENCOUNTER — Encounter (HOSPITAL_COMMUNITY): Payer: Self-pay | Admitting: Emergency Medicine

## 2016-06-16 ENCOUNTER — Observation Stay (HOSPITAL_COMMUNITY)
Admission: EM | Admit: 2016-06-16 | Discharge: 2016-06-17 | Disposition: A | Discharge status: Home / Self Care | Payer: Medicare Other | Attending: Internal Medicine | Admitting: Internal Medicine

## 2016-06-16 ENCOUNTER — Emergency Department (HOSPITAL_COMMUNITY): Payer: Medicare Other

## 2016-06-16 DIAGNOSIS — Z7984 Long term (current) use of oral hypoglycemic drugs: Secondary | ICD-10-CM | POA: Insufficient documentation

## 2016-06-16 DIAGNOSIS — Z79899 Other long term (current) drug therapy: Secondary | ICD-10-CM | POA: Insufficient documentation

## 2016-06-16 DIAGNOSIS — Z87891 Personal history of nicotine dependence: Secondary | ICD-10-CM | POA: Diagnosis not present

## 2016-06-16 DIAGNOSIS — I10 Essential (primary) hypertension: Secondary | ICD-10-CM | POA: Diagnosis present

## 2016-06-16 DIAGNOSIS — E669 Obesity, unspecified: Secondary | ICD-10-CM | POA: Diagnosis not present

## 2016-06-16 DIAGNOSIS — R05 Cough: Secondary | ICD-10-CM | POA: Diagnosis present

## 2016-06-16 DIAGNOSIS — A419 Sepsis, unspecified organism: Secondary | ICD-10-CM

## 2016-06-16 DIAGNOSIS — J189 Pneumonia, unspecified organism: Secondary | ICD-10-CM | POA: Diagnosis present

## 2016-06-16 DIAGNOSIS — E1169 Type 2 diabetes mellitus with other specified complication: Secondary | ICD-10-CM | POA: Diagnosis not present

## 2016-06-16 DIAGNOSIS — M109 Gout, unspecified: Secondary | ICD-10-CM | POA: Diagnosis present

## 2016-06-16 DIAGNOSIS — M1A9XX0 Chronic gout, unspecified, without tophus (tophi): Secondary | ICD-10-CM | POA: Diagnosis not present

## 2016-06-16 DIAGNOSIS — E119 Type 2 diabetes mellitus without complications: Secondary | ICD-10-CM | POA: Insufficient documentation

## 2016-06-16 DIAGNOSIS — E872 Acidosis, unspecified: Secondary | ICD-10-CM

## 2016-06-16 DIAGNOSIS — J181 Lobar pneumonia, unspecified organism: Secondary | ICD-10-CM | POA: Diagnosis not present

## 2016-06-16 LAB — LIPASE, BLOOD: LIPASE: 22 U/L (ref 11–51)

## 2016-06-16 LAB — COMPREHENSIVE METABOLIC PANEL
ALT: 22 U/L (ref 14–54)
AST: 24 U/L (ref 15–41)
Albumin: 3.8 g/dL (ref 3.5–5.0)
Alkaline Phosphatase: 71 U/L (ref 38–126)
Anion gap: 8 (ref 5–15)
BUN: 14 mg/dL (ref 6–20)
CHLORIDE: 105 mmol/L (ref 101–111)
CO2: 26 mmol/L (ref 22–32)
CREATININE: 0.8 mg/dL (ref 0.44–1.00)
Calcium: 9.2 mg/dL (ref 8.9–10.3)
Glucose, Bld: 184 mg/dL — ABNORMAL HIGH (ref 65–99)
POTASSIUM: 4.2 mmol/L (ref 3.5–5.1)
SODIUM: 139 mmol/L (ref 135–145)
Total Bilirubin: 0.8 mg/dL (ref 0.3–1.2)
Total Protein: 7.8 g/dL (ref 6.5–8.1)

## 2016-06-16 LAB — CBC WITH DIFFERENTIAL/PLATELET
BASOS ABS: 0 10*3/uL (ref 0.0–0.1)
Basophils Relative: 0 %
EOS ABS: 0.2 10*3/uL (ref 0.0–0.7)
Eosinophils Relative: 2 %
HCT: 40.3 % (ref 36.0–46.0)
Hemoglobin: 13.5 g/dL (ref 12.0–15.0)
LYMPHS ABS: 2.4 10*3/uL (ref 0.7–4.0)
LYMPHS PCT: 18 %
MCH: 27.7 pg (ref 26.0–34.0)
MCHC: 33.5 g/dL (ref 30.0–36.0)
MCV: 82.6 fL (ref 78.0–100.0)
MONOS PCT: 5 %
Monocytes Absolute: 0.7 10*3/uL (ref 0.1–1.0)
NEUTROS ABS: 10.4 10*3/uL — AB (ref 1.7–7.7)
NEUTROS PCT: 75 %
Platelets: 177 10*3/uL (ref 150–400)
RBC: 4.88 MIL/uL (ref 3.87–5.11)
RDW: 14.9 % (ref 11.5–15.5)
WBC: 13.8 10*3/uL — AB (ref 4.0–10.5)

## 2016-06-16 LAB — URINALYSIS, ROUTINE W REFLEX MICROSCOPIC
Bilirubin Urine: NEGATIVE
Glucose, UA: NEGATIVE mg/dL
Hgb urine dipstick: NEGATIVE
Ketones, ur: NEGATIVE mg/dL
Leukocytes, UA: NEGATIVE
NITRITE: NEGATIVE
PH: 7 (ref 5.0–8.0)
Protein, ur: NEGATIVE mg/dL
SPECIFIC GRAVITY, URINE: 1.014 (ref 1.005–1.030)

## 2016-06-16 LAB — STREP PNEUMONIAE URINARY ANTIGEN: Strep Pneumo Urinary Antigen: NEGATIVE

## 2016-06-16 LAB — I-STAT CG4 LACTIC ACID, ED: LACTIC ACID, VENOUS: 2.85 mmol/L — AB (ref 0.5–1.9)

## 2016-06-16 LAB — GLUCOSE, CAPILLARY
GLUCOSE-CAPILLARY: 163 mg/dL — AB (ref 65–99)
Glucose-Capillary: 127 mg/dL — ABNORMAL HIGH (ref 65–99)

## 2016-06-16 LAB — LACTIC ACID, PLASMA: Lactic Acid, Venous: 2.4 mmol/L (ref 0.5–1.9)

## 2016-06-16 LAB — INFLUENZA PANEL BY PCR (TYPE A & B)
Influenza A By PCR: NEGATIVE
Influenza B By PCR: NEGATIVE

## 2016-06-16 LAB — I-STAT TROPONIN, ED: Troponin i, poc: 0 ng/mL (ref 0.00–0.08)

## 2016-06-16 MED ORDER — SODIUM CHLORIDE 0.9 % IV BOLUS (SEPSIS)
1000.0000 mL | Freq: Once | INTRAVENOUS | Status: AC
  Administered 2016-06-16: 1000 mL via INTRAVENOUS

## 2016-06-16 MED ORDER — DULOXETINE HCL 30 MG PO CPEP: 60.0000 mg | ORAL_CAPSULE | Freq: Every day | ORAL | Status: DC

## 2016-06-16 MED ORDER — INSULIN ASPART 100 UNIT/ML ~~LOC~~ SOLN: 0.0000 [IU] | Freq: Every day | SUBCUTANEOUS | Status: DC

## 2016-06-16 MED ORDER — DEXTROSE 5 % IV SOLN
1.0000 g | Freq: Once | INTRAVENOUS | Status: AC
  Administered 2016-06-16: 1 g via INTRAVENOUS
  Filled 2016-06-16: qty 10

## 2016-06-16 MED ORDER — HYDROCOD POLST-CPM POLST ER 10-8 MG/5ML PO SUER
5.0000 mL | Freq: Two times a day (BID) | ORAL | Status: DC
  Administered 2016-06-16 – 2016-06-17 (×3): 5 mL via ORAL
  Filled 2016-06-16 (×3): qty 5

## 2016-06-16 MED ORDER — AZITHROMYCIN 250 MG PO TABS: 500.0000 mg | ORAL_TABLET | ORAL | Status: DC

## 2016-06-16 MED ORDER — BENAZEPRIL HCL 10 MG PO TABS
10.0000 mg | ORAL_TABLET | Freq: Every day | ORAL | Status: DC
Start: 2016-06-17 — End: 2016-06-17
  Administered 2016-06-17: 10 mg via ORAL
  Filled 2016-06-16: qty 1

## 2016-06-16 MED ORDER — DEXTROSE 5 % IV SOLN
500.0000 mg | Freq: Once | INTRAVENOUS | Status: AC
  Administered 2016-06-16: 500 mg via INTRAVENOUS
  Filled 2016-06-16: qty 500

## 2016-06-16 MED ORDER — ALLOPURINOL 300 MG PO TABS
300.0000 mg | ORAL_TABLET | Freq: Every day | ORAL | Status: DC
  Administered 2016-06-16 – 2016-06-17 (×2): 300 mg via ORAL
  Filled 2016-06-16 (×2): qty 1

## 2016-06-16 MED ORDER — ENOXAPARIN SODIUM 60 MG/0.6ML ~~LOC~~ SOLN
50.0000 mg | SUBCUTANEOUS | Status: DC
  Administered 2016-06-16: 50 mg via SUBCUTANEOUS
  Filled 2016-06-16: qty 0.6

## 2016-06-16 MED ORDER — IBUPROFEN 800 MG PO TABS: 800.0000 mg | ORAL_TABLET | ORAL | Status: DC | PRN

## 2016-06-16 MED ORDER — SODIUM CHLORIDE 0.9 % IV BOLUS (SEPSIS)
250.0000 mL | Freq: Once | INTRAVENOUS | Status: AC
  Administered 2016-06-16: 250 mL via INTRAVENOUS

## 2016-06-16 MED ORDER — DEXTROSE 5 % IV SOLN: 1.0000 g | INTRAVENOUS | Status: DC

## 2016-06-16 MED ORDER — INSULIN ASPART 100 UNIT/ML ~~LOC~~ SOLN
0.0000 [IU] | Freq: Three times a day (TID) | SUBCUTANEOUS | Status: DC
  Administered 2016-06-16: 5 [IU] via SUBCUTANEOUS
  Administered 2016-06-17 (×2): 3 [IU] via SUBCUTANEOUS

## 2016-06-16 MED ORDER — DEXTROSE 5 % IV SOLN
1.0000 g | INTRAVENOUS | Status: DC
  Administered 2016-06-17: 1 g via INTRAVENOUS
  Filled 2016-06-16: qty 10

## 2016-06-16 MED ORDER — AZITHROMYCIN 500 MG PO TABS
500.0000 mg | ORAL_TABLET | ORAL | Status: DC
  Administered 2016-06-16: 500 mg via ORAL
  Filled 2016-06-16: qty 1

## 2016-06-16 MED ORDER — PREGABALIN 50 MG PO CAPS
200.0000 mg | ORAL_CAPSULE | Freq: Three times a day (TID) | ORAL | Status: DC
  Administered 2016-06-16 – 2016-06-17 (×3): 200 mg via ORAL
  Filled 2016-06-16 (×3): qty 4

## 2016-06-16 MED ORDER — ONDANSETRON HCL 4 MG/2ML IJ SOLN
4.0000 mg | Freq: Once | INTRAMUSCULAR | Status: AC
  Administered 2016-06-16: 4 mg via INTRAVENOUS
  Filled 2016-06-16: qty 2

## 2016-06-16 MED ORDER — PANTOPRAZOLE SODIUM 40 MG PO TBEC: 40.0000 mg | DELAYED_RELEASE_TABLET | Freq: Every day | ORAL | Status: DC | PRN

## 2016-06-16 MED ORDER — KETOROLAC TROMETHAMINE 15 MG/ML IJ SOLN
15.0000 mg | Freq: Once | INTRAMUSCULAR | Status: AC
  Administered 2016-06-16: 15 mg via INTRAVENOUS
  Filled 2016-06-16: qty 1

## 2016-06-16 MED ORDER — METOPROLOL SUCCINATE ER 50 MG PO TB24
50.0000 mg | ORAL_TABLET | Freq: Every day | ORAL | Status: DC
  Administered 2016-06-17: 50 mg via ORAL
  Filled 2016-06-16: qty 1

## 2016-06-16 MED ORDER — OXYCODONE HCL 5 MG PO TABS
10.0000 mg | ORAL_TABLET | Freq: Three times a day (TID) | ORAL | Status: DC | PRN
  Administered 2016-06-16 – 2016-06-17 (×2): 10 mg via ORAL
  Filled 2016-06-16 (×2): qty 2

## 2016-06-16 MED ORDER — SIMVASTATIN 20 MG PO TABS
20.0000 mg | ORAL_TABLET | Freq: Every day | ORAL | Status: DC
  Administered 2016-06-16 – 2016-06-17 (×2): 20 mg via ORAL
  Filled 2016-06-16 (×2): qty 1

## 2016-06-16 MED ORDER — ALPRAZOLAM 0.25 MG PO TABS: 0.2500 mg | ORAL_TABLET | Freq: Every day | ORAL | Status: DC | PRN

## 2016-06-16 NOTE — ED Notes (Signed)
Report given to KIM RN 1 west

## 2016-06-16 NOTE — Progress Notes (Signed)
CRITICAL VALUE ALERT  Critical value received: Lactic Acid 2.4 (Lab value trending down from previous result)  Date of notification:  06/16/16   Time of notification:  1525  Critical value read back: yes  Nurse who received alert:  K. Raymar Joiner  MD notified (1st page):  Dr. Butler Denmark-- via text page   Time of first page:  1551  MD notified (2nd page):  Time of second page:  Responding MD:    Time MD responded:

## 2016-06-16 NOTE — ED Provider Notes (Signed)
[facility]-EMERGENCY DEPT Provider Note   CSN: [id number] Arrival date & time: 06/16/16  1032     History   Chief Complaint Chief Complaint  Patient presents with  . Flu Like Symptoms    HPI Lacey Meyer is a 56 y.o. female.  HPI   56 year old female with a history of diabetes, hypertension presents with concern for cough, shortness of breath, congestion, nausea and vomiting, body aches and fever beginning this morning. Cough is productive of clear and yellow sputum. He also reports nasal discharge with yellow and clear mucus. Reports chest pain with cough, and associated dyspnea. Has had a proximally 5 episodes of emesis. Reports severe diffuse myalgias. No known sick contacts.  Past Medical History:  Diagnosis Date  . Anemia   . Arthritis   . Diabetes mellitus without complication (HCC)   . GERD (gastroesophageal reflux disease)   . Gout   . History of blood transfusion   . HTN (hypertension)   . Impaired vision     Patient Active Problem List   Diagnosis Date Noted  . Acute encephalopathy 03/19/2016  . Diabetes mellitus type 2 in obese (HCC) 03/19/2016  . Altered mental status 03/18/2016  . Morbid obesity (HCC) 01/03/2016  . 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 Pain in limb 12/23/2012  . Arthritis of foot 12/23/2012  . Obesity, Class II, BMI 35-39.9, with comorbidity 06/25/2011  . HTN (hypertension)   . Impaired vision   . Gout   . Anemia   . Nuclear sclerotic cataract 02/20/2011  . Pseudoaphakia 02/20/2011  . 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 Acute loss of vision 12/15/2010    Past Surgical History:  Procedure Laterality Date  . BTL    . EYE SURGERY     cataract surgery / right eye   . FOOT SURGERY     bilateral   . LAPAROSCOPIC GASTRIC SLEEVE RESECTION N/A 01/03/2016   Procedure: LAPAROSCOPIC GASTRIC SLEEVE RESECTION WITH UPPER ENDO;  Surgeon: Ovidio Kin, MD;  Location: [facility]S;  Service: General;   Laterality: N/A;  . SHOULDER ARTHROSCOPY W/ ROTATOR CUFF REPAIR     left   . TUBAL LIGATION      OB History    No data available       Home Medications    Prior to Admission medications   Medication Sig Start Date End Date Taking? Authorizing Provider  allopurinol (ZYLOPRIM) 300 MG tablet Take 1 tablet (300 mg total) by mouth daily. 10/03/11  Yes Elvina Sidle, MD  ALPRAZolam Prudy Feeler) 0.25 MG tablet Take 0.25 mg by mouth daily as needed for anxiety. 05/28/16  Yes Historical Provider, MD  benazepril (LOTENSIN) 10 MG tablet Take 10 mg by mouth daily. 05/09/16  Yes Historical Provider, MD  DULoxetine (CYMBALTA) 60 MG capsule Take 60 mg by mouth daily as needed for anxiety. 03/23/16  Yes Historical Provider, MD  furosemide (LASIX) 40 MG tablet Take 40 mg by mouth daily.  04/02/15  Yes Historical Provider, MD  glimepiride (AMARYL) 4 MG tablet Take 4 mg by mouth daily. 10/16/15  Yes Historical Provider, MD  metFORMIN (GLUCOPHAGE) 500 MG tablet Take 500 mg by mouth 2 (two) times daily.  02/28/15  Yes Historical Provider, MD  metoprolol succinate (TOPROL-XL) 50 MG 24 hr tablet Take 50 mg by mouth daily.  12/28/15  Yes Historical Provider, MD  Multiple Vitamins-Minerals (BARIATRIC FUSION PO) Take 1 tablet by mouth daily.   Yes Historical Provider, MD  Rubbie Battiest  FLEXPEN 100 UNIT/ML FlexPen Inject 2-4 Units into the skin daily as needed. 04/23/16  Yes Historical Provider, MD  Oxycodone HCl 10 MG TABS Take 10 mg by mouth every 8 (eight) hours as needed. 02/29/16  Yes Historical Provider, MD  pantoprazole (PROTONIX) 40 MG tablet Take 40 mg by mouth daily as needed (indigestion).  04/24/15  Yes Historical Provider, MD  simvastatin (ZOCOR) 40 MG tablet Take 20 mg by mouth daily. 05/21/16  Yes Historical Provider, MD  LYRICA 200 MG capsule Take 200 mg by mouth 3 (three) times daily.  08/04/15   Historical Provider, MD    Family History Family History  Problem Relation Age of Onset  . Hypertension Mother   .  Hypertension Father     Social History Social History  Substance Use Topics  . Smoking status: Former Smoker    Packs/day: 0.25    Years: 1.00    Types: Cigarettes    Quit date: 03/06/2010  . Smokeless tobacco: Never Used  . Alcohol use No     Allergies   Ampicillin; Doxycycline; and Other   Review of Systems Review of Systems  Constitutional: Positive for chills and fever.  HENT: Positive for congestion and rhinorrhea. Negative for sore throat.   Eyes: Negative for visual disturbance.  Respiratory: Positive for cough and shortness of breath.   Cardiovascular: Negative for chest pain.  Gastrointestinal: Positive for abdominal pain (mild epigastric pain), nausea and vomiting.  Genitourinary: Negative for difficulty urinating, dysuria and flank pain.  Musculoskeletal: Positive for myalgias. Negative for back pain and neck pain.  Skin: Negative for rash.  Neurological: Negative for syncope and headaches.     Physical Exam Updated Vital Signs BP (!) 175/84 (BP Location: Left Arm)   Pulse (!) 111   Temp (!) 100.6 F (38.1 C) (Oral)   Resp (!) 22   Ht  (1.727 m)   Wt 237 lb (107.5 kg)   SpO2 99%   BMI 36.04 kg/m   Physical Exam  Constitutional: She is oriented to person, place, and time. She appears well-developed and well-nourished. She appears ill. No distress.  HENT:  Head: Normocephalic and atraumatic.  Mouth/Throat: Oropharynx is clear and moist. No oropharyngeal exudate.  Normal TM bilaterally  Eyes: Conjunctivae and EOM are normal.  Neck: Normal range of motion.  Cardiovascular: Regular rhythm, normal heart sounds and intact distal pulses.  Tachycardia present.  Exam reveals no gallop and no friction rub.   No murmur heard. Pulmonary/Chest: Effort normal and breath sounds normal. No respiratory distress. She has no wheezes. She has no rales.  Abdominal: Soft. She exhibits no distension. There is no tenderness. There is no guarding.  Musculoskeletal:  She exhibits no edema or tenderness.  Neurological: She is alert and oriented to person, place, and time.  Skin: Skin is warm and dry. No rash noted. She is not diaphoretic. No erythema.  Nursing note and vitals reviewed.    ED Treatments / Results  Labs (all labs ordered are listed, but only abnormal results are displayed) Labs Reviewed  COMPREHENSIVE METABOLIC PANEL - Abnormal; Notable for the following:       Result Value   Glucose, Bld 184 (*)    All other components within normal limits  CBC WITH DIFFERENTIAL/PLATELET - Abnormal; Notable for the following:    WBC 13.8 (*)    Neutro Abs 10.4 (*)    All other components within normal limits  I-STAT CG4 LACTIC ACID, ED - Abnormal; Notable for the  following:    Lactic Acid, Venous 2.85 (*)    All other components within normal limits  CULTURE, BLOOD (ROUTINE X 2)  CULTURE, BLOOD (ROUTINE X 2)  INFLUENZA PANEL BY PCR (TYPE A & B)  URINALYSIS, ROUTINE W REFLEX MICROSCOPIC  LIPASE, BLOOD  I-STAT TROPOININ, ED    EKG  EKG Interpretation  Date/Time:  Saturday June 16 2016 11:25:06 EDT Ventricular Rate:  101 PR Interval:    QRS Duration: 89 QT Interval:  333 QTC Calculation: 432 R Axis:   63 Text Interpretation:  Sinus tachycardia No significant change since last tracing Confirmed by [facility] MD, Dailon Sheeran ([id number]) on 06/16/2016 12:32:50 PM       Radiology Dg Chest 2 View  Result Date: 06/16/2016 CLINICAL DATA:  Cough. EXAM: CHEST  2 VIEW COMPARISON:  March 18, 2016 FINDINGS: No pneumothorax. The cardiomediastinal silhouette is stable. Suspected infiltrate in the left base best seen on the frontal view. No other acute abnormalities identified. IMPRESSION: Left basilar infiltrate.  Recommend follow-up to resolution. Electronically Signed   By: Gerome Sam III M.D   On: 06/16/2016 11:02    Procedures Procedures (including critical care time)  Medications Ordered in ED Medications  azithromycin (ZITHROMAX) 500 mg in  dextrose 5 % 250 mL IVPB (500 mg Intravenous New Bag/Given 06/16/16 1218)  sodium chloride 0.9 % bolus 1,000 mL (not administered)  sodium chloride 0.9 % bolus 250 mL (not administered)  sodium chloride 0.9 % bolus 1,000 mL (1,000 mLs Intravenous New Bag/Given 06/16/16 1126)  sodium chloride 0.9 % bolus 1,000 mL (1,000 mLs Intravenous New Bag/Given 06/16/16 1133)  ondansetron (ZOFRAN) injection 4 mg (4 mg Intravenous Given 06/16/16 1132)  ketorolac (TORADOL) 15 MG/ML injection 15 mg (15 mg Intravenous Given 06/16/16 1132)  cefTRIAXone (ROCEPHIN) 1 g in dextrose 5 % 50 mL IVPB (0 g Intravenous Stopped 06/16/16 1218)     Initial Impression / Assessment and Plan / ED Course  I have reviewed the triage vital signs and the nursing notes.  Pertinent labs & imaging results that were available during my care of the patient were reviewed by me and considered in my medical decision making (see chart for details).    56 year old female with a history of hypertension and diabetes presents with concern for cough congestion, body aches, nausea and vomiting as well as shortness of breath. Chest x-ray shows left-sided pneumonia. Lactic acid elevated to 2.85. Patient was given 30 mL/kg of normal saline, Rocephin and azithromycin for community-acquired pneumonia. Influenza testing is negative. Given patient's lactic acid, tachycardia, will admit for further care pneumonia.  Final Clinical Impressions(s) / ED Diagnoses   Final diagnoses:  Community acquired pneumonia of left lower lobe of lung (HCC)  Lactic acidosis    New Prescriptions New Prescriptions   No medications on file     Alvira Monday, MD 06/16/16 1243

## 2016-06-16 NOTE — ED Notes (Signed)
Family at bedside. 

## 2016-06-16 NOTE — ED Notes (Signed)
Pt cannot use restroom at this time, aware urine specimen is needed.  

## 2016-06-16 NOTE — ED Triage Notes (Signed)
Pt verbalizes awoke at 0800 with flu like symptoms. Pt complaint of generalized body aches, fever, n/v, and productive cough.

## 2016-06-16 NOTE — ED Notes (Signed)
ED Provider at bedside. 

## 2016-06-16 NOTE — H&P (Signed)
History and Physical    Lacey Meyer ZOX:[id number] DOB: Feb 16, 1961 DOA: 06/16/2016    PCP: Dorrene German, MD  Patient coming from: home  Chief Complaint: cough, pain  HPI: Lacey Meyer is a 56 y.o. female with medical history of DM 2, HTN, Gastric sleeve, Gout presens with a cough, pain in the chest and nasal drainage. She vomited on Wednesday and coughed a bit but improved. Yesterday she began to cough again and is coughing up yellow sputum. She has had large amounts of clear nasal discharge. This morning she woke up and felt pain across her rib cage worse with coughing. She was incontinent of stool and then felt feverish and decided to come to the ER. Currently she is complaining of pain across her lower ribs.   ED Course:  Temp 1001.6, HR in low 100s, Lactic acid 2.85, WBC 13.8 Influenza negative CXR > LLL infiltrate Given Rocephin, Zithromax, 3.5 L of IVF  Review of Systems:  Diarrhea today with incontinence. Poor appetite.  Pain in feet and legs from neuropathy Swelling of feet L > R- has been told a metallic implant in her toe needs to be removed.   occasional heartburn All other systems reviewed and apart from HPI, are negative.  Past Medical History:  Diagnosis Date  . Anemia   . Arthritis   . Diabetes mellitus without complication (HCC)   . GERD (gastroesophageal reflux disease)   . Gout   . History of blood transfusion   . HTN (hypertension)   . Impaired vision     Past Surgical History:  Procedure Laterality Date  . BTL    . EYE SURGERY     cataract surgery / right eye   . FOOT SURGERY     bilateral   . LAPAROSCOPIC GASTRIC SLEEVE RESECTION N/A 01/03/2016   Procedure: LAPAROSCOPIC GASTRIC SLEEVE RESECTION WITH UPPER ENDO;  Surgeon: Ovidio Kin, MD;  Location: [facility] OR[facility];  Service: General;  Laterality: N/A;  . SHOULDER ARTHROSCOPY W/ ROTATOR CUFF REPAIR     left   . TUBAL LIGATION      Social History:   reports that she quit smoking about 6 years  ago. Her smoking use included Cigarettes. She has a 0.25 pack-year smoking history. She has never used smokeless tobacco. She reports that she does not drink alcohol or use drugs.  Lives with daughter. On disability.   Allergies  Allergen Reactions  . Ampicillin Anaphylaxis, Hives and Other (See Comments)    Has patient had a PCN reaction causing immediate rash, facial/tongue/throat swelling, SOB or lightheadedness with hypotension: Yes Has patient had a PCN reaction causing severe rash involving mucus membranes or skin necrosis: Yes Has patient had a PCN reaction that required hospitalization Yes Has patient had a PCN reaction occurring within the last 10 years: No If all of the above answers are "NO", then may proceed with Cephalosporin use.   Marland Kitchen Doxycycline Other (See Comments)    unknown  . Other Swelling, Rash and Other (See Comments)    Blistex    Family History  Problem Relation Age of Onset  . Hypertension Mother   . Hypertension Father      Prior to Admission medications   Medication Sig Start Date End Date Taking? Authorizing Provider  allopurinol (ZYLOPRIM) 300 MG tablet Take 1 tablet (300 mg total) by mouth daily. 10/03/11  Yes Elvina Sidle, MD  ALPRAZolam Prudy Feeler) 0.25 MG tablet Take 0.25 mg by mouth daily as needed for anxiety.  05/28/16  Yes Historical Provider, MD  benazepril (LOTENSIN) 10 MG tablet Take 10 mg by mouth daily. 05/09/16  Yes Historical Provider, MD  DULoxetine (CYMBALTA) 60 MG capsule Take 60 mg by mouth daily as needed for anxiety. 03/23/16  Yes Historical Provider, MD  furosemide (LASIX) 40 MG tablet Take 40 mg by mouth daily.  04/02/15  Yes Historical Provider, MD  glimepiride (AMARYL) 4 MG tablet Take 4 mg by mouth daily. 10/16/15  Yes Historical Provider, MD  metFORMIN (GLUCOPHAGE) 500 MG tablet Take 500 mg by mouth 2 (two) times daily.  02/28/15  Yes Historical Provider, MD  metoprolol succinate (TOPROL-XL) 50 MG 24 hr tablet Take 50 mg by mouth daily.   12/28/15  Yes Historical Provider, MD  Multiple Vitamins-Minerals (BARIATRIC FUSION PO) Take 1 tablet by mouth daily.   Yes Historical Provider, MD  NOVOLOG FLEXPEN 100 UNIT/ML FlexPen Inject 2-4 Units into the skin daily as needed. 04/23/16  Yes Historical Provider, MD  Oxycodone HCl 10 MG TABS Take 10 mg by mouth every 8 (eight) hours as needed. 02/29/16  Yes Historical Provider, MD  pantoprazole (PROTONIX) 40 MG tablet Take 40 mg by mouth daily as needed (indigestion).  04/24/15  Yes Historical Provider, MD  simvastatin (ZOCOR) 40 MG tablet Take 20 mg by mouth daily. 05/21/16  Yes Historical Provider, MD  LYRICA 200 MG capsule Take 200 mg by mouth 3 (three) times daily.  08/04/15   Historical Provider, MD    Physical Exam: Vitals:   06/16/16 1043 06/16/16 1247  BP: (!) 175/84 (!) 120/53  Pulse: (!) 111 92  Resp: (!) 22 (!) 24  Temp: (!) 100.6 F (38.1 C)   TempSrc: Oral   SpO2: 99% 97%  Weight: 107.5 kg (237 lb)   Height:  (1.727 m)       Constitutional: NAD, calm, comfortable Eyes: PERTLA, lids and conjunctivae normal ENMT: Mucous membranes are moist. Posterior pharynx clear of any exudate or lesions. Normal dentition.  Neck: normal, supple, no masses, no thyromegaly Respiratory: clear to auscultation bilaterally, no wheezing, no crackles. Normal respiratory effort. No accessory muscle use.  Cardiovascular: S1 & S2 heard, regular rate and rhythm, no murmurs / rubs / gallops. No extremity edema. 2+ pedal pulses. No carotid bruits.  Abdomen: No distension, no tenderness, no masses palpated. No hepatosplenomegaly. Bowel sounds normal.  Musculoskeletal: no clubbing / cyanosis. No joint deformity upper and lower extremities. Good ROM, no contractures. Normal muscle tone.  Skin: no rashes, lesions, ulcers. No induration Neurologic: CN 2-12 grossly intact. Sensation intact, DTR normal. Strength 5/5 in all 4 limbs.  Psychiatric: Normal judgment and insight. Alert and oriented x 3.  Normal mood.     Labs on Admission: I have personally reviewed following labs and imaging studies  CBC:  Recent Labs Lab 06/16/16 1102  WBC 13.8*  NEUTROABS 10.4*  HGB 13.5  HCT 40.3  MCV 82.6  PLT 177   Basic Metabolic Panel:  Recent Labs Lab 06/16/16 1102  NA 139  K 4.2  CL 105  CO2 26  GLUCOSE 184*  BUN 14  CREATININE 0.80  CALCIUM 9.2   GFR: Estimated Creatinine Clearance: 99.6 mL/min (by C-G formula based on SCr of 0.8 mg/dL). Liver Function Tests:  Recent Labs Lab 06/16/16 1102  AST 24  ALT 22  ALKPHOS 71  BILITOT 0.8  PROT 7.8  ALBUMIN 3.8   No results for input(s): LIPASE, AMYLASE in the last 168 hours. No results for input(s): AMMONIA in  the last 168 hours. Coagulation Profile: No results for input(s): INR, PROTIME in the last 168 hours. Cardiac Enzymes: No results for input(s): CKTOTAL, CKMB, CKMBINDEX, TROPONINI in the last 168 hours. BNP (last 3 results) No results for input(s): PROBNP in the last 8760 hours. HbA1C: No results for input(s): HGBA1C in the last 72 hours. CBG: No results for input(s): GLUCAP in the last 168 hours. Lipid Profile: No results for input(s): CHOL, HDL, LDLCALC, TRIG, CHOLHDL, LDLDIRECT in the last 72 hours. Thyroid Function Tests: No results for input(s): TSH, T4TOTAL, FREET4, T3FREE, THYROIDAB in the last 72 hours. Anemia Panel: No results for input(s): VITAMINB12, FOLATE, FERRITIN, TIBC, IRON, RETICCTPCT in the last 72 hours. Urine analysis:    Component Value Date/Time   COLORURINE YELLOW 03/18/2016 2307   APPEARANCEUR HAZY (A) 03/18/2016 2307   LABSPEC 1.020 03/18/2016 2307   PHURINE 5.0 03/18/2016 2307   GLUCOSEU NEGATIVE 03/18/2016 2307   HGBUR NEGATIVE 03/18/2016 2307   BILIRUBINUR NEGATIVE 03/18/2016 2307   KETONESUR NEGATIVE 03/18/2016 2307   PROTEINUR NEGATIVE 03/18/2016 2307   UROBILINOGEN 0.2 08/05/2011 1614   NITRITE POSITIVE (A) 03/18/2016 2307   LEUKOCYTESUR NEGATIVE 03/18/2016 2307    Sepsis Labs: (procalcitonin:4,lacticidven:4) )No results found for this or any previous visit (from the past 240 hour(s)).   Radiological Exams on Admission: Dg Chest 2 View  Result Date: 06/16/2016 CLINICAL DATA:  Cough. EXAM: CHEST  2 VIEW COMPARISON:  March 18, 2016 FINDINGS: No pneumothorax. The cardiomediastinal silhouette is stable. Suspected infiltrate in the left base best seen on the frontal view. No other acute abnormalities identified. IMPRESSION: Left basilar infiltrate.  Recommend follow-up to resolution. Electronically Signed   By: Gerome Sam III M.D   On: 06/16/2016 11:02    EKG: Independently reviewed. Sinus tach at 101 bpm  Assessment/Plan Active Problems:   Left lower lobe pneumonia with severe sepsis- fever, tachycardia, leukocytosis, lactic acidosis  - noted on Xray- is symptomatic - Influenza negative - Rocephin, Zithromax - given IVF as mentioned above- hold off on further fluids - Tussionex, Mucinex, Ibuprofen - Strep U antigen, HIV  DM2 - SSI, hold oral meds for now  HTN - Hold Lasix - cont Metoprolol & Benazepril  Neuropathy - Lyrica, Oxycodone  Anxiety - Xanax, was not taking Cymbalta appropriately (PRN)- have told her to either take it day or stop it  GERD s/p gastrc sleeve - Protonix  Gout - Allopurinol  Obesity Body mass index is 36.04 kg/m.   DVT prophylaxis: Lovenox  Code Status: Full code  Family Communication:   Disposition Plan: med/surg  Consults called: none  Admission status: observation    Calvert Cantor MD [facility] Pager: [email or web address] Password [email or web address] 7PM-7AM, please contact night-coverage   06/16/2016, 1:00 PM

## 2016-06-16 NOTE — ED Notes (Signed)
Attempted to call report nurse not availabel

## 2016-06-17 DIAGNOSIS — E872 Acidosis, unspecified: Secondary | ICD-10-CM

## 2016-06-17 DIAGNOSIS — I10 Essential (primary) hypertension: Secondary | ICD-10-CM | POA: Diagnosis not present

## 2016-06-17 DIAGNOSIS — J181 Lobar pneumonia, unspecified organism: Secondary | ICD-10-CM

## 2016-06-17 DIAGNOSIS — J189 Pneumonia, unspecified organism: Secondary | ICD-10-CM

## 2016-06-17 DIAGNOSIS — M1A9XX0 Chronic gout, unspecified, without tophus (tophi): Secondary | ICD-10-CM | POA: Diagnosis not present

## 2016-06-17 DIAGNOSIS — E669 Obesity, unspecified: Secondary | ICD-10-CM | POA: Diagnosis not present

## 2016-06-17 DIAGNOSIS — A419 Sepsis, unspecified organism: Secondary | ICD-10-CM | POA: Diagnosis not present

## 2016-06-17 DIAGNOSIS — E1169 Type 2 diabetes mellitus with other specified complication: Secondary | ICD-10-CM | POA: Diagnosis not present

## 2016-06-17 LAB — BASIC METABOLIC PANEL
ANION GAP: 6 (ref 5–15)
BUN: 16 mg/dL (ref 6–20)
CALCIUM: 8.4 mg/dL — AB (ref 8.9–10.3)
CHLORIDE: 108 mmol/L (ref 101–111)
CO2: 26 mmol/L (ref 22–32)
Creatinine, Ser: 0.79 mg/dL (ref 0.44–1.00)
GFR calc non Af Amer: >60 mL/min (ref >60)
Glucose, Bld: 189 mg/dL — ABNORMAL HIGH (ref 65–99)
Potassium: 4 mmol/L (ref 3.5–5.1)
SODIUM: 140 mmol/L (ref 135–145)

## 2016-06-17 LAB — CBC
HCT: 35.6 % — ABNORMAL LOW (ref 36.0–46.0)
HEMOGLOBIN: 11.7 g/dL — AB (ref 12.0–15.0)
MCH: 27.4 pg (ref 26.0–34.0)
MCHC: 32.9 g/dL (ref 30.0–36.0)
MCV: 83.4 fL (ref 78.0–100.0)
Platelets: 166 10*3/uL (ref 150–400)
RBC: 4.27 MIL/uL (ref 3.87–5.11)
RDW: 15.3 % (ref 11.5–15.5)
WBC: 8.5 10*3/uL (ref 4.0–10.5)

## 2016-06-17 LAB — GLUCOSE, CAPILLARY
GLUCOSE-CAPILLARY: 167 mg/dL — AB (ref 65–99)
Glucose-Capillary: 172 mg/dL — ABNORMAL HIGH (ref 65–99)

## 2016-06-17 LAB — LACTIC ACID, PLASMA: LACTIC ACID, VENOUS: 1.9 mmol/L (ref 0.5–1.9)

## 2016-06-17 LAB — HIV ANTIBODY (ROUTINE TESTING W REFLEX): HIV Screen 4th Generation wRfx: NONREACTIVE

## 2016-06-17 MED ORDER — PANTOPRAZOLE SODIUM 40 MG PO TBEC: 40.0000 mg | DELAYED_RELEASE_TABLET | Freq: Every day | ORAL | Status: DC

## 2016-06-17 MED ORDER — AZITHROMYCIN 250 MG PO TABS: 250.0000 mg | ORAL_TABLET | ORAL | 0 refills | Status: DC

## 2016-06-17 MED ORDER — HYDROCOD POLST-CPM POLST ER 10-8 MG/5ML PO SUER: 5.0000 mL | Freq: Two times a day (BID) | ORAL | 0 refills | Status: DC

## 2016-06-17 MED ORDER — SODIUM CHLORIDE 0.9 % IV SOLN
Freq: Once | INTRAVENOUS | Status: AC
  Administered 2016-06-17: 08:00:00 via INTRAVENOUS

## 2016-06-17 MED ORDER — ENOXAPARIN SODIUM 60 MG/0.6ML ~~LOC~~ SOLN: 0.5000 mg/kg | SUBCUTANEOUS | Status: DC

## 2016-06-17 MED ORDER — FUROSEMIDE 40 MG PO TABS: 40.0000 mg | ORAL_TABLET | Freq: Every day | ORAL | Status: AC

## 2016-06-17 MED ORDER — LEVOFLOXACIN 750 MG PO TABS: 750.0000 mg | ORAL_TABLET | Freq: Every day | ORAL | 0 refills | Status: AC

## 2016-06-17 NOTE — Progress Notes (Signed)
Patient discharged to home

## 2016-06-17 NOTE — Progress Notes (Signed)
Discharge instructions reviewed with patient utilizing teach back method no questions at this time. Patient will be discharged when ride available.

## 2016-06-17 NOTE — Discharge Summary (Signed)
Physician Discharge Summary  Lacey Meyer:[id number] DOB: 1960-05-08 DOA: 06/16/2016  PCP: Dorrene German, MD  Admit date: 06/16/2016 Discharge date: 06/17/2016  Time spent: 35 minutes  Recommendations for Outpatient Follow-up:  1. Repeat BMET to follow electrolytes and renal function  2. Follow CBG and adjust hypoglycemic regimen as needed 3. Please repeat CXR in 4 weeks to follow resolution of infiltrates   Discharge Diagnoses:  Principal Problem:   Left lower lobe pneumonia (HCC) Active Problems:   HTN (hypertension)   Gout   Morbid obesity (HCC)   Diabetes mellitus type 2 in obese (HCC)   Sepsis (HCC)   Community acquired pneumonia of left lower lobe of lung (HCC)   Lactic acidosis   Discharge Condition: stable and improved. Discharge home with instructions to follow up with PCP in 2 weeks.  Diet recommendation: heart healthy, low calorie and modified carbohydrates diet.  Filed Weights   06/16/16 1043 06/16/16 1423  Weight: 107.5 kg (237 lb) 119.1 kg (262 lb 9.1 oz)    History of present illness:  As per H&P written by Dr. Butler Denmark on 06/16/16 Lacey Meyer with medical history of DM 2, HTN, Gastric sleeve, Gout presens with a cough, pain in the chest and nasal drainage. She vomited on Wednesday and coughed a bit but improved. Yesterday she began to cough again and is coughing up yellow sputum. She has had large amounts of clear nasal discharge. This morning she woke up and felt pain across her rib cage worse with coughing. She was incontinent of stool and then felt feverish and decided to come to the ER. Currently she is complaining of pain across her lower ribs.   Hospital Course:  1-sepsis due to PNA -sepsis features resolved -patient with good O2 sat, no longer tachycardic, afebrile and with WBC's WNL -no nausea or vomiting -will discharge on PO antibiotics to complete abx's therapy. -repeat x-ray in 4 weeks to assure resolution of infiltrates  2-type 2  diabetes -resume oral hypoglycemic regimen and insulin -patient advise to follow low carb diet  3-HTN -BP stable -advise to follow heart healthy diet -will resume antihypertensive regimen   4-diabetic neuropathy -will continue lyrica, cymbalta and PRN oxycodone as previously prescribed  5-anxiety -resume home medication regimen -stable mood currently  6-GERD -will continue PPI  7-gout: w/o acute flare -will continue allopurinol  8-morbid obesity -Body mass index is 39.92 kg/m. -patient expressed bariatric surgery done on 12/2015 -losing weight appropriately and keeping follow up with surgeon  -encourage to increase physical activity and follow protion/calorie control as well    Procedures:  See below for x-ray reports   Consultations:  None   Discharge Exam: Vitals:   06/16/16 2138 06/17/16 0612  BP: (!) 128/59 125/74  Pulse: 82 75  Resp: 20 20  Temp: 97.6 F (36.4 C) 97.8 F (36.6 C)    General: afebrile, feeling much better and breathing a lot easier. Patient with good O2 sat on RA. Cardiovascular: S1 and S2, no rubs, no gallops, no murmurs Respiratory: good air movement, no wheezing, no crackles; positive scattered rhonchi appreciated abd: soft, NT, ND, positive BS Extremities: trace edema bilaterally, no cyanosis    Discharge Instructions   Discharge Instructions    Diet - low sodium heart healthy    Complete by:  As directed    Diet Carb Modified    Complete by:  As directed    Discharge instructions    Complete by:  As directed  Take medications as prescribed Keep yourself well hydrated  Arrange follow up with PCP in 2 weeks  Follow heart healthy, low calorie diet and modified carbohydrates     Current Discharge Medication List    START taking these medications   Details  azithromycin (ZITHROMAX) 250 MG tablet Take 1 tablet (250 mg total) by mouth daily. Qty: 4 tablet, Refills: 0    chlorpheniramine-HYDROcodone (TUSSIONEX) 10-8  MG/5ML SUER Take 5 mLs by mouth every 12 (twelve) hours. Qty: 140 mL, Refills: 0    levofloxacin (LEVAQUIN) 750 MG tablet Take 1 tablet (750 mg total) by mouth daily. Qty: 7 tablet, Refills: 0      CONTINUE these medications which have CHANGED   Details  furosemide (LASIX) 40 MG tablet Take 1 tablet (40 mg total) by mouth daily.    pantoprazole (PROTONIX) 40 MG tablet Take 1 tablet (40 mg total) by mouth daily.      CONTINUE these medications which have NOT CHANGED   Details  allopurinol (ZYLOPRIM) 300 MG tablet Take 1 tablet (300 mg total) by mouth daily. Qty: 30 tablet, Refills: 5   Associated Diagnoses: Gout    ALPRAZolam (XANAX) 0.25 MG tablet Take 0.25 mg by mouth daily as needed for anxiety.    benazepril (LOTENSIN) 10 MG tablet Take 10 mg by mouth daily.    DULoxetine (CYMBALTA) 60 MG capsule Take 60 mg by mouth daily as needed for anxiety.    glimepiride (AMARYL) 4 MG tablet Take 4 mg by mouth daily.    metFORMIN (GLUCOPHAGE) 500 MG tablet Take 500 mg by mouth 2 (two) times daily.     metoprolol succinate (TOPROL-XL) 50 MG 24 hr tablet Take 50 mg by mouth daily.     Multiple Vitamins-Minerals (BARIATRIC FUSION PO) Take 1 tablet by mouth daily.    NOVOLOG FLEXPEN 100 UNIT/ML FlexPen Inject 2-4 Units into the skin daily as needed.    Oxycodone HCl 10 MG TABS Take 10 mg by mouth every 8 (eight) hours as needed.    simvastatin (ZOCOR) 40 MG tablet Take 20 mg by mouth daily.    LYRICA 200 MG capsule Take 200 mg by mouth 3 (three) times daily.        Allergies  Allergen Reactions  . Ampicillin Anaphylaxis, Hives and Other (See Comments)    Has patient had a PCN reaction causing immediate rash, facial/tongue/throat swelling, SOB or lightheadedness with hypotension: Yes Has patient had a PCN reaction causing severe rash involving mucus membranes or skin necrosis: Yes Has patient had a PCN reaction that required hospitalization Yes Has patient had a PCN reaction  occurring within the last 10 years: No If all of the above answers are "NO", then may proceed with Cephalosporin use.   Marland Kitchen Doxycycline Other (See Comments)    unknown  . Other Swelling, Rash and Other (See Comments)    Blistex   Follow-up Information    Dorrene German, MD. Schedule an appointment as soon as possible for a visit in 2 week(s).   Specialty:  Internal Medicine Contact information: [address] [place] [address] [phone]           The results of significant diagnostics from this hospitalization (including imaging, microbiology, ancillary and laboratory) are listed below for reference.    Significant Diagnostic Studies: Dg Chest 2 View  Result Date: 06/16/2016 CLINICAL DATA:  Cough. EXAM: CHEST  2 VIEW COMPARISON:  March 18, 2016 FINDINGS: No pneumothorax. The cardiomediastinal silhouette is stable. Suspected infiltrate  in the left base best seen on the frontal view. No other acute abnormalities identified. IMPRESSION: Left basilar infiltrate.  Recommend follow-up to resolution. Electronically Signed   By: Gerome Sam III M.D   On: 06/16/2016 11:02   Dg Foot Complete Left  Result Date: 06/07/2016 Please see detailed radiograph report in office note.   Microbiology: No results found for this or any previous visit (from the past 240 hour(s)).   Labs: Basic Metabolic Panel:  Recent Labs Lab 06/16/16 1102 06/17/16 0638  NA 139 140  K 4.2 4.0  CL 105 108  CO2 26 26  GLUCOSE 184* 189*  BUN 14 16  CREATININE 0.80 0.79  CALCIUM 9.2 8.4*   Liver Function Tests:  Recent Labs Lab 06/16/16 1102  AST 24  ALT 22  ALKPHOS 71  BILITOT 0.8  PROT 7.8  ALBUMIN 3.8    Recent Labs Lab 06/16/16 1115  LIPASE 22   CBC:  Recent Labs Lab 06/16/16 1102 06/17/16 0638  WBC 13.8* 8.5  NEUTROABS 10.4*  --   HGB 13.5 11.7*  HCT 40.3 35.6*  MCV 82.6 83.4  PLT 177 166   CBG:  Recent Labs Lab 06/16/16 1425 06/16/16 2136 06/17/16 0721   GLUCAP 127* 163* 172*    Signed:  Vassie Loll MD.  [facility] 06/17/2016, 8:53 AM

## 2016-06-18 LAB — GLUCOSE, CAPILLARY: Glucose-Capillary: 206 mg/dL — ABNORMAL HIGH (ref 65–99)

## 2016-06-19 LAB — BLOOD CULTURE ID PANEL (REFLEXED)
Acinetobacter baumannii: NOT DETECTED
CANDIDA GLABRATA: NOT DETECTED
CANDIDA KRUSEI: NOT DETECTED
CANDIDA PARAPSILOSIS: NOT DETECTED
Candida albicans: NOT DETECTED
Candida tropicalis: NOT DETECTED
ENTEROBACTER CLOACAE COMPLEX: NOT DETECTED
ENTEROCOCCUS SPECIES: NOT DETECTED
ESCHERICHIA COLI: NOT DETECTED
Enterobacteriaceae species: NOT DETECTED
Haemophilus influenzae: NOT DETECTED
KLEBSIELLA PNEUMONIAE: NOT DETECTED
Klebsiella oxytoca: NOT DETECTED
Listeria monocytogenes: NOT DETECTED
NEISSERIA MENINGITIDIS: NOT DETECTED
Proteus species: NOT DETECTED
Pseudomonas aeruginosa: NOT DETECTED
STAPHYLOCOCCUS SPECIES: NOT DETECTED
Serratia marcescens: NOT DETECTED
Staphylococcus aureus (BCID): NOT DETECTED
Streptococcus agalactiae: NOT DETECTED
Streptococcus pneumoniae: NOT DETECTED
Streptococcus pyogenes: NOT DETECTED
Streptococcus species: NOT DETECTED

## 2016-06-21 LAB — CULTURE, BLOOD (ROUTINE X 2)
CULTURE: NO GROWTH
Special Requests: ADEQUATE

## 2016-06-23 LAB — CULTURE, BLOOD (ROUTINE X 2): SPECIAL REQUESTS: ADEQUATE

## 2016-07-25 ENCOUNTER — Encounter: Payer: Medicare Other | Attending: Surgery | Admitting: Skilled Nursing Facility1

## 2016-07-25 ENCOUNTER — Encounter: Payer: Self-pay | Admitting: Skilled Nursing Facility1

## 2016-07-25 DIAGNOSIS — Z6839 Body mass index (BMI) 39.0-39.9, adult: Secondary | ICD-10-CM | POA: Insufficient documentation

## 2016-07-25 DIAGNOSIS — Z713 Dietary counseling and surveillance: Secondary | ICD-10-CM | POA: Insufficient documentation

## 2016-07-25 DIAGNOSIS — E119 Type 2 diabetes mellitus without complications: Secondary | ICD-10-CM

## 2016-07-25 NOTE — Progress Notes (Signed)
  Follow-up visit:  3.5 months Post-Operative Sleeve gastrectomy Surgery  Medical Nutrition Therapy:  Appt start time: 255 end time:  400  Primary concerns today: Post-operative Bariatric Surgery Nutrition Management. Pt states her Sister passed away in march. Pt states her landlord is remodeling her apartment which is very stressful. Pt states she Cannot afford protein shakes or the multivitamin. Pt states she is not resting. Pt states she has Pain in her feet due to neuropathy, thus impacting her physical activity. Pt states her last soda was in September. Pt states she has experienced bulimic tendencies in the past, RD recommended she contact a mental health professional, pt was agreeable to this and stated she would make the call today. Pt is a poor historian however states she has been eating very little and has been experiencing diarrhea related to stress.    Surgery date: 01/03/2016 Surgery type: Sleeve gastrectomy [facility] lbs on 11/10/2014, 268 lbs on 12/12/2015  Weight today: 256.8 lb Weight change: 12 lb wt gain   TANITA  BODY COMP RESULTS  12/12/15 01/18/16 02/27/16 04/16/16 07/25/16   BMI (kg/m^2) 43.3 39.9 39.5 39.9 39.0   Fat Mass (lbs) 136.4 129.2 119.8 117 131.2   Fat Free Mass (lbs) 131.6 118.2 125 130.4 125.6   Total Body Water (lbs) 96.6 86.4 91.2 94.8 92    Preferred Learning Style:   No preference indicated   Learning Readiness:   Ready  24-hr recall: B (AM):  Snk (AM):  L (11-12PM): [employer] chili D (3-4PM): Snk (PM):   Fluid intake: water (84.5), apple juice (8oz) for low blood sugars (patient unsure of total amount of fluid) Estimated total protein intake:   Medications: see list Supplementation: not taking  CBG monitoring: several times throughout the day Average CBG per patient: 125, 128, 180-190's mg/dL Last patient reported Z6XA1c: 5.8  Drinking while eating: no Hair loss: did not assess Carbonated beverages: none N/V/D/C:  vomiting, diarrhea  Dumping syndrome: yes with fruit  Recent physical activity:  Minimal organized activity  Progress Towards Goal(s):  In progress.  Handouts given during visit include:  none   Nutritional Diagnosis:  [place]-3.3 Overweight/obesity related to past poor dietary habits and physical inactivity as evidenced by patient w/ recent sleeve gastrectomy surgery following dietary guidelines for continued weight loss.     Intervention:  Nutrition counseling provided. - For BELT: call [phone] or email at [email or web address]  - Call therapist and make appointment to see them before next RD appointment  Teaching Method Utilized:  Visual Auditory Hands on  Barriers to learning/adherence to lifestyle change: none  Demonstrated degree of understanding via:  Teach Back   Monitoring/Evaluation:  Dietary intake, exercise, and body weight. Follow up in 2 months for 5 month post-op visit.

## 2016-07-25 NOTE — Patient Instructions (Addendum)
-  For BELT: call [phone] or email at [email or web address]

## 2016-09-05 ENCOUNTER — Encounter: Payer: Medicare Other | Attending: Surgery | Admitting: Skilled Nursing Facility1

## 2016-09-05 ENCOUNTER — Encounter: Payer: Self-pay | Admitting: Skilled Nursing Facility1

## 2016-09-05 DIAGNOSIS — Z6839 Body mass index (BMI) 39.0-39.9, adult: Secondary | ICD-10-CM | POA: Insufficient documentation

## 2016-09-05 DIAGNOSIS — E669 Obesity, unspecified: Secondary | ICD-10-CM

## 2016-09-05 DIAGNOSIS — Z713 Dietary counseling and surveillance: Secondary | ICD-10-CM | POA: Insufficient documentation

## 2016-09-05 DIAGNOSIS — E1169 Type 2 diabetes mellitus with other specified complication: Secondary | ICD-10-CM

## 2016-09-05 NOTE — Patient Instructions (Addendum)
-  Chew your food until applesauce consistency   -Take bites the size of a dime or thumb nail  -Stay up right for at least an hour after you eat

## 2016-09-05 NOTE — Progress Notes (Signed)
Follow-up visit:  3.5 months Post-Operative Sleeve gastrectomy Surgery  Medical Nutrition Therapy:  Appt start time: 255 end time:  400  Primary concerns today: Post-operative Bariatric Surgery Nutrition Management. Pt states she did not call a therapist but is starting BELT. Pt states the remodel of her apartment is finished so she is feeling less stressed. Pt states in the last couple of weeks her knee has been bothering her. Pt states she throws up a lot of what she eats. Pt states she never had a swallow study or a sleep study before surgery.  Pt states she has had no appetite. Pt states 3 bites of food and she is full or sick. Pt states she is learning to chew better but not successful yet. Pt states she feels like her surgeon does not treat her right and treats her like dollar signs pt also states she does not like the dietitian and did not want to come to the appointment today. Pt is a poor historian and admits to not chewing well and not taking small enough bites as well as not sticking to the recommended diet.  Pt does not want to be weighed.   Surgery date: 01/03/2016 Surgery type: Sleeve gastrectomy [facility] 250 lbs on 11/10/2014, 268 lbs on 12/12/2015  Weight today: 256.8 lb Weight change: 12 lb wt gain   TANITA  BODY COMP RESULTS  12/12/15 01/18/16 02/27/16 04/16/16 07/25/16   BMI (kg/m^2) 43.3 39.9 39.5 39.9 39.0   Fat Mass (lbs) 136.4 129.2 119.8 117 131.2   Fat Free Mass (lbs) 131.6 118.2 125 130.4 125.6   Total Body Water (lbs) 96.6 86.4 91.2 94.8 92    Preferred Learning Style:   No preference indicated   Learning Readiness:   Ready  24-hr recall: B (AM): pork sausage  Snk (AM): half protein shake L (11-12PM): tuna sandwich                        Sugar free jello D (3-4PM): meat loaf and pinto beans-----chicken wings  Snk (PM):   Fluid intake: water (84.5), apple juice (8oz) for low blood sugars (patient unsure of total amount of fluid) or white peach  grape cranberry,  half a cup of coffee  Estimated total protein intake:   Medications: see list Supplementation: not taking  CBG monitoring: several times throughout the day (states her monitor may be inaccurate) Average CBG per patient: 135 or 199 mg/dL Last patient reported Z6XA1c: 5.8  Drinking while eating: no Hair loss: did not assess Carbonated beverages: none N/V/D/C: vomiting, diarrhea  Dumping syndrome: yes with fruit Having you been chewing well: no Chewing/swallowing difficulties:  no Changes in vision: no Changes to mood/headaches: no Hair loss/Cahnges to skin/Changes to nails: no Any difficulty focusing or concentrating: no Sweating: no Dizziness/Lightheaded: no Palpitations: no  Carbonated beverages: no  Recent physical activity:  Minimal organized activity  Progress Towards Goal(s):  In progress.  Handouts given during visit include:  none   Nutritional Diagnosis:  [place]-3.3 Overweight/obesity related to past poor dietary habits and physical inactivity as evidenced by patient w/ recent sleeve gastrectomy surgery following dietary guidelines for continued weight loss.     Intervention:  Nutrition counseling provided. Dietitian educated ht ept on her focusing on one small goal at once to avoid getting overwhelmed.  Goals: -Chew your food until applesauce consistency  -Take bites the size of a dime or thumb nail -Stay up right for at least an  hour after you eat Teaching Method Utilized:  Visual Auditory Hands on  Barriers to learning/adherence to lifestyle change: none  Demonstrated degree of understanding via:  Teach Back   Monitoring/Evaluation:  Dietary intake, exercise, and body weight. Follow up in 2 months for 5 month post-op visit.

## 2016-09-07 ENCOUNTER — Ambulatory Visit: Payer: Medicare Other | Admitting: Podiatry

## 2016-09-13 ENCOUNTER — Encounter: Payer: Self-pay | Admitting: Podiatry

## 2016-09-13 ENCOUNTER — Telehealth: Payer: Self-pay | Admitting: *Deleted

## 2016-09-13 ENCOUNTER — Ambulatory Visit (INDEPENDENT_AMBULATORY_CARE_PROVIDER_SITE_OTHER): Payer: Medicare Other | Admitting: Podiatry

## 2016-09-13 DIAGNOSIS — Z794 Long term (current) use of insulin: Secondary | ICD-10-CM

## 2016-09-13 DIAGNOSIS — B351 Tinea unguium: Secondary | ICD-10-CM

## 2016-09-13 DIAGNOSIS — E1140 Type 2 diabetes mellitus with diabetic neuropathy, unspecified: Secondary | ICD-10-CM

## 2016-09-13 DIAGNOSIS — M79676 Pain in unspecified toe(s): Secondary | ICD-10-CM | POA: Diagnosis not present

## 2016-09-13 NOTE — Telephone Encounter (Signed)
Patient was here today and requested I call her to schedule her surgery.    "I am calling as you requested.  Do you have a date in mind that you would like to schedule surgery?  "Yes, I don't want to do it on my birthday, that is [birth year]."  He can do it on August 10.  "That date will be fine.  They'll call me with the time right?"  Yes, that is correct.

## 2016-09-13 NOTE — Progress Notes (Signed)
Patient ID: Lacey BlowerFrances Y Meyer, female   DOB: 06/16/1960, 56 y.o.   MRN: [id number] Complaint:  Visit Type: Patient returns to my office for continued preventative foot care services. Complaint: Patient states" my nails have grown long and thick and become painful to walk and wear shoes" Patient has been diagnosed with DM with no foot complications. The patient presents for preventative foot care services. No changes to ROS.    Podiatric Exam: Vascular: dorsalis pedis and posterior tibial pulses are palpable bilateral. Capillary return is immediate. Temperature gradient is WNL. Skin turgor WNL  Sensorium: Diminished  Semmes Weinstein monofilament test. Normal tactile sensation bilaterally. Nail Exam: Pt has thick disfigured discolored nails with subungual debris noted bilateral entire nail hallux through fifth toenails Ulcer Exam: There is no evidence of ulcer or pre-ulcerative changes or infection. Orthopedic Exam: Muscle tone and strength are WNL. No limitations in general ROM. No crepitus or effusions noted. Foot type and digits show no abnormalities. Bony prominences are unremarkable. Pain and swelling left foot especially at the level 1st MPJ left foot. Skin: No Porokeratosis. No infection or ulcers  Diagnosis:  Onychomycosis, , Pain in right toe, pain in left toes,  Post- op swelling left foot.  Treatment & Plan Procedures and Treatment: Consent by patient was obtained for treatment procedures. The patient understood the discussion of treatment and procedures well. All questions were answered thoroughly reviewed. Debridement of mycotic and hypertrophic toenails, 1 through 5 bilateral and clearing of subungual debris. No ulceration, no infection noted.     Return Visit-Office Procedure: Patient instructed to return to the office for a follow up visit 3 months for continued evaluation and treatment.     Helane GuntherGregory Leverne Amrhein DPM

## 2016-10-10 ENCOUNTER — Encounter: Payer: Self-pay | Admitting: Skilled Nursing Facility1

## 2016-10-10 ENCOUNTER — Encounter: Payer: Medicare Other | Attending: Surgery | Admitting: Skilled Nursing Facility1

## 2016-10-10 DIAGNOSIS — E119 Type 2 diabetes mellitus without complications: Secondary | ICD-10-CM

## 2016-10-10 DIAGNOSIS — Z713 Dietary counseling and surveillance: Secondary | ICD-10-CM | POA: Diagnosis present

## 2016-10-10 DIAGNOSIS — Z6839 Body mass index (BMI) 39.0-39.9, adult: Secondary | ICD-10-CM | POA: Diagnosis not present
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
## 2016-10-17 ENCOUNTER — Other Ambulatory Visit: Payer: Self-pay | Admitting: Podiatry

## 2016-10-17 MED ORDER — HYDROMORPHONE HCL 4 MG PO TABS: 4.0000 mg | ORAL_TABLET | ORAL | 0 refills | Status: DC | PRN

## 2016-10-17 MED ORDER — PROMETHAZINE HCL 25 MG PO TABS: 25.0000 mg | ORAL_TABLET | Freq: Three times a day (TID) | ORAL | 0 refills | Status: DC | PRN

## 2016-10-17 MED ORDER — CLINDAMYCIN HCL 150 MG PO CAPS: 150.0000 mg | ORAL_CAPSULE | Freq: Three times a day (TID) | ORAL | 0 refills | Status: DC

## 2016-10-19 ENCOUNTER — Telehealth: Payer: Self-pay | Admitting: *Deleted

## 2016-10-19 ENCOUNTER — Encounter: Payer: Self-pay | Admitting: Podiatry

## 2016-10-19 DIAGNOSIS — M25675 Stiffness of left foot, not elsewhere classified: Secondary | ICD-10-CM

## 2016-10-19 DIAGNOSIS — Z4889 Encounter for other specified surgical aftercare: Secondary | ICD-10-CM

## 2016-10-19 NOTE — Telephone Encounter (Signed)
I called the patients pharmacy back that wanted to confirm the prescription for dilaudid however when calling the pharmacy they state that she left and took the prescription to another pharmacy.

## 2016-10-19 NOTE — Telephone Encounter (Signed)
[facility] 4[facility] .  I called [facility] and waited on the phone 9 minutes initially, and called back waited 12 minutes, and called again and waited 5 minutes and another pt called, I had to take the call.

## 2016-10-25 ENCOUNTER — Ambulatory Visit (INDEPENDENT_AMBULATORY_CARE_PROVIDER_SITE_OTHER): Payer: Medicare Other

## 2016-10-25 ENCOUNTER — Ambulatory Visit (INDEPENDENT_AMBULATORY_CARE_PROVIDER_SITE_OTHER): Payer: Medicare Other | Admitting: Podiatry

## 2016-10-25 ENCOUNTER — Encounter: Payer: Self-pay | Admitting: Podiatry

## 2016-10-25 VITALS — BP 125/62 | HR 78 | Temp 98.1°F | Resp 16

## 2016-10-25 DIAGNOSIS — M2032 Hallux varus (acquired), left foot: Secondary | ICD-10-CM

## 2016-10-25 DIAGNOSIS — M2012 Hallux valgus (acquired), left foot: Secondary | ICD-10-CM

## 2016-10-25 MED ORDER — HYDROMORPHONE HCL 4 MG PO TABS: 4.0000 mg | ORAL_TABLET | ORAL | 0 refills | Status: DC | PRN

## 2016-10-25 NOTE — Progress Notes (Signed)
Subjective:    Patient ID: Lacey Meyer, female   DOB: 56 y.o.   MRN: [id number]   HPI patient states her foot been feeling pretty good with mild swelling    ROS      Objective:  Physical Exam neurovascular status stable with negative Homans sign noted with patient's left foot doing well from forefoot reconstruction by Dr. Al CorpusHyatt 1 week ago. All incision sites are healing well with wound edges well coapted     Assessment:    Doing well post surgery left     Plan:    Advised patient on continued elevation compression and reapplied sterile dressing and will be seen back for stitch removal in 1 week or earlier if needed  X-rays indicate screws in place with good alignment of the digits and no indications of pathology

## 2016-11-01 ENCOUNTER — Encounter: Payer: Medicare Other | Admitting: Podiatry

## 2016-11-01 ENCOUNTER — Ambulatory Visit: Payer: Medicare Other

## 2016-11-01 ENCOUNTER — Ambulatory Visit (INDEPENDENT_AMBULATORY_CARE_PROVIDER_SITE_OTHER): Payer: Medicare Other | Admitting: Podiatry

## 2016-11-01 DIAGNOSIS — M2032 Hallux varus (acquired), left foot: Secondary | ICD-10-CM

## 2016-11-01 DIAGNOSIS — M2012 Hallux valgus (acquired), left foot: Secondary | ICD-10-CM

## 2016-11-01 DIAGNOSIS — Z9889 Other specified postprocedural states: Secondary | ICD-10-CM

## 2016-11-01 NOTE — Progress Notes (Signed)
She presents today 2 weeks status post removal internal fixation first metatarsal left foot as well as hallux interphalangeal joint fusion left foot. She states that she is doing very well very little pain.  Objective: Vital signs are stable she is alert and oriented 3 minimal edema about the left foot. Sutures are intact margins appear to be well coapted. She is good range of motion of the toe without pain.  Assessment: Well-healing surgical foot.  Plan: I removed some of this stitches today most proximal portion of the foot we will leave the remainder of the stitches intact. I will follow up with her in one week I redressed the foot today requesting her to keep this elevated stay off of it as much possible and to keep it dry. Follow up with her in 1 week for removal of the remainder of her stitches.

## 2016-11-08 ENCOUNTER — Ambulatory Visit (INDEPENDENT_AMBULATORY_CARE_PROVIDER_SITE_OTHER): Payer: Self-pay | Admitting: Podiatry

## 2016-11-08 ENCOUNTER — Encounter: Payer: Self-pay | Admitting: Podiatry

## 2016-11-08 DIAGNOSIS — M2032 Hallux varus (acquired), left foot: Secondary | ICD-10-CM

## 2016-11-08 DIAGNOSIS — T847XXD Infection and inflammatory reaction due to other internal orthopedic prosthetic devices, implants and grafts, subsequent encounter: Secondary | ICD-10-CM

## 2016-11-08 NOTE — Progress Notes (Signed)
She presents today 3 weeks status post removal internal fixation and hallux interphalangeal joint fusion left foot. She states this been hurting a lot today. She states that she was up on it a lot yesterday cleaning her house particularly her bathroom squatting down but she states she was wearing her boot the whole time. She states that she will she had not done that because now her foot is hurting and she is paying for it.  Objective: Vital signs are stable she is alert and oriented 3 she denies calf pain on palpation. Pulses are still strongly palpable sutures are intact margins well coapted remove the remainder of the sutures today no signs of infection appear us no malodor. She has tenderness on palpation of the surgical site but good range of motion of the toe does not appear to be any changes from previous evaluation.  Assessment: Well-healing surgical toe right.  Plan: Encouraged her to go home proper feet up and stay off of them for the next few days. I will follow up with her in 2 weeks in which time we will take x-rays and reevaluate as to whether or not she can wear shoe gear.

## 2016-11-20 ENCOUNTER — Ambulatory Visit (INDEPENDENT_AMBULATORY_CARE_PROVIDER_SITE_OTHER): Payer: Self-pay | Admitting: Podiatry

## 2016-11-20 ENCOUNTER — Encounter: Payer: Self-pay | Admitting: Podiatry

## 2016-11-20 ENCOUNTER — Ambulatory Visit (INDEPENDENT_AMBULATORY_CARE_PROVIDER_SITE_OTHER): Payer: Medicare Other

## 2016-11-20 DIAGNOSIS — M2032 Hallux varus (acquired), left foot: Secondary | ICD-10-CM

## 2016-11-20 DIAGNOSIS — T847XXD Infection and inflammatory reaction due to other internal orthopedic prosthetic devices, implants and grafts, subsequent encounter: Secondary | ICD-10-CM

## 2016-11-20 MED ORDER — HYDROMORPHONE HCL 4 MG PO TABS: 4.0000 mg | ORAL_TABLET | ORAL | 0 refills | Status: DC | PRN

## 2016-11-20 NOTE — Progress Notes (Signed)
She presents today states that her fourth forefoot left is still hurting. She is status post removal internal fixation date of surgery 10/19/2016. She also had a hallux interphalangeal joint fusion area she states that the joint is very sore and tender and she points to the great toe.  Objective: Vital signs are stable alert and oriented 3. She has tenderness on palpation of the hallux. Radiographs demonstrate a mild diastases between the distal and the proximal phalanges but the screw is intact. This does not appear to have fractured. At this point I feel that she is overactive.  Assessment: Noncompliance she is overactive on this foot resulting in swelling and diastases.  Plan: Encouraged her to continue to wear the cam boot. Also wrote another prescription for pain medicine. Follow-up with her in 4 weeks for another set of x-rays.

## 2016-11-30 NOTE — Progress Notes (Signed)
This encounter was created in error - please disregard.

## 2016-12-12 ENCOUNTER — Encounter: Payer: Self-pay | Admitting: Podiatry

## 2016-12-12 ENCOUNTER — Ambulatory Visit (INDEPENDENT_AMBULATORY_CARE_PROVIDER_SITE_OTHER): Payer: Medicare Other | Admitting: Podiatry

## 2016-12-12 DIAGNOSIS — M79676 Pain in unspecified toe(s): Secondary | ICD-10-CM | POA: Diagnosis not present

## 2016-12-12 DIAGNOSIS — B351 Tinea unguium: Secondary | ICD-10-CM | POA: Diagnosis not present

## 2016-12-12 DIAGNOSIS — Z794 Long term (current) use of insulin: Secondary | ICD-10-CM

## 2016-12-12 DIAGNOSIS — E1140 Type 2 diabetes mellitus with diabetic neuropathy, unspecified: Secondary | ICD-10-CM | POA: Diagnosis not present

## 2016-12-12 NOTE — Progress Notes (Addendum)
Patient ID: Lacey Meyer, female   DOB: 08-Jan-1961, 56 y.o.   MRN: [id number] Complaint:  Visit Type: Patient returns to my office for continued preventative foot care services. Complaint: Patient states" my nails have grown long and thick and become painful to walk and wear shoes" Patient has been diagnosed with DM with no foot complications. The patient presents for preventative foot care services. No changes to ROS. Patient says her foot is healing.   Podiatric Exam: Vascular: dorsalis pedis and posterior tibial pulses are palpable bilateral. Capillary return is immediate. Temperature gradient is WNL. Skin turgor WNL  Sensorium: Diminished  Semmes Weinstein monofilament test. Normal tactile sensation bilaterally. Nail Exam: Pt has thick disfigured discolored nails with subungual debris noted bilateral entire nail hallux through fifth toenails Ulcer Exam: There is no evidence of ulcer or pre-ulcerative changes or infection. Orthopedic Exam: Muscle tone and strength are WNL. No limitations in general ROM. No crepitus or effusions noted. Foot type and digits show no abnormalities. Bony prominences are unremarkable..Swelling left foot secondary to surgery. Skin: No Porokeratosis. No infection or ulcers.  Healing incision left foot.  Diagnosis:  Onychomycosis, , Pain in right toe, pain in left toes,    Treatment & Plan Procedures and Treatment: Consent by patient was obtained for treatment procedures. The patient understood the discussion of treatment and procedures well. All questions were answered thoroughly reviewed. Debridement of mycotic and hypertrophic toenails, 1 through 5 bilateral and clearing of subungual debris. No ulceration, no infection noted.  Signed ABN for 2018.    Return Visit-Office Procedure: Patient instructed to return to the office for a follow up visit 3 months for continued evaluation and treatment.     Helane Gunther DPM

## 2016-12-13 ENCOUNTER — Ambulatory Visit (INDEPENDENT_AMBULATORY_CARE_PROVIDER_SITE_OTHER): Payer: Medicare Other

## 2016-12-13 ENCOUNTER — Ambulatory Visit (INDEPENDENT_AMBULATORY_CARE_PROVIDER_SITE_OTHER): Payer: Medicare Other | Admitting: Podiatry

## 2016-12-13 ENCOUNTER — Encounter: Payer: Self-pay | Admitting: Podiatry

## 2016-12-13 DIAGNOSIS — M2032 Hallux varus (acquired), left foot: Secondary | ICD-10-CM

## 2016-12-13 DIAGNOSIS — T847XXD Infection and inflammatory reaction due to other internal orthopedic prosthetic devices, implants and grafts, subsequent encounter: Secondary | ICD-10-CM | POA: Diagnosis not present

## 2016-12-14 NOTE — Progress Notes (Signed)
She presents today for follow-up of her hallux interphalangeal joint fusion date of surgery 10/19/2016. We also removed the fixation at that time from a previous Akin osteotomy. She states she's doing much better now since she's been in the surgical shoe more and has been doing less at home.  Objective: Vital signs are stable she is alert and oriented 3. Toe has decreased considerably and swelling no erythema cellulitis drainage or odor left hallux. Radiographs taken today still demonstrates no arthrodesis at this point. I expressed to her that she has distracted the joint at the arthrodesis site by being active on this. She understands that she is to heals she cannot be overly aggressive on this toe.  Assessment: Arthrodesis hallux interphalangeal joint left.  Plan: I encouraged her to continue to wear the Darco shoe for while longer will follow up with me in the next month. X-rays will be performed next time she comes in.

## 2016-12-17 NOTE — Progress Notes (Signed)
DOS 8.10.18 Removal fixation screw 1st met left,  Hallux interphalangeal joint fusion with screw

## 2017-01-14 ENCOUNTER — Encounter: Payer: Medicare Other | Attending: Surgery | Admitting: Skilled Nursing Facility1

## 2017-01-14 ENCOUNTER — Encounter: Payer: Self-pay | Admitting: Skilled Nursing Facility1

## 2017-01-14 DIAGNOSIS — E119 Type 2 diabetes mellitus without complications: Secondary | ICD-10-CM

## 2017-01-14 DIAGNOSIS — Z6839 Body mass index (BMI) 39.0-39.9, adult: Secondary | ICD-10-CM | POA: Insufficient documentation

## 2017-01-14 DIAGNOSIS — Z713 Dietary counseling and surveillance: Secondary | ICD-10-CM | POA: Diagnosis not present

## 2017-01-14 NOTE — Progress Notes (Signed)
Post-Operative Sleeve gastrectomy Surgery  Medical Nutrition Therapy:  Appt start time: 255 end time:  400  Primary concerns today: Post-operative Bariatric Surgery Nutrition Management. Pt does not want to be weighed.   Pt states her peniculus has been really bothering her having to lift it up and it sits on her lap causing rashes and infection. Pt states she does not have an appetite. Pt states yesterday she only had water and some protein shake. Pt states she has had a sinus infection and taking an antibiotic. Pt states she is going to try to get to water aerobics. Pt states she has dentures.    Surgery date: 01/03/2016 Surgery type: Sleeve gastrectomy [facility] lbs on 11/10/2014, 268 lbs on 12/12/2015  Weight today: pt declined  Weight change:    TANITA  BODY COMP RESULTS  12/12/15 01/18/16 02/27/16 04/16/16 07/25/16   BMI (kg/m^2) 43.3 39.9 39.5 39.9 39.0   Fat Mass (lbs) 136.4 129.2 119.8 117 131.2   Fat Free Mass (lbs) 131.6 118.2 125 130.4 125.6   Total Body Water (lbs) 96.6 86.4 91.2 94.8 92    Preferred Learning Style:   No preference indicated   Learning Readiness:   Ready  24-hr recall: states she eats a lot of fruit B (AM): oatmeal and 1 sausage fruit Snk (AM): half protein shake-fruit L (11-12PM): baked chicken squash and greens                        Sugar free jello D (3-4PM): meat loaf and pinto beans-----chicken wings----lettuce and lunch meat and cheese rollup Snk (PM): fruit  Fluid intake: water (84.5), apple juice (8oz) for low blood sugars (patient unsure of total amount of fluid) or white peach grape cranberry,  half a cup of coffee  Estimated total protein intake:   Medications: see list Supplementation: not taking and taking calcium  CBG monitoring: several times throughout the day  Average CBG per patient: 135 or 220 mg/dL Last patient reported [id number]: 5.8  Drinking while eating: no Hair loss: did not assess Carbonated beverages:  none N/V/D/C: vomiting, diarrhea from antibiotic  Dumping syndrome: yes with fruit Having you been chewing well: no Chewing/swallowing difficulties:  no Changes in vision: no Changes to mood/headaches: no Hair loss/Cahnges to skin/Changes to nails: no Any difficulty focusing or concentrating: no Sweating: no Dizziness/Lightheaded: no Palpitations: no  Carbonated beverages: no  Recent physical activity:  BELT  Progress Towards Goal(s):  In progress.  Handouts given during visit include:  none   Nutritional Diagnosis:  Adrian-3.3 Overweight/obesity related to past poor dietary habits and physical inactivity as evidenced by patient w/ recent sleeve gastrectomy surgery following dietary guidelines for continued weight loss.     Intervention:  Nutrition counseling provided. Dietitian educated ht ept on her focusing on one small goal at once to avoid getting overwhelmed.  Goals: -Get some kefir in the yogurt aisle; 4 ounces every morning -For days you have no appetite have 2 protein shakes and a cup of kefir -When you start eating eat every 3-5 hours -Get the [employer] health multivitamin  -Have cheese with your grapes Teaching Method Utilized:  Visual Auditory Hands on  Barriers to learning/adherence to lifestyle change: none  Demonstrated degree of understanding via:  Teach Back   Monitoring/Evaluation:  Dietary intake, exercise, and body weight. Follow up in 2 months

## 2017-01-14 NOTE — Patient Instructions (Addendum)
-  Get some kefir in the yogurt aisle; 4 ounces every morning  -For days you have no appetite have 2 protein shakes and a cup of kefir  -When you start eating eat every 3-5 hours  -Get the [employer] health multivitamin   -Have cheese with your grapes

## 2017-01-17 ENCOUNTER — Ambulatory Visit (INDEPENDENT_AMBULATORY_CARE_PROVIDER_SITE_OTHER): Payer: Medicare Other

## 2017-01-17 ENCOUNTER — Ambulatory Visit (INDEPENDENT_AMBULATORY_CARE_PROVIDER_SITE_OTHER): Payer: Medicare Other | Admitting: Podiatry

## 2017-01-17 DIAGNOSIS — M2032 Hallux varus (acquired), left foot: Secondary | ICD-10-CM

## 2017-01-17 DIAGNOSIS — M2012 Hallux valgus (acquired), left foot: Secondary | ICD-10-CM

## 2017-01-17 NOTE — Progress Notes (Signed)
She presents today for her hallux IPJ fusion states that her foot feels great I have no problems whatsoever. She refers to her hallux interphalangeal joint fusion date of surgery 10/19/2016.  Objective: Vital signs are stable she is alert and oriented 3. Pulses are palpable. Much decrease in edema to the left foot. She has great range of motion of the metatarsophalangeal joint. Pain on palpation of the toe. Radiographs taken today demonstrate finally a bony bridge across the 2 bones. There appears to be fusing very nicely.  Assessment: Well healing fusion hallux interphalangeal joint.  Plan: Follow up with me in 6 months for a diabetic check.

## 2017-03-13 ENCOUNTER — Encounter: Payer: Self-pay | Admitting: Podiatry

## 2017-03-13 ENCOUNTER — Ambulatory Visit (INDEPENDENT_AMBULATORY_CARE_PROVIDER_SITE_OTHER): Payer: Medicare Other | Admitting: Podiatry

## 2017-03-13 DIAGNOSIS — B351 Tinea unguium: Secondary | ICD-10-CM

## 2017-03-13 DIAGNOSIS — M79676 Pain in unspecified toe(s): Secondary | ICD-10-CM

## 2017-03-13 DIAGNOSIS — Z794 Long term (current) use of insulin: Secondary | ICD-10-CM

## 2017-03-13 DIAGNOSIS — E1140 Type 2 diabetes mellitus with diabetic neuropathy, unspecified: Secondary | ICD-10-CM

## 2017-03-13 NOTE — Progress Notes (Signed)
Patient ID: Lacey BlowerFrances Y Schaumburg, female   DOB: 03/27/1960, 57 y.o.   MRN: [id number] Complaint:  Visit Type: Patient returns to my office for continued preventative foot care services. Complaint: Patient states" my nails have grown long and thick and become painful to walk and wear shoes" Patient has been diagnosed with DM with no foot complications. The patient presents for preventative foot care services. No changes to ROS. Patient says her foot is healing.   Podiatric Exam: Vascular: dorsalis pedis and posterior tibial pulses are palpable bilateral. Capillary return is immediate. Temperature gradient is WNL. Skin turgor WNL  Sensorium: Diminished  Semmes Weinstein monofilament test. Normal tactile sensation bilaterally. Nail Exam: Pt has thick disfigured discolored nails with subungual debris noted bilateral entire nail hallux through fifth toenails Ulcer Exam: There is no evidence of ulcer or pre-ulcerative changes or infection. Orthopedic Exam: Muscle tone and strength are WNL. No limitations in general ROM. No crepitus or effusions noted. Foot type and digits show no abnormalities. Bony prominences are unremarkable..Swelling left foot secondary to surgery. Skin: No Porokeratosis. No infection or ulcers.  Healing incision left foot.  Diagnosis:  Onychomycosis, , Pain in right toe, pain in left toes,    Treatment & Plan Procedures and Treatment: Consent by patient was obtained for treatment procedures. The patient understood the discussion of treatment and procedures well. All questions were answered thoroughly reviewed. Debridement of mycotic and hypertrophic toenails, 1 through 5 bilateral and clearing of subungual debris. No ulceration, no infection noted.  Signed ABN for 2019.    Return Visit-Office Procedure: Patient instructed to return to the office for a follow up visit 3 months for continued evaluation and treatment.     Helane GuntherGregory Elga Santy DPM

## 2017-03-25 ENCOUNTER — Ambulatory Visit: Payer: Self-pay | Admitting: Skilled Nursing Facility1

## 2017-04-20 ENCOUNTER — Ambulatory Visit (HOSPITAL_COMMUNITY)
Admission: EM | Admit: 2017-04-20 | Discharge: 2017-04-20 | Disposition: A | Discharge status: Home / Self Care | Payer: Medicare Other | Attending: Family Medicine | Admitting: Family Medicine

## 2017-04-20 ENCOUNTER — Encounter (HOSPITAL_COMMUNITY): Payer: Self-pay | Admitting: Emergency Medicine

## 2017-04-20 DIAGNOSIS — R300 Dysuria: Secondary | ICD-10-CM | POA: Diagnosis not present

## 2017-04-20 DIAGNOSIS — N3090 Cystitis, unspecified without hematuria: Secondary | ICD-10-CM | POA: Diagnosis not present

## 2017-04-20 LAB — POCT URINALYSIS DIP (DEVICE)
Bilirubin Urine: NEGATIVE
GLUCOSE, UA: NEGATIVE mg/dL
Ketones, ur: NEGATIVE mg/dL
NITRITE: POSITIVE — AB
PROTEIN: 100 mg/dL — AB
SPECIFIC GRAVITY, URINE: 1.02 (ref 1.005–1.030)
UROBILINOGEN UA: 0.2 mg/dL (ref 0.0–1.0)
pH: 7.5 (ref 5.0–8.0)

## 2017-04-20 MED ORDER — PHENAZOPYRIDINE HCL 200 MG PO TABS: 200.0000 mg | ORAL_TABLET | Freq: Three times a day (TID) | ORAL | 0 refills | Status: DC

## 2017-04-20 MED ORDER — NITROFURANTOIN MONOHYD MACRO 100 MG PO CAPS: 100.0000 mg | ORAL_CAPSULE | Freq: Two times a day (BID) | ORAL | 0 refills | Status: DC

## 2017-04-20 NOTE — ED Triage Notes (Signed)
PT reports UTI symptoms since Thursday

## 2017-04-20 NOTE — ED Provider Notes (Signed)
[facility]    CSN: [id number] Arrival date & time: 04/20/17  1230     History   Chief Complaint Chief Complaint  Patient presents with  . Urinary Tract Infection    HPI Lacey Meyer is a 57 y.o. female.   57 year old female comes in for 2-day history of UTI symptoms.  She has had frequency, dysuria, urgency.  Denies hematuria, but states still some blood with wiping.  Denies abdominal pain, nausea, vomiting.  Denies fever, chills, night sweats.  States drinking cranberry juice.  Last A1c 7.      Past Medical History:  Diagnosis Date  . Anemia   . Arthritis   . Diabetes mellitus without complication (HCC)   . GERD (gastroesophageal reflux disease)   . Gout   . History of blood transfusion   . HTN (hypertension)   . Impaired vision     Patient Active Problem List   Diagnosis Date Noted  . Community acquired pneumonia of left lower lobe of lung (HCC)   . Lactic acidosis   . Left lower lobe pneumonia (HCC) 06/16/2016  . Sepsis (HCC) 06/16/2016  . Acute encephalopathy 03/19/2016  . Diabetes mellitus type 2 in obese (HCC) 03/19/2016  . Altered mental status 03/18/2016  . Morbid obesity (HCC) 01/03/2016  . 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 Pain in limb 12/23/2012  . Arthritis of foot 12/23/2012  . Obesity, Class II, BMI 35-39.9, with comorbidity 06/25/2011  . HTN (hypertension)   . Impaired vision   . Gout   . Anemia   . Nuclear sclerotic cataract 02/20/2011  . Pseudoaphakia 02/20/2011  . 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 Acute loss of vision 12/15/2010    Past Surgical History:  Procedure Laterality Date  . BTL    . EYE SURGERY     cataract surgery / right eye   . FOOT SURGERY     bilateral   . LAPAROSCOPIC GASTRIC SLEEVE RESECTION N/A 01/03/2016   Procedure: LAPAROSCOPIC GASTRIC SLEEVE RESECTION WITH UPPER ENDO;  Surgeon: Ovidio Kin, MD;  Location: [facility];  Service: General;  Laterality:  N/A;  . SHOULDER ARTHROSCOPY W/ ROTATOR CUFF REPAIR     left   . TUBAL LIGATION      OB History    No data available       Home Medications    Prior to Admission medications   Medication Sig Start Date End Date Taking? Authorizing Provider  allopurinol (ZYLOPRIM) 300 MG tablet Take 1 tablet (300 mg total) by mouth daily. 10/03/11   Elvina Sidle, MD  ALPRAZolam Prudy Feeler) 0.25 MG tablet Take 0.25 mg by mouth daily as needed for anxiety. 05/28/16   [provider]  benazepril (LOTENSIN) 10 MG tablet Take 10 mg by mouth daily. 05/09/16   [provider]  chlorpheniramine-HYDROcodone (TUSSIONEX) 10-8 MG/5ML SUER Take 5 mLs by mouth every 12 (twelve) hours. 06/17/16   Vassie Loll, MD  DULoxetine (CYMBALTA) 60 MG capsule Take 60 mg by mouth daily as needed for anxiety. 03/23/16   [provider]  furosemide (LASIX) 40 MG tablet Take 1 tablet (40 mg total) by mouth daily. 06/18/16   Vassie Loll, MD  glimepiride (AMARYL) 4 MG tablet Take 4 mg by mouth daily. 10/16/15   [provider]  HYDROmorphone (DILAUDID) 4 MG tablet Take 1 tablet (4 mg total) by mouth every 4 (four) hours as needed for severe pain. 11/20/16   Hyatt, Max T,  DPM  LYRICA 200 MG capsule Take 200 mg by mouth 3 (three) times daily.  08/04/15   [provider]  metFORMIN (GLUCOPHAGE) 500 MG tablet Take 500 mg by mouth 2 (two) times daily.  02/28/15   [provider]  metoprolol succinate (TOPROL-XL) 50 MG 24 hr tablet Take 50 mg by mouth daily.  12/28/15   [provider]  Multiple Vitamins-Minerals (BARIATRIC FUSION PO) Take 1 tablet by mouth daily.    [provider]  nitrofurantoin, macrocrystal-monohydrate, (MACROBID) 100 MG capsule Take 1 capsule (100 mg total) by mouth 2 (two) times daily. 04/20/17   Cathie Hoops, Braeden Kennan V, PA-C  NOVOLOG FLEXPEN 100 UNIT/ML FlexPen Inject 2-4 Units into the skin daily as needed. 04/23/16   [provider]  Oxycodone HCl 10 MG TABS  Take 10 mg by mouth every 8 (eight) hours as needed. 02/29/16   [provider]  pantoprazole (PROTONIX) 40 MG tablet Take 1 tablet (40 mg total) by mouth daily. 06/17/16   Vassie Loll, MD  phenazopyridine (PYRIDIUM) 200 MG tablet Take 1 tablet (200 mg total) by mouth 3 (three) times daily. 04/20/17   Cathie Hoops, Chiara Coltrin V, PA-C  simvastatin (ZOCOR) 40 MG tablet Take 20 mg by mouth daily. 05/21/16   [provider]    Family History Family History  Problem Relation Age of Onset  . Hypertension Mother   . Hypertension Father     Social History Social History   Tobacco Use  . Smoking status: Former Smoker    Packs/day: 0.25    Years: 1.00    Pack years: 0.25    Types: Cigarettes    Last attempt to quit: 03/06/2010    Years since quitting: 7.1  . Smokeless tobacco: Never Used  Substance Use Topics  . Alcohol use: No  . Drug use: No     Allergies   Ampicillin; Doxycycline; and Other   Review of Systems Review of Systems  Reason unable to perform ROS: See HPI as above.     Physical Exam Triage Vital Signs ED Triage Vitals  Enc Vitals Group     BP 04/20/17 1411 (!) 188/76     Pulse Rate 04/20/17 1409 84     Resp 04/20/17 1409 16     Temp 04/20/17 1409 98.4 F (36.9 C)     Temp Source 04/20/17 1409 Oral     SpO2 04/20/17 1409 100 %     Weight 04/20/17 1411 256 lb (116.1 kg)     Height --      Head Circumference --      Peak Flow --      Pain Score 04/20/17 1410 0     Pain Loc --      Pain Edu? --      Excl. in GC? --    No data found.  Updated Vital Signs BP (!) 188/76   Pulse 84   Temp 98.4 F (36.9 C) (Oral)   Resp 16   Wt 256 lb (116.1 kg)   SpO2 100%   BMI 38.92 kg/m   Physical Exam  Constitutional: She is oriented to person, place, and time. She appears well-developed and well-nourished. No distress.  Eyes: Conjunctivae are normal. Pupils are equal, round, and reactive to light.  Cardiovascular: Normal rate, regular rhythm and normal  heart sounds. Exam reveals no gallop and no friction rub.  No murmur heard. Pulmonary/Chest: Effort normal and breath sounds normal. She has no wheezes. She has no rales.  Abdominal: Soft. Bowel sounds are normal. She exhibits no distension. There is no tenderness. There is no rebound, no guarding and no CVA tenderness.  Neurological: She is alert and oriented to person, place, and time.  Skin: Skin is warm and dry.     UC Treatments / Results  Labs (all labs ordered are listed, but only abnormal results are displayed) Labs Reviewed  POCT URINALYSIS DIP (DEVICE) - Abnormal; Notable for the following components:      Result Value   Hgb urine dipstick MODERATE (*)    Protein, ur 100 (*)    Nitrite POSITIVE (*)    Leukocytes, UA LARGE (*)    All other components within normal limits  URINE CULTURE   Lab Results  Component Value Date   HGBA1C 10.0 (H) 12/29/2015     EKG  EKG Interpretation None       Radiology No results found.  Procedures Procedures (including critical care time)  Medications Ordered in UC Medications - No data to display   Initial Impression / Assessment and Plan / UC Course  I have reviewed the triage vital signs and the nursing notes.  Pertinent labs & imaging results that were available during my care of the patient were reviewed by me and considered in my medical decision making (see chart for details).    Urine dipstick positive for UTI. Start antibiotics as directed.  Pyridium for dysuria.  Push fluids. Return precautions given.   Final Clinical Impressions(s) / UC Diagnoses   Final diagnoses:  Cystitis    ED Discharge Orders        Ordered    nitrofurantoin, macrocrystal-monohydrate, (MACROBID) 100 MG capsule  2 times daily     04/20/17 1430    phenazopyridine (PYRIDIUM) 200 MG tablet  3 times daily     04/20/17 1430         Belinda FisherYu, Marckus Hanover V, [place] JerseyPA-C 04/20/17 1433

## 2017-04-20 NOTE — Discharge Instructions (Signed)
Your urine was positive for an urinary tract infection. Start Macrobid as directed. Pyridium for burning with you urinate. Keep hydrated, your urine should be clear to pale yellow in color. Monitor for any worsening of symptoms, fever, worsening abdominal pain, nausea/vomiting, flank pain, follow up for reevaluation.

## 2017-04-28 ENCOUNTER — Other Ambulatory Visit: Payer: Self-pay

## 2017-04-28 ENCOUNTER — Ambulatory Visit (HOSPITAL_COMMUNITY)
Admission: EM | Admit: 2017-04-28 | Discharge: 2017-04-28 | Disposition: A | Discharge status: Home / Self Care | Payer: Medicare Other | Attending: Internal Medicine | Admitting: Internal Medicine

## 2017-04-28 DIAGNOSIS — N3090 Cystitis, unspecified without hematuria: Secondary | ICD-10-CM | POA: Insufficient documentation

## 2017-04-28 DIAGNOSIS — M1990 Unspecified osteoarthritis, unspecified site: Secondary | ICD-10-CM | POA: Insufficient documentation

## 2017-04-28 DIAGNOSIS — Z881 Allergy status to other antibiotic agents status: Secondary | ICD-10-CM | POA: Diagnosis not present

## 2017-04-28 DIAGNOSIS — K219 Gastro-esophageal reflux disease without esophagitis: Secondary | ICD-10-CM | POA: Diagnosis not present

## 2017-04-28 DIAGNOSIS — I10 Essential (primary) hypertension: Secondary | ICD-10-CM | POA: Insufficient documentation

## 2017-04-28 DIAGNOSIS — Z7984 Long term (current) use of oral hypoglycemic drugs: Secondary | ICD-10-CM | POA: Diagnosis not present

## 2017-04-28 DIAGNOSIS — E119 Type 2 diabetes mellitus without complications: Secondary | ICD-10-CM | POA: Insufficient documentation

## 2017-04-28 DIAGNOSIS — Z880 Allergy status to penicillin: Secondary | ICD-10-CM | POA: Insufficient documentation

## 2017-04-28 DIAGNOSIS — Z87440 Personal history of urinary (tract) infections: Secondary | ICD-10-CM | POA: Diagnosis present

## 2017-04-28 DIAGNOSIS — Z79899 Other long term (current) drug therapy: Secondary | ICD-10-CM | POA: Insufficient documentation

## 2017-04-28 DIAGNOSIS — M109 Gout, unspecified: Secondary | ICD-10-CM | POA: Diagnosis not present

## 2017-04-28 DIAGNOSIS — Z87891 Personal history of nicotine dependence: Secondary | ICD-10-CM | POA: Insufficient documentation

## 2017-04-28 LAB — POCT URINALYSIS DIP (DEVICE)
Bilirubin Urine: NEGATIVE
GLUCOSE, UA: 500 mg/dL — AB
NITRITE: NEGATIVE
Protein, ur: 100 mg/dL — AB
Specific Gravity, Urine: >=1.03 (ref 1.005–1.030)
Urobilinogen, UA: 0.2 mg/dL (ref 0.0–1.0)
pH: 5.5 (ref 5.0–8.0)

## 2017-04-28 MED ORDER — SULFAMETHOXAZOLE-TRIMETHOPRIM 800-160 MG PO TABS: 1.0000 | ORAL_TABLET | Freq: Two times a day (BID) | ORAL | 0 refills | Status: AC

## 2017-04-28 NOTE — ED Triage Notes (Signed)
Per pt she was here last Saturday with UTI symptoms. Per pt it's not bad like before but she is still having symptoms, per pt she is having little pressure, little burning

## 2017-04-28 NOTE — ED Notes (Signed)
Urine specimen obtained and in lab 

## 2017-04-28 NOTE — Discharge Instructions (Signed)
Your urine was positive for an urinary tract infection. Start Bactrim as directed. Keep hydrated, your urine should be clear to pale yellow in color. Urine culture sent, we will contact you with any positive results. Monitor for any worsening of symptoms, fever, worsening abdominal pain, nausea/vomiting, flank pain, follow up for reevaluation.

## 2017-04-28 NOTE — ED Provider Notes (Signed)
[facility]    CSN: [id number] Arrival date & time: 04/28/17  1215     History   Chief Complaint Chief Complaint  Patient presents with  . Recurrent UTI    HPI  Lacey Meyer is a 57 y.o. female.   57 year old female with history of diabetes, anemia, arthritis comes in for continued UTI symptoms after being treated a week ago.  States symptoms greatly improved with Macrobid and Pyridium, finished last dose of  Macrobid 3 days ago.  States she still has pressure sensation with urgency when urinating, but significantly better than before.  She denies abdominal pain, nausea, vomiting.  Denies fever, chills, night sweats.  Denies vaginal discharge, itching/pain, spotting.      Past Medical History:  Diagnosis Date  . Anemia   . Arthritis   . Diabetes mellitus without complication (HCC)   . GERD (gastroesophageal reflux disease)   . Gout   . History of blood transfusion   . HTN (hypertension)   . Impaired vision     Patient Active Problem List   Diagnosis Date Noted  . Community acquired pneumonia of left lower lobe of lung (HCC)   . Lactic acidosis   . Left lower lobe pneumonia (HCC) 06/16/2016  . Sepsis (HCC) 06/16/2016  . Acute encephalopathy 03/19/2016  . Diabetes mellitus type 2 in obese (HCC) 03/19/2016  . Altered mental status 03/18/2016  . Morbid obesity (HCC) 01/03/2016  . 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 Pain in limb 12/23/2012  . Arthritis of foot 12/23/2012  . Obesity, Class II, BMI 35-39.9, with comorbidity 06/25/2011  . HTN (hypertension)   . Impaired vision   . Gout   . Anemia   . Nuclear sclerotic cataract 02/20/2011  . Pseudoaphakia 02/20/2011  . 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 Acute loss of vision 12/15/2010    Past Surgical History:  Procedure Laterality Date  . BTL    . EYE SURGERY     cataract surgery / right eye   . FOOT SURGERY     bilateral   . LAPAROSCOPIC GASTRIC  SLEEVE RESECTION N/A 01/03/2016   Procedure: LAPAROSCOPIC GASTRIC SLEEVE RESECTION WITH UPPER ENDO;  Surgeon: Ovidio Kin, MD;  Location: [facility];  Service: General;  Laterality: N/A;  . SHOULDER ARTHROSCOPY W/ ROTATOR CUFF REPAIR     left   . TUBAL LIGATION      OB History    No data available       Home Medications    Prior to Admission medications   Medication Sig Start Date End Date Taking? Authorizing Provider  allopurinol (ZYLOPRIM) 300 MG tablet Take 1 tablet (300 mg total) by mouth daily. 10/03/11   Elvina Sidle, MD  ALPRAZolam Prudy Feeler) 0.25 MG tablet Take 0.25 mg by mouth daily as needed for anxiety. 05/28/16   [provider]  benazepril (LOTENSIN) 10 MG tablet Take 10 mg by mouth daily. 05/09/16   [provider]  chlorpheniramine-HYDROcodone (TUSSIONEX) 10-8 MG/5ML SUER Take 5 mLs by mouth every 12 (twelve) hours. 06/17/16   Vassie Loll, MD  DULoxetine (CYMBALTA) 60 MG capsule Take 60 mg by mouth daily as needed for anxiety. 03/23/16   [provider]  furosemide (LASIX) 40 MG tablet Take 1 tablet (40 mg total) by mouth daily. 06/18/16   Vassie Loll, MD  glimepiride (AMARYL) 4 MG tablet Take 4 mg by mouth daily. 10/16/15   [provider]  HYDROmorphone (DILAUDID) 4  MG tablet Take 1 tablet (4 mg total) by mouth every 4 (four) hours as needed for severe pain. 11/20/16   Hyatt, Max T, DPM  LYRICA 200 MG capsule Take 200 mg by mouth 3 (three) times daily.  08/04/15   [provider]  metFORMIN (GLUCOPHAGE) 500 MG tablet Take 500 mg by mouth 2 (two) times daily.  02/28/15   [provider]  metoprolol succinate (TOPROL-XL) 50 MG 24 hr tablet Take 50 mg by mouth daily.  12/28/15   [provider]  Multiple Vitamins-Minerals (BARIATRIC FUSION PO) Take 1 tablet by mouth daily.    [provider]  nitrofurantoin, macrocrystal-monohydrate, (MACROBID) 100 MG capsule Take 1 capsule (100 mg total) by mouth 2 (two) times  daily. 04/20/17   Cathie HoopsYu, Samentha Perham V, PA-C  NOVOLOG FLEXPEN 100 UNIT/ML FlexPen Inject 2-4 Units into the skin daily as needed. 04/23/16   [provider]  Oxycodone HCl 10 MG TABS Take 10 mg by mouth every 8 (eight) hours as needed. 02/29/16   [provider]  pantoprazole (PROTONIX) 40 MG tablet Take 1 tablet (40 mg total) by mouth daily. 06/17/16   Vassie LollMadera, Carlos, MD  phenazopyridine (PYRIDIUM) 200 MG tablet Take 1 tablet (200 mg total) by mouth 3 (three) times daily. 04/20/17   Cathie HoopsYu, Erinn Mendosa V, PA-C  simvastatin (ZOCOR) 40 MG tablet Take 20 mg by mouth daily. 05/21/16   [provider]  sulfamethoxazole-trimethoprim (BACTRIM DS,SEPTRA DS) 800-160 MG tablet Take 1 tablet by mouth 2 (two) times daily for 3 days. 04/28/17 05/01/17  Belinda FisherYu, Mikayah Joy V, PA-C    Family History Family History  Problem Relation Age of Onset  . Hypertension Mother   . Hypertension Father     Social History Social History   Tobacco Use  . Smoking status: Former Smoker    Packs/day: 0.25    Years: 1.00    Pack years: 0.25    Types: Cigarettes    Last attempt to quit: 03/06/2010    Years since quitting: 7.1  . Smokeless tobacco: Never Used  Substance Use Topics  . Alcohol use: No  . Drug use: No     Allergies   Ampicillin; Doxycycline; and Other   Review of Systems Review of Systems  Reason unable to perform ROS: See HPI as above.     Physical Exam Triage Vital Signs ED Triage Vitals  Enc Vitals Group     BP 04/28/17 1310 (!) 170/70     Pulse Rate 04/28/17 1310 82     Resp 04/28/17 1310 18     Temp 04/28/17 1310 (!) 97 F (36.1 C)     Temp Source 04/28/17 1310 Oral     SpO2 04/28/17 1310 100 %     Weight --      Height --      Head Circumference --      Peak Flow --      Pain Score 04/28/17 1309 4     Pain Loc --      Pain Edu? --      Excl. in GC? --    No data found.  Updated Vital Signs BP (!) 170/70 (BP Location: Right Arm)   Pulse 82   Temp (!) 97 F (36.1 C) (Oral)   Resp  18   SpO2 100%   Physical Exam  Constitutional: She is oriented to person, place, and time. She appears well-developed and well-nourished. No distress.  HENT:  Head: Normocephalic and atraumatic.  Eyes:  Conjunctivae are normal. Pupils are equal, round, and reactive to light.  Cardiovascular: Normal rate, regular rhythm and normal heart sounds. Exam reveals no gallop and no friction rub.  No murmur heard. Pulmonary/Chest: Effort normal and breath sounds normal. She has no wheezes. She has no rales.  Abdominal: Soft. Bowel sounds are normal. She exhibits no mass. There is no tenderness. There is no rebound, no guarding and no CVA tenderness.  Neurological: She is alert and oriented to person, place, and time.  Skin: Skin is warm and dry.  Psychiatric: She has a normal mood and affect. Her behavior is normal. Judgment normal.     UC Treatments / Results  Labs (all labs ordered are listed, but only abnormal results are displayed) Labs Reviewed  POCT URINALYSIS DIP (DEVICE) - Abnormal; Notable for the following components:      Result Value   Glucose, UA 500 (*)    Ketones, ur TRACE (*)    Hgb urine dipstick MODERATE (*)    Protein, ur 100 (*)    Leukocytes, UA SMALL (*)    All other components within normal limits  URINE CULTURE    EKG  EKG Interpretation None       Radiology No results found.  Procedures Procedures (including critical care time)  Medications Ordered in UC Medications - No data to display   Initial Impression / Assessment and Plan / UC Course  I have reviewed the triage vital signs and the nursing notes.  Pertinent labs & imaging results that were available during my care of the patient were reviewed by me and considered in my medical decision making (see chart for details).    Discussed with patient, urine culture ordered last visit, but was not ran.  Patient had negative nitrite, small leukocytes, moderate Hgb on urine dipstick today.  Discussed  running urine culture today, and awaiting results for treatment versus starting another antibiotic.  Patient would like to start another antibiotic today, start Bactrim as directed.  Urine culture sent.  Push fluids.  Return precautions.  Patient expresses understanding and agrees to plan.  Final Clinical Impressions(s) / UC Diagnoses   Final diagnoses:  Cystitis    ED Discharge Orders        Ordered    sulfamethoxazole-trimethoprim (BACTRIM DS,SEPTRA DS) 800-160 MG tablet  2 times daily     04/28/17 1354       Belinda Fisher, PA-C 04/28/17 1950

## 2017-05-01 LAB — URINE CULTURE: Culture: >=100000 — AB

## 2017-06-12 ENCOUNTER — Ambulatory Visit: Payer: Medicare Other | Admitting: Podiatry

## 2017-06-18 ENCOUNTER — Ambulatory Visit (INDEPENDENT_AMBULATORY_CARE_PROVIDER_SITE_OTHER): Payer: Medicare Other | Admitting: Podiatry

## 2017-06-18 ENCOUNTER — Encounter: Payer: Self-pay | Admitting: Podiatry

## 2017-06-18 DIAGNOSIS — E1140 Type 2 diabetes mellitus with diabetic neuropathy, unspecified: Secondary | ICD-10-CM

## 2017-06-18 DIAGNOSIS — M2012 Hallux valgus (acquired), left foot: Secondary | ICD-10-CM

## 2017-06-18 DIAGNOSIS — B351 Tinea unguium: Secondary | ICD-10-CM | POA: Diagnosis not present

## 2017-06-18 DIAGNOSIS — Z794 Long term (current) use of insulin: Secondary | ICD-10-CM

## 2017-06-18 DIAGNOSIS — M79676 Pain in unspecified toe(s): Secondary | ICD-10-CM

## 2017-06-18 NOTE — Progress Notes (Signed)
Patient ID: Lacey Meyer, female   DOB: 02/08/1961, 57 y.o.   MRN: [id number] Complaint:  Visit Type: Patient returns to my office for continued preventative foot care services. Complaint: Patient states" my nails have grown long and thick and become painful to walk and wear shoes" Patient has been diagnosed with DM with no foot complications. The patient presents for preventative foot care services. No changes to ROS. Patient says her foot is healing.   Podiatric Exam: Vascular: dorsalis pedis and posterior tibial pulses are palpable bilateral. Capillary return is immediate. Temperature gradient is WNL. Skin turgor WNL  Sensorium: Diminished  Semmes Weinstein monofilament test. Normal tactile sensation bilaterally. Nail Exam: Pt has thick disfigured discolored nails with subungual debris noted bilateral entire nail hallux through fifth toenails Ulcer Exam: There is no evidence of ulcer or pre-ulcerative changes or infection. Orthopedic Exam: Muscle tone and strength are WNL. No limitations in general ROM. No crepitus or effusions noted. Foot type and digits show no abnormalities. Bony prominences are unremarkable..Swelling left foot secondary to surgery. Skin: No Porokeratosis. No infection or ulcers.  Healing incision left foot.  Diagnosis:  Onychomycosis, , Pain in right toe, pain in left toes,    Treatment & Plan Procedures and Treatment: Consent by patient was obtained for treatment procedures. The patient understood the discussion of treatment and procedures well. All questions were answered thoroughly reviewed. Debridement of mycotic and hypertrophic toenails, 1 through 5 bilateral and clearing of subungual debris. No ulceration, no infection noted.  Signed ABN for 2019.    Return Visit-Office Procedure: Patient instructed to return to the office for a follow up visit 3 months for continued evaluation and treatment.     Helane GuntherGregory Glori Machnik DPM

## 2017-07-18 ENCOUNTER — Encounter: Payer: Self-pay | Admitting: Podiatry

## 2017-07-18 ENCOUNTER — Ambulatory Visit (INDEPENDENT_AMBULATORY_CARE_PROVIDER_SITE_OTHER): Payer: Medicare Other | Admitting: Podiatry

## 2017-07-18 ENCOUNTER — Ambulatory Visit (INDEPENDENT_AMBULATORY_CARE_PROVIDER_SITE_OTHER): Payer: Medicare Other

## 2017-07-18 DIAGNOSIS — M7751 Other enthesopathy of right foot: Secondary | ICD-10-CM

## 2017-07-18 DIAGNOSIS — M779 Enthesopathy, unspecified: Secondary | ICD-10-CM

## 2017-07-18 DIAGNOSIS — M778 Other enthesopathies, not elsewhere classified: Secondary | ICD-10-CM

## 2017-07-18 NOTE — Progress Notes (Signed)
She presents today with several week duration of pain to the first metatarsophalangeal joint and to the hallux interphalangeal joint of the right foot.  Date of surgery was 07/17/2013 with an Aiken osteotomy.  She states that is hurts right here she points to the first metatarsal phalangeal joint.  Objective: Pulses are palpable vital signs are stable she is alert and oriented x3.  She has painful range of motion of the first metatarsophalangeal joint and mild plantarflexed hallux malleus.  There is tenderness on palpation of the hallux interphalangeal joint.  Radiographs today demonstrate no major osseous abnormalities in the first metatarsophalangeal joint mild hallux malleus with a rotated stable in the proximal phalanx.  Otherwise proximal phalanx is relatively normal.  It went on to heal uneventfully.  Assessment: Capsulitis first metatarsophalangeal joint right hallux malleus right.  Plan: At this point after sterile Betadine skin prep injected 20 mg of Kenalog 5 mg Marcaine around the joint.  She tolerated procedure well without complications.  I will follow-up with her in 1 month at which time we will consider surgical intervention if necessary this will consist of a release of the first metatarsal phalangeal joint and evaluation as well as a hallux interphalangeal joint fusion.

## 2017-07-25 ENCOUNTER — Other Ambulatory Visit: Payer: Self-pay | Admitting: Surgery

## 2017-07-25 DIAGNOSIS — H469 Unspecified optic neuritis: Secondary | ICD-10-CM

## 2017-08-06 IMAGING — MR MR HEAD W/O CM
10 of 13 series · 34 of 48 positions shown · non-contrast
Comparison: 03/18/2016 CT head

CLINICAL DATA: 55 y/o F; altered mental status with concern for
stroke.

EXAM:
MRI HEAD WITHOUT CONTRAST
MRA HEAD WITHOUT CONTRAST
TECHNIQUE: Multiplanar, multiecho pulse sequences of the brain and surrounding
structures were obtained without intravenous contrast. Angiographic
images of the head were obtained using MRA technique without
contrast.

[Series 3: DWI · axial · 3.0mm · 1.09mm/px · z∈[-59,+85]mm · 8 of 98 slices shown (1 of 4)]
[im 1/98]
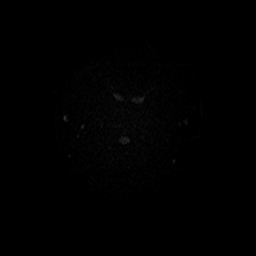
[im 14/98]
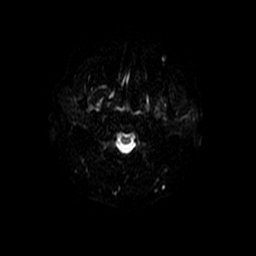
[im 28/98]
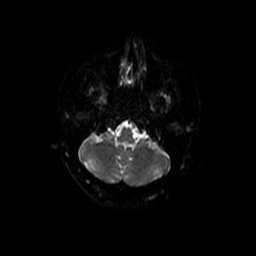
[im 42/98]
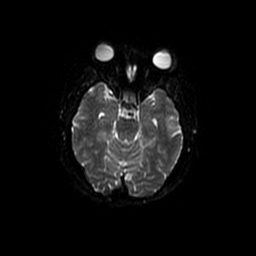
[im 56/98]
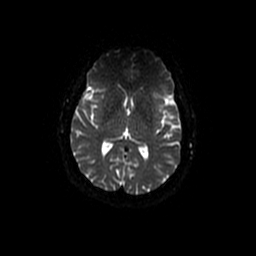
[im 70/98]
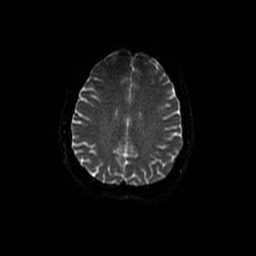
[im 84/98]
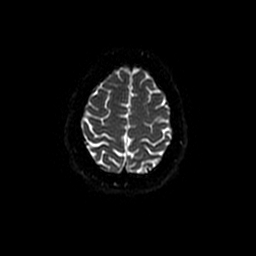
[im 98/98]
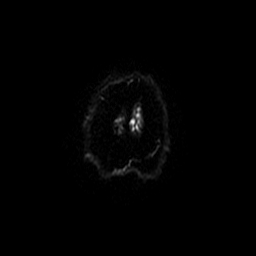

[Series 4: (id) mt fs · axial · 1.4mm · 0.43mm/px · z∈[-35,-18]mm · 2 of 136 slices shown]
[im 1/136]
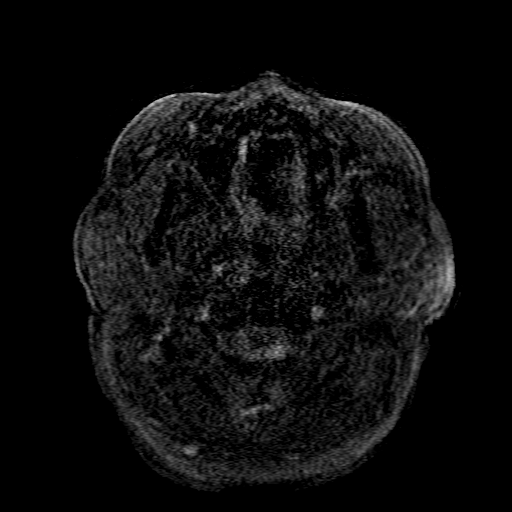
[im 25/136]
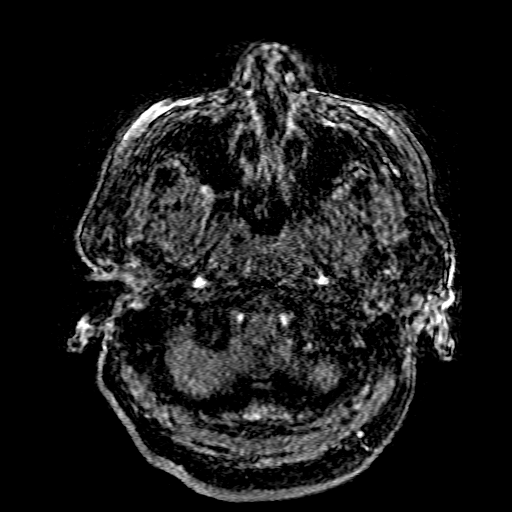

[Series 5: T2 · axial · 5.0mm · 0.43mm/px · z∈[-66,+70]mm · 2 of 24 slices shown (1 of 2)]
[im 1/24]
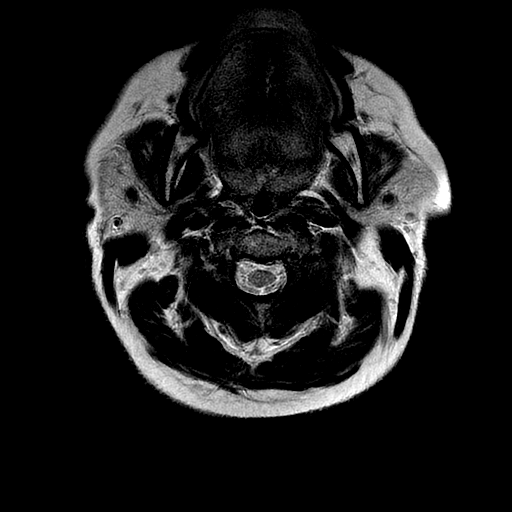
[im 24/24]
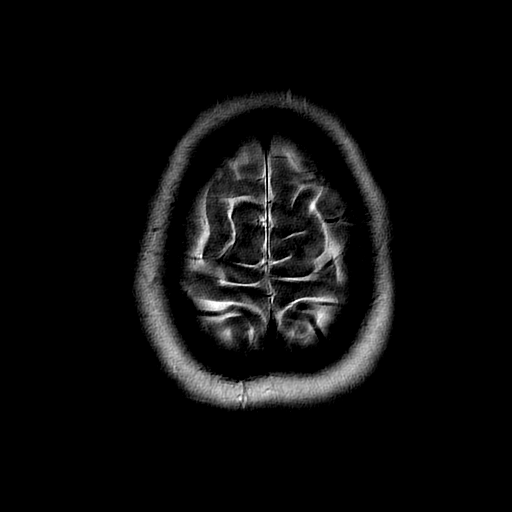

[Series 6: FLAIR · axial · 5.0mm · 0.43mm/px · z∈[-66,+70]mm · 2 of 24 slices shown (1 of 3)]
[im 1/24]
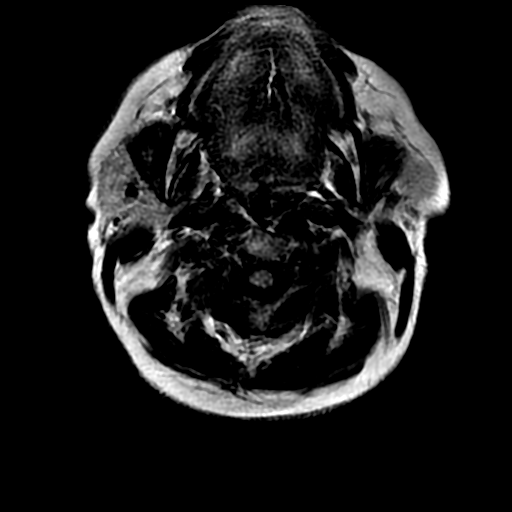
[im 24/24]
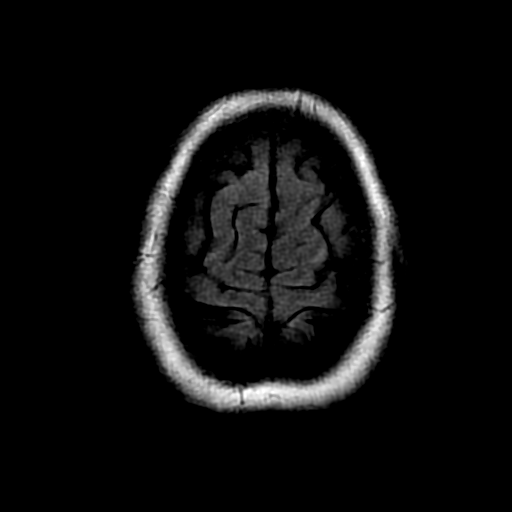

[Series 7: T2 · coronal · 5.0mm · 0.43mm/px · 2 of 27 slices shown (2 of 2)]
[im 1/27]
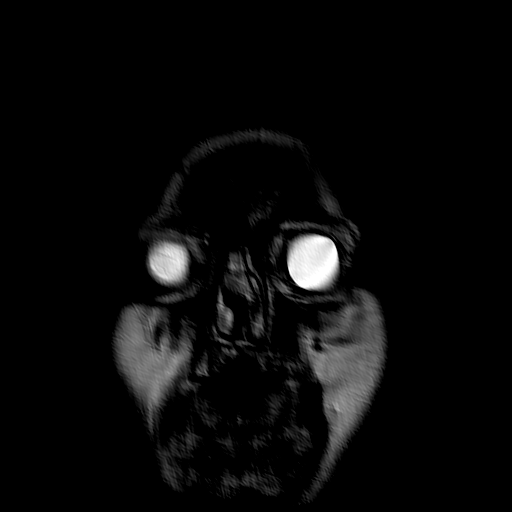
[im 27/27]
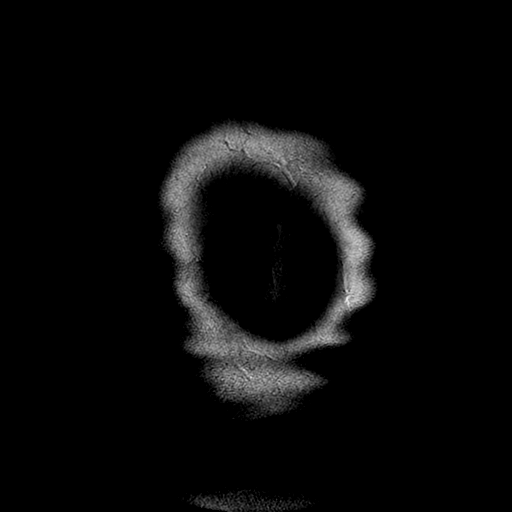

[Series 8: DWI · coronal · 5.0mm · 1.09mm/px · 6 of 62 slices shown (2 of 4)]
[im 1/62]
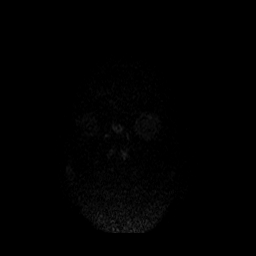
[im 13/62]
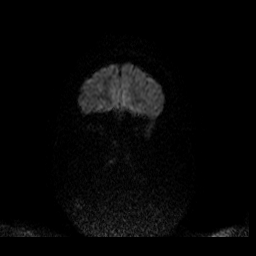
[im 25/62]
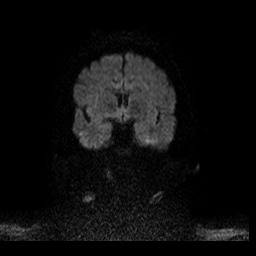
[im 37/62]
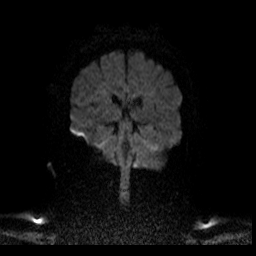
[im 49/62]
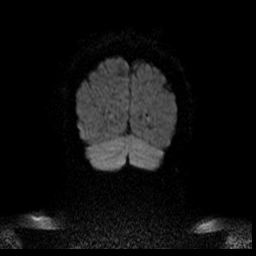
[im 62/62]
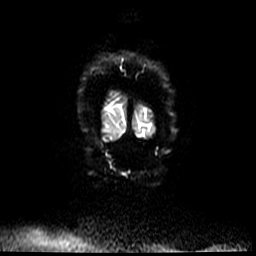

[Series 9: FLAIR · sagittal · 5.0mm · 0.47mm/px · 2 of 21 slices shown (2 of 3)]
[im 1/21]
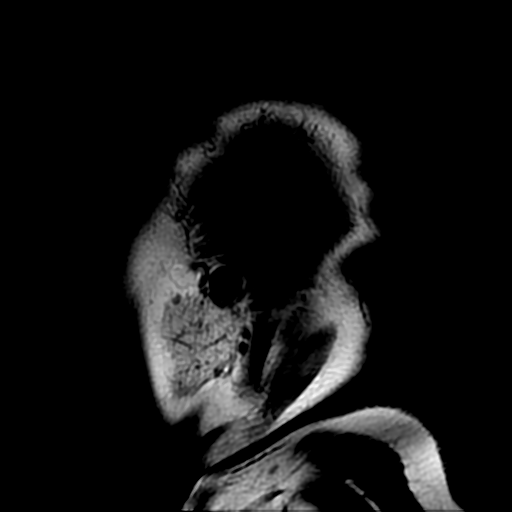
[im 21/21]
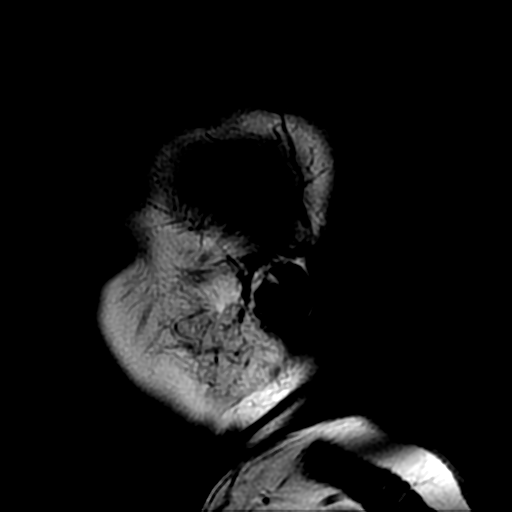

[Series 10: FLAIR · axial · 5.0mm · 0.43mm/px · z∈[-56,+81]mm · 2 of 24 slices shown (3 of 3)]
[im 1/24]
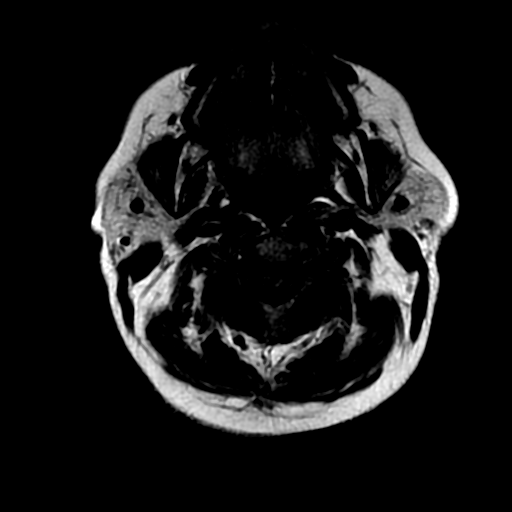
[im 24/24]
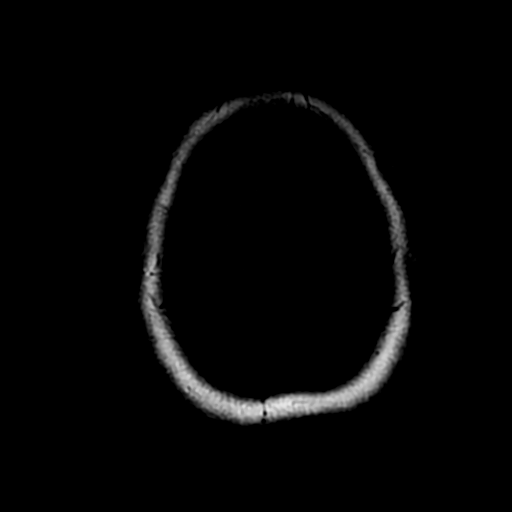

[Series 300: DWI · axial · 3.0mm · 1.09mm/px · z∈[-59,+85]mm · 5 of 49 slices shown (3 of 4)]
[im 1/49]
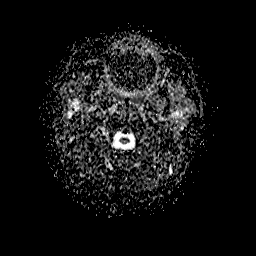
[im 13/49]
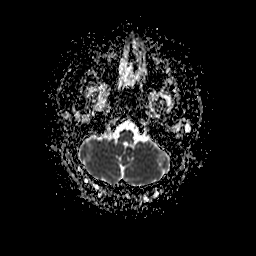
[im 25/49]
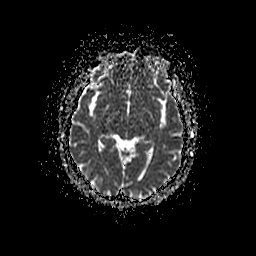
[im 37/49]
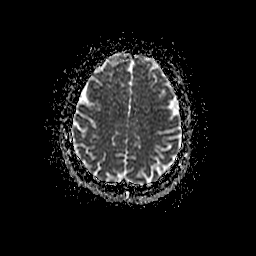
[im 49/49]
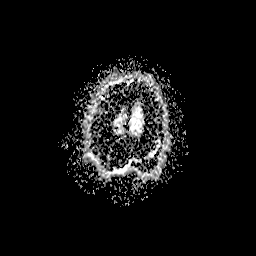

[Series 800: DWI · coronal · 5.0mm · 1.09mm/px · 3 of 31 slices shown (4 of 4)]
[im 1/31]
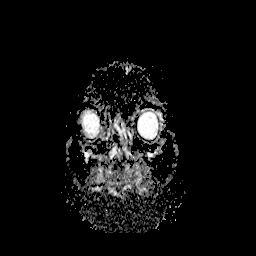
[im 16/31]
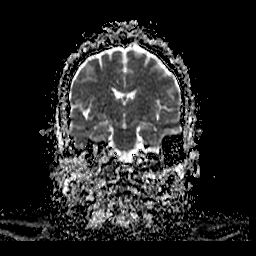
[im 31/31]
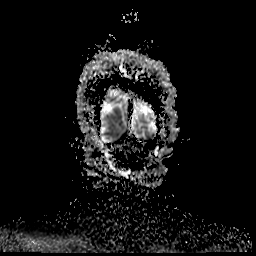

[34 of 48 positions shown; findings below may reference images not displayed]

FINDINGS: MRI HEAD FINDINGS

Moderate motion degradation on multiple pulse sequences.

Brain: No acute infarction, hemorrhage, hydrocephalus, extra-axial
collection or mass lesion.

Vascular: As below.

Skull and upper cervical spine: Normal marrow signal.

Sinuses/Orbits: Ethmoid sinus mucosal thickening. No abnormal signal
of mastoid air cells. Right intra-ocular lens replacement.

Other: Negative.

MRA HEAD FINDINGS

Severe motion degradation of time-of-flight MRA. Suboptimal
evaluation for stenosis or small aneurysms. There is no proximal
large vessel occlusion of the circle of Willis.
IMPRESSION: 1. Motion degraded study.
2. No acute infarction, hemorrhage, hydrocephalus, extra-axial
collection or mass lesion of the brain.
3. No proximal large vessel occlusion of circle of Willis.

By: Rumi Seltzer M.D.

## 2017-08-07 ENCOUNTER — Ambulatory Visit
Admission: RE | Admit: 2017-08-07 | Discharge: 2017-08-07 | Disposition: A | Discharge status: Home / Self Care | Payer: Medicare Other | Source: Ambulatory Visit | Attending: Surgery | Admitting: Surgery

## 2017-08-07 DIAGNOSIS — H469 Unspecified optic neuritis: Secondary | ICD-10-CM

## 2017-08-07 MED ORDER — GADOBENATE DIMEGLUMINE 529 MG/ML IV SOLN
20.0000 mL | Freq: Once | INTRAVENOUS | Status: AC | PRN
  Administered 2017-08-07: 20 mL via INTRAVENOUS

## 2017-08-22 ENCOUNTER — Encounter: Payer: Self-pay | Admitting: Podiatry

## 2017-08-22 ENCOUNTER — Ambulatory Visit (INDEPENDENT_AMBULATORY_CARE_PROVIDER_SITE_OTHER): Payer: Medicare Other | Admitting: Podiatry

## 2017-08-22 DIAGNOSIS — M7751 Other enthesopathy of right foot: Secondary | ICD-10-CM

## 2017-08-22 DIAGNOSIS — M779 Enthesopathy, unspecified: Secondary | ICD-10-CM

## 2017-08-22 DIAGNOSIS — T847XXD Infection and inflammatory reaction due to other internal orthopedic prosthetic devices, implants and grafts, subsequent encounter: Secondary | ICD-10-CM

## 2017-08-22 NOTE — Progress Notes (Signed)
She presents today chief complaint of hallux malleus and hallux interphalangeal right foot.  States that this toe is bothering me the shot helped for little while but it really bothers me she points to the hallux interphalangeal joint.  No changes in her past medical history medications or allergies.  Objective: Vital signs are stable she is alert and oriented x3 pain on palpation and range of motion of the hallux interphalangeal joint of the first metatarsophalangeal joint.  She has tenderness on palpation of the proximal phalanx.  At this point I reviewed the radiographs and it demonstrates a stable in the proximal phalanx and hallux interphalangeal.  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  Plan: Discussed etiology pathology conservative surgical therapies at this point time I went ahead and schedule her for a removal of internal fixation with hallux interphalangeal joint fusion.  She is good with this we did discuss possible postop complications which may include but not limited to postop pain bleeding swelling infection recurrence need further surgery overcorrection under correction.  She signed all 3 pages a consent form she has a Cam walker at home that she will present with her on the day of surgery.  I will follow-up with her in the near future for surgical intervention.

## 2017-08-22 NOTE — Patient Instructions (Signed)
Pre-Operative Instructions  Congratulations, you have decided to take an important step towards improving your quality of life.  You can be assured that the doctors and staff at [facility] will be with you every step of the way.  Here are some important things you should know:  1. Plan to be at the surgery center/hospital at least 1 (one) hour prior to your scheduled time, unless otherwise directed by the surgical center/hospital staff.  You must have a responsible adult accompany you, remain during the surgery and drive you home.  Make sure you have directions to the surgical center/hospital to ensure you arrive on time. 2. If you are having surgery at [facility] or [facility] hospitals, you will need a copy of your medical history and physical form from your family physician within one month prior to the date of surgery. We will give you a form for your primary physician to complete.  3. We make every effort to accommodate the date you request for surgery.  However, there are times where surgery dates or times have to be moved.  We will contact you as soon as possible if a change in schedule is required.   4. No aspirin/ibuprofen for one week before surgery.  If you are on aspirin, any non-steroidal anti-inflammatory medications (Mobic, Aleve, Ibuprofen) should not be taken seven (7) days prior to your surgery.  You make take Tylenol for pain prior to surgery.  5. Medications - If you are taking daily heart and blood pressure medications, seizure, reflux, allergy, asthma, anxiety, pain or diabetes medications, make sure you notify the surgery center/hospital before the day of surgery so they can tell you which medications you should take or avoid the day of surgery. 6. No food or drink after midnight the night before surgery unless directed otherwise by surgical center/hospital staff. 7. No alcoholic beverages 24-hours prior to surgery.  No smoking 24-hours prior or 24-hours after  surgery. 8. Wear loose pants or shorts. They should be loose enough to fit over bandages, boots, and casts. 9. Don't wear slip-on shoes. Sneakers are preferred. 10. Bring your boot with you to the surgery center/hospital.  Also bring crutches or a walker if your physician has prescribed it for you.  If you do not have this equipment, it will be provided for you after surgery. 11. If you have not been contacted by the surgery center/hospital by the day before your surgery, call to confirm the date and time of your surgery. 12. Leave-time from work may vary depending on the type of surgery you have.  Appropriate arrangements should be made prior to surgery with your employer. 13. Prescriptions will be provided immediately following surgery by your doctor.  Fill these as soon as possible after surgery and take the medication as directed. Pain medications will not be refilled on weekends and must be approved by the doctor. 14. Remove nail polish on the operative foot and avoid getting pedicures prior to surgery. 15. Wash the night before surgery.  The night before surgery wash the foot and leg well with water and the antibacterial soap provided. Be sure to pay special attention to beneath the toenails and in between the toes.  Wash for at least three (3) minutes. Rinse thoroughly with water and dry well with a towel.  Perform this wash unless told not to do so by your physician.  Enclosed: 1 Ice pack (please put in freezer the night before surgery)   1 Hibiclens skin cleaner     Pre-op instructions  If you have any questions regarding the instructions, please do not hesitate to call our office.  [place]: [address], [place], [place] [address] -- [phone]  [place]: [address] [place], [place] [address] -- [phone]  [place]: [address]  [place], [place] [address] -- [phone]  [place]: [address], [address], [place], [place] [address] -- [phone]  Website:  [email or web address] 

## 2017-09-18 ENCOUNTER — Telehealth: Payer: Self-pay | Admitting: *Deleted

## 2017-09-18 NOTE — Telephone Encounter (Addendum)
"  I'm scheduled to have foot surgery on August 2.  I need to talk to someone about changing that.  If you would, give me a call."  I'm returning your call.  You wanted to reschedule your surgery.  "Yes, my [birth year].  My daughters had planned a surprise birthday party for me.  They ended up having to tell me.  So I have to reschedule it.  I am going on a cruise for three days so it has to be after August 14."  Dr. Al CorpusHyatt can do it on August 16.  "You gave me plenty of time to readjust.  I'll take it."  Someone from the surgical center will give you a call a day or two prior to your surgery date.  They will give you your arrival time.

## 2017-09-23 DIAGNOSIS — J310 Chronic rhinitis: Secondary | ICD-10-CM | POA: Insufficient documentation

## 2017-09-23 DIAGNOSIS — H6983 Other specified disorders of Eustachian tube, bilateral: Secondary | ICD-10-CM | POA: Insufficient documentation

## 2017-09-23 DIAGNOSIS — H6693 Otitis media, unspecified, bilateral: Secondary | ICD-10-CM | POA: Insufficient documentation

## 2017-09-23 DIAGNOSIS — J343 Hypertrophy of nasal turbinates: Secondary | ICD-10-CM | POA: Insufficient documentation

## 2017-09-23 DIAGNOSIS — J342 Deviated nasal septum: Secondary | ICD-10-CM | POA: Insufficient documentation

## 2017-09-23 DIAGNOSIS — H6993 Unspecified Eustachian tube disorder, bilateral: Secondary | ICD-10-CM | POA: Insufficient documentation

## 2017-09-24 ENCOUNTER — Ambulatory Visit: Payer: Medicare Other | Admitting: Podiatry

## 2017-09-24 ENCOUNTER — Encounter: Payer: Self-pay | Admitting: Podiatry

## 2017-09-24 ENCOUNTER — Ambulatory Visit (INDEPENDENT_AMBULATORY_CARE_PROVIDER_SITE_OTHER): Payer: Medicare Other | Admitting: Podiatry

## 2017-09-24 DIAGNOSIS — Z794 Long term (current) use of insulin: Secondary | ICD-10-CM

## 2017-09-24 DIAGNOSIS — E1140 Type 2 diabetes mellitus with diabetic neuropathy, unspecified: Secondary | ICD-10-CM

## 2017-09-24 DIAGNOSIS — B351 Tinea unguium: Secondary | ICD-10-CM

## 2017-09-24 DIAGNOSIS — M79676 Pain in unspecified toe(s): Secondary | ICD-10-CM | POA: Diagnosis not present

## 2017-09-24 NOTE — Progress Notes (Signed)
Patient ID: Lacey BlowerFrances Y Meyer, female   DOB: 10/01/1960, 57 y.o.   MRN: [id number] Complaint:  Visit Type: Patient returns to my office for continued preventative foot care services. Complaint: Patient states" my nails have grown long and thick and become painful to walk and wear shoes" Patient has been diagnosed with DM with no foot complications. The patient presents for preventative foot care services. No changes to ROS. Patient says her foot is healing.   Podiatric Exam: Vascular: dorsalis pedis and posterior tibial pulses are palpable bilateral. Capillary return is immediate. Temperature gradient is WNL. Skin turgor WNL  Sensorium: Diminished  Semmes Weinstein monofilament test. Normal tactile sensation bilaterally. Nail Exam: Pt has thick disfigured discolored nails with subungual debris noted bilateral entire nail hallux through fifth toenails Ulcer Exam: There is no evidence of ulcer or pre-ulcerative changes or infection. Orthopedic Exam: Muscle tone and strength are WNL. No limitations in general ROM. No crepitus or effusions noted. Foot type and digits show no abnormalities. Bony prominences are unremarkable..Swelling. Skin: No Porokeratosis. No infection or ulcers.    Diagnosis:  Onychomycosis, , Pain in right toe, pain in left toes,    Treatment & Plan Procedures and Treatment: Consent by patient was obtained for treatment procedures. The patient understood the discussion of treatment and procedures well. All questions were answered thoroughly reviewed. Debridement of mycotic and hypertrophic toenails, 1 through 5 bilateral and clearing of subungual debris. No ulceration, no infection noted.  Signed ABN for 2019.  Scheduled for surgery soon by Dr.  Al CorpusHyatt.    Return Visit-Office Procedure: Patient instructed to return to the office for a follow up visit 3 months for continued evaluation and treatment.     Helane GuntherGregory Exavior Kimmons DPM

## 2017-09-24 NOTE — Telephone Encounter (Signed)
ok 

## 2017-10-17 ENCOUNTER — Other Ambulatory Visit: Payer: Medicare Other

## 2017-10-22 ENCOUNTER — Telehealth: Payer: Self-pay | Admitting: *Deleted

## 2017-10-22 NOTE — Telephone Encounter (Signed)
"  I need to reschedule my surgery from this Friday to next Friday."  He doesn't have anything available on next week.  His next available is September 27.  "Well keep it for this week.  Leave it as it is.  I'm supposed to get an injection in that knee, same side on Monday.  Will that be a problem?"  I don't think it will.  I'll ask Dr. Al CorpusHyatt.  "Okay, thank you."

## 2017-10-22 NOTE — Telephone Encounter (Signed)
If the injection is cortisone I'd rather wait for surgery.  If another injection then that will be ok.

## 2017-10-22 NOTE — Telephone Encounter (Signed)
Ok

## 2017-10-22 NOTE — Telephone Encounter (Signed)
I spoke to Dr. Al CorpusHyatt.  He wants to know if the injection is going to be cortisone because if so, he recommends you hold off on having the surgery.  "It's not cortisone.  They are putting a filling in, it's a new procedure.  This is actually my third one.  They fill the space in with gel."  I'll let Dr. Al CorpusHyatt know.

## 2017-10-23 ENCOUNTER — Other Ambulatory Visit: Payer: Self-pay | Admitting: Podiatry

## 2017-10-23 MED ORDER — OXYCODONE-ACETAMINOPHEN 10-325 MG PO TABS
1.0000 | ORAL_TABLET | Freq: Four times a day (QID) | ORAL | 0 refills | Status: AC | PRN
Start: 2017-10-23 — End: 2017-10-30

## 2017-10-23 MED ORDER — CLINDAMYCIN HCL 150 MG PO CAPS: 150.0000 mg | ORAL_CAPSULE | Freq: Three times a day (TID) | ORAL | 0 refills | Status: DC

## 2017-10-23 MED ORDER — ONDANSETRON HCL 4 MG PO TABS: 4.0000 mg | ORAL_TABLET | Freq: Three times a day (TID) | ORAL | 0 refills | Status: DC | PRN

## 2017-10-25 ENCOUNTER — Telehealth: Payer: Self-pay | Admitting: Podiatry

## 2017-10-25 ENCOUNTER — Encounter: Payer: Self-pay | Admitting: Podiatry

## 2017-10-25 DIAGNOSIS — Z4889 Encounter for other specified surgical aftercare: Secondary | ICD-10-CM | POA: Diagnosis not present

## 2017-10-25 DIAGNOSIS — M2031 Hallux varus (acquired), right foot: Secondary | ICD-10-CM | POA: Diagnosis not present

## 2017-10-25 NOTE — Telephone Encounter (Signed)
[facility] I'm calling about pain medicine that was given to the pt. We need to verify this prescription as the pt already filled a 30 day supply this month. Give me a call back at [phone]. Thank you.

## 2017-10-28 ENCOUNTER — Telehealth: Payer: Self-pay | Admitting: Podiatry

## 2017-10-28 ENCOUNTER — Telehealth: Payer: Self-pay | Admitting: *Deleted

## 2017-10-28 NOTE — Telephone Encounter (Signed)
POST OP CALL-    1) General condition stated by the patient: NOT GOOD  2) Is the pt having pain? YES  3) Pain score: 10  4) Has the pt taken Rx'd pain medication, regularly or PRN? PT STATES PHARMACIST WOULDN'T FILL PAIN RX BECAUSE SHE ALREADY TAKES PAIN MEDS FROM PAIN CLINIC  5) Is the pain medication giving relief? NO  6) Any fever, chills, nausea, or vomiting, shortness of breath or tightness in calf? NO  7) Is the bandage clean, dry and intact? YES  8) Is there excessive tightness, bleeding or drainage coming through the bandage? NO  9) Did you understand all of the post op instruction sheet given? YES  10) Any questions or concerns regarding post op care/recovery? I DISCUSSED THE ISSUE OF PAIN MEDS WITH DR HYATT AND HE SAID THERE IS REALLY NOTHING WE COULD DO ON THAT. I ADVISED HER TO TAKE THE ALREADY RX'D NARCOTIC GIVEN BY HER PAIN DOC OR SHE COULD TRY TAKING IBUPROFEN 600MG  WITH 500MG  EXTRA STRENGTH TYLENOL. PT SAID SHE WOULD TRY THAT.    Confirmed POV appointment with patient

## 2017-10-28 NOTE — Telephone Encounter (Signed)
I reviewed 10/23/2017 Orders Only and it states pt could take the Percocet 10/325 as directed for up to 7 days. I informed pt and she states she has been taking the ibuprofen and tylenol and it has helped some. I told pt I would release the Percocet as ordered by Dr. Al CorpusHyatt, but she could not take with Tylenol and pt states understanding.

## 2017-10-28 NOTE — Telephone Encounter (Signed)
Lacey Meyer, pharmacy - [facility] states it doesn't make sense for her to get 20 when she just got 90 oxycodone 10mg . I explained pt had just had foot surgery and would need the additional pain coverage. Lacey Meyer, pharmacy states that is still a very high dose. I told her I would inform Dr. Al CorpusHyatt.

## 2017-10-28 NOTE — Telephone Encounter (Signed)
Pt is in pain and Pharmacy will not release medication because pt is currently taking Oxycodone. Lacey GutterRussel Meyer said pt is taking Ocycodone for a different reason and needs Pharmacy to release medication Dr. Al CorpusHyatt Rx for pain.

## 2017-10-28 NOTE — Telephone Encounter (Signed)
Read the post op care note.

## 2017-10-29 NOTE — Telephone Encounter (Signed)
She already has pain medication from a pain doctor.  We CAN NOT give her anymore than what she is already taking.

## 2017-10-29 NOTE — Telephone Encounter (Signed)
I spoke with pt and she states she is doing okay. I explained that I had mistaken Dr. Geryl RankinsHyatt's orders for the Percocet on 10/23/2017 to be as an addition to the other oxycodone orders due to surgery.

## 2017-10-31 ENCOUNTER — Ambulatory Visit (INDEPENDENT_AMBULATORY_CARE_PROVIDER_SITE_OTHER): Payer: Medicare Other | Admitting: Podiatry

## 2017-10-31 ENCOUNTER — Telehealth: Payer: Self-pay | Admitting: *Deleted

## 2017-10-31 ENCOUNTER — Ambulatory Visit (INDEPENDENT_AMBULATORY_CARE_PROVIDER_SITE_OTHER): Payer: Medicare Other

## 2017-10-31 ENCOUNTER — Ambulatory Visit (HOSPITAL_COMMUNITY)
Admission: RE | Admit: 2017-10-31 | Discharge: 2017-10-31 | Disposition: A | Discharge status: Home / Self Care | Payer: Medicare Other | Source: Ambulatory Visit | Attending: Cardiology | Admitting: Cardiology

## 2017-10-31 VITALS — BP 144/78 | HR 101 | Temp 97.1°F

## 2017-10-31 DIAGNOSIS — T847XXD Infection and inflammatory reaction due to other internal orthopedic prosthetic devices, implants and grafts, subsequent encounter: Secondary | ICD-10-CM | POA: Diagnosis not present

## 2017-10-31 DIAGNOSIS — M7989 Other specified soft tissue disorders: Secondary | ICD-10-CM | POA: Insufficient documentation

## 2017-10-31 DIAGNOSIS — R609 Edema, unspecified: Secondary | ICD-10-CM | POA: Diagnosis not present

## 2017-10-31 DIAGNOSIS — M779 Enthesopathy, unspecified: Secondary | ICD-10-CM

## 2017-10-31 DIAGNOSIS — M7751 Other enthesopathy of right foot: Secondary | ICD-10-CM

## 2017-10-31 DIAGNOSIS — Y838 Other surgical procedures as the cause of abnormal reaction of the patient, or of later complication, without mention of misadventure at the time of the procedure: Secondary | ICD-10-CM | POA: Insufficient documentation

## 2017-10-31 NOTE — Telephone Encounter (Signed)
[facility] - MEDICAID NOTIFICATION STATES, "THIS [place] MEDICAID MEMBER DOES NOT REQUIRE PRIOR AUTHORIZATION FOR OUTPATIENT RADIOLOGY THROUGH [facility] OR [facility] AT THIS TIME." 

## 2017-10-31 NOTE — Telephone Encounter (Signed)
Dr. Al CorpusHyatt orders venous doppler right r/o DVT. Falecha scheduled pt today arrival 11:45am for 12:00pm testing.

## 2017-10-31 NOTE — Telephone Encounter (Signed)
Pt asked if she could take the boot off the rods on the sides rub the leg, and her test was negative for blood clot. I told pt that she could take the boot off to rest, but must be the boot to walk. Pt states understanding.

## 2017-10-31 NOTE — Progress Notes (Signed)
She presents today date of surgery October 25, 2017 removal fixation K wire screw hallux right with hallux interphalangeal joint fusion right.  She states that I am having a lot of pain in my right calf denies shortness of breath chest pain and fever.  Objective: Vital signs are stable she is alert and oriented x3 ambulates today with her cam walker.  Was removed dry sterile dressings intact was removed demonstrates no erythema edema sialitis drainage or odor to the foot itself sutures are intact margins well coapted she has a red erythematous warm and painful calf.  Radiographs demonstrat screw intact to the first proximal phalanx of the right hallux.  Assessment well-healing surgical foot probable DVT right calf.  Plan: Stat ultrasound was ordered for DVT of the right calf follow-up with her in 1 week if she does have a DVT vascular needs to treat her.

## 2017-11-05 NOTE — Telephone Encounter (Signed)
Must wear the boot with ambulation

## 2017-11-05 NOTE — Telephone Encounter (Signed)
Left message for pt to call with status of leg pain, and reminded her Dr. Al CorpusHyatt wanted her in the surgical boot when up walking, and to call with status.

## 2017-11-06 ENCOUNTER — Telehealth: Payer: Self-pay | Admitting: Podiatry

## 2017-11-06 ENCOUNTER — Telehealth: Payer: Self-pay | Admitting: *Deleted

## 2017-11-06 NOTE — Telephone Encounter (Signed)
Pt states her dressing came off in her sock and she now has on a white athletic sock, the foot looks good, no redness, swelling or pus. I told pt that would be fine until she was seen tomorrow and to stay in the surgery boot when walking. Pt states that is what she is doing.

## 2017-11-06 NOTE — Telephone Encounter (Signed)
Pt requested Lacey StanleyLisa or Lacey BumpsJessica to give her a call. She wants to know what to do about her foot since the dressing has came off. Please giver her a call.

## 2017-11-07 ENCOUNTER — Ambulatory Visit (INDEPENDENT_AMBULATORY_CARE_PROVIDER_SITE_OTHER): Payer: Medicare Other

## 2017-11-07 DIAGNOSIS — E1140 Type 2 diabetes mellitus with diabetic neuropathy, unspecified: Secondary | ICD-10-CM | POA: Diagnosis not present

## 2017-11-07 DIAGNOSIS — M2032 Hallux varus (acquired), left foot: Secondary | ICD-10-CM | POA: Diagnosis not present

## 2017-11-07 DIAGNOSIS — Z794 Long term (current) use of insulin: Secondary | ICD-10-CM | POA: Diagnosis not present

## 2017-11-13 ENCOUNTER — Ambulatory Visit: Payer: Medicare Other | Admitting: Podiatry

## 2017-11-13 ENCOUNTER — Ambulatory Visit (INDEPENDENT_AMBULATORY_CARE_PROVIDER_SITE_OTHER): Payer: Medicare Other

## 2017-11-13 DIAGNOSIS — M779 Enthesopathy, unspecified: Secondary | ICD-10-CM

## 2017-11-13 DIAGNOSIS — E1140 Type 2 diabetes mellitus with diabetic neuropathy, unspecified: Secondary | ICD-10-CM

## 2017-11-13 DIAGNOSIS — Z794 Long term (current) use of insulin: Secondary | ICD-10-CM

## 2017-11-13 DIAGNOSIS — M778 Other enthesopathies, not elsewhere classified: Secondary | ICD-10-CM

## 2017-11-13 DIAGNOSIS — M2012 Hallux valgus (acquired), left foot: Secondary | ICD-10-CM

## 2017-11-13 NOTE — Progress Notes (Signed)
Patient is here today for follow-up appointment post surgery date of surgery 10/25/2017, procedure performed removal fixation K wire screw hallux right with hallux interphalangeal joint fusion right foot.  She states that her pain is continuing to improve, although she did trip over to yesterday.  There is no pain to report from the fall.  Well-healing surgical sites, minimal swelling to right hallux time and plantar forefoot.  Sutures remain and wound edges coapted, no gapping noted.  No erythema, no redness, no drainage, no other signs and symptoms of infection.  She is to follow-up next week for suture removal or sooner if any complications arise.

## 2017-11-25 DIAGNOSIS — H903 Sensorineural hearing loss, bilateral: Secondary | ICD-10-CM | POA: Insufficient documentation

## 2017-11-28 ENCOUNTER — Ambulatory Visit (INDEPENDENT_AMBULATORY_CARE_PROVIDER_SITE_OTHER): Payer: Medicare Other

## 2017-11-28 ENCOUNTER — Encounter: Payer: Self-pay | Admitting: Podiatry

## 2017-11-28 ENCOUNTER — Ambulatory Visit (INDEPENDENT_AMBULATORY_CARE_PROVIDER_SITE_OTHER): Payer: Medicare Other | Admitting: Podiatry

## 2017-11-28 DIAGNOSIS — M2031 Hallux varus (acquired), right foot: Secondary | ICD-10-CM | POA: Diagnosis not present

## 2017-11-28 DIAGNOSIS — T847XXD Infection and inflammatory reaction due to other internal orthopedic prosthetic devices, implants and grafts, subsequent encounter: Secondary | ICD-10-CM

## 2017-11-28 DIAGNOSIS — Z9889 Other specified postprocedural states: Secondary | ICD-10-CM

## 2017-11-28 NOTE — Progress Notes (Signed)
She presents today for postop visit date of surgery 10/25/2017 hallux interphalangeal joint fusion right foot.  She states is been sore recently but I cannot get past the neuropathy.  Objective: Vital signs are stable alert and oriented x3.  Pulses are palpable.  She has minimal edema no erythema cellulitis drainage or odor radiographs demonstrate well healing hallux interphalangeal joint fusion.  Assessment: Resolving surgical soreness.  Plan: Encouraged her to continue to wear the Darco shoe for the next several weeks dispensed a new Darco shoe today.  Follow-up with her in about 4 weeks

## 2017-12-25 ENCOUNTER — Ambulatory Visit: Payer: Medicare Other | Admitting: Podiatry

## 2017-12-26 ENCOUNTER — Encounter: Payer: Self-pay | Admitting: Podiatry

## 2017-12-26 ENCOUNTER — Ambulatory Visit (INDEPENDENT_AMBULATORY_CARE_PROVIDER_SITE_OTHER): Payer: Medicare Other | Admitting: Podiatry

## 2017-12-26 ENCOUNTER — Ambulatory Visit (INDEPENDENT_AMBULATORY_CARE_PROVIDER_SITE_OTHER): Payer: Medicare Other

## 2017-12-26 DIAGNOSIS — T847XXD Infection and inflammatory reaction due to other internal orthopedic prosthetic devices, implants and grafts, subsequent encounter: Secondary | ICD-10-CM

## 2017-12-26 DIAGNOSIS — Z9889 Other specified postprocedural states: Secondary | ICD-10-CM

## 2017-12-26 DIAGNOSIS — B351 Tinea unguium: Secondary | ICD-10-CM | POA: Diagnosis not present

## 2017-12-26 DIAGNOSIS — M2031 Hallux varus (acquired), right foot: Secondary | ICD-10-CM | POA: Diagnosis not present

## 2017-12-26 DIAGNOSIS — M79676 Pain in unspecified toe(s): Secondary | ICD-10-CM

## 2017-12-26 NOTE — Progress Notes (Signed)
She presents today for postop visit on October 25, 2017 removal of internal fixation with fusion of the hallux interphalangeal joint of the right foot.  States that is been hurting last night but doing a lot better today.  Objective: Vital signs are stable she is alert and oriented x3 hallux right still demonstrates some swelling but radiographically appears to be developing fusion across the surgical site.  Screw is in good position.  Toenails are long thick yellow dystrophic-like mycotic.  Assessment: Well-healing surgical toe.  Pain in limb secondary to onychomycosis.  Plan: Debridement of toenails 1 through 5 bilateral recommend she continue the use of the Darco shoe and try to get into her tennis shoe as well.  Follow-up with her in 6 to 8 weeks.

## 2018-01-30 ENCOUNTER — Encounter: Payer: Medicare Other | Admitting: Podiatry

## 2018-02-14 NOTE — Progress Notes (Signed)
This encounter was created in error - please disregard.

## 2018-05-28 ENCOUNTER — Encounter: Payer: Self-pay | Admitting: Podiatry

## 2018-05-28 ENCOUNTER — Other Ambulatory Visit: Payer: Self-pay

## 2018-05-28 ENCOUNTER — Ambulatory Visit (INDEPENDENT_AMBULATORY_CARE_PROVIDER_SITE_OTHER): Payer: Medicare Other | Admitting: Podiatry

## 2018-05-28 DIAGNOSIS — B351 Tinea unguium: Secondary | ICD-10-CM

## 2018-05-28 DIAGNOSIS — M79676 Pain in unspecified toe(s): Secondary | ICD-10-CM

## 2018-05-28 DIAGNOSIS — E1140 Type 2 diabetes mellitus with diabetic neuropathy, unspecified: Secondary | ICD-10-CM

## 2018-05-28 DIAGNOSIS — Z794 Long term (current) use of insulin: Secondary | ICD-10-CM

## 2018-05-28 NOTE — Progress Notes (Signed)
Complaint:  Visit Type: Patient returns to my office for continued preventative foot care services. Complaint: Patient states" my nails have grown long and thick and become painful to walk and wear shoes" Patient has been diagnosed with DM with no foot complications. The patient presents for preventative foot care services. No changes to ROS.  Patient has had surgery with Dr.  Al Corpus.  Podiatric Exam: Vascular: dorsalis pedis and posterior tibial pulses are palpable bilateral. Capillary return is immediate. Temperature gradient is WNL. Skin turgor WNL  Sensorium: Diminished  Semmes Weinstein monofilament test. Normal tactile sensation bilaterally. Nail Exam: Pt has thick disfigured discolored nails with subungual debris noted bilateral entire nail hallux through fifth toenails Ulcer Exam: There is no evidence of ulcer or pre-ulcerative changes or infection. Orthopedic Exam: Muscle tone and strength are WNL. No limitations in general ROM. No crepitus or effusions noted. Foot type and digits show no abnormalities. Bony prominences are unremarkable. Skin: No Porokeratosis. No infection or ulcers  Diagnosis:  Onychomycosis, , Pain in right toe, pain in left toes  Treatment & Plan Procedures and Treatment: Consent by patient was obtained for treatment procedures.   Debridement of mycotic and hypertrophic toenails, 1 through 5 bilateral and clearing of subungual debris. No ulceration, no infection noted.  Return Visit-Office Procedure: Patient instructed to return to the office for a follow up visit 3 months for continued evaluation and treatment.    Helane Gunther DPM

## 2018-08-27 ENCOUNTER — Ambulatory Visit (INDEPENDENT_AMBULATORY_CARE_PROVIDER_SITE_OTHER): Payer: Medicare Other | Admitting: Podiatry

## 2018-08-27 ENCOUNTER — Other Ambulatory Visit: Payer: Self-pay

## 2018-08-27 ENCOUNTER — Encounter: Payer: Self-pay | Admitting: Podiatry

## 2018-08-27 DIAGNOSIS — M79674 Pain in right toe(s): Secondary | ICD-10-CM | POA: Diagnosis not present

## 2018-08-27 DIAGNOSIS — E669 Obesity, unspecified: Secondary | ICD-10-CM

## 2018-08-27 DIAGNOSIS — E1169 Type 2 diabetes mellitus with other specified complication: Secondary | ICD-10-CM

## 2018-08-27 DIAGNOSIS — M79675 Pain in left toe(s): Secondary | ICD-10-CM | POA: Diagnosis not present

## 2018-08-27 DIAGNOSIS — B351 Tinea unguium: Secondary | ICD-10-CM | POA: Diagnosis not present

## 2018-08-27 NOTE — Progress Notes (Signed)
Complaint:  Visit Type: Patient returns to my office for continued preventative foot care services. Complaint: Patient states" my nails have grown long and thick and become painful to walk and wear shoes" Patient has been diagnosed with DM with no foot complications. The patient presents for preventative foot care services. No changes to ROS.  Patient has had surgery with Dr.  Al Corpus.  Podiatric Exam: Vascular: dorsalis pedis and posterior tibial pulses are palpable bilateral. Capillary return is immediate. Temperature gradient is WNL. Skin turgor WNL  Sensorium: Diminished  Semmes Weinstein monofilament test. Normal tactile sensation bilaterally. Nail Exam: Pt has thick disfigured discolored nails with subungual debris noted bilateral entire nail hallux through fifth toenails Ulcer Exam: There is no evidence of ulcer or pre-ulcerative changes or infection. Orthopedic Exam: Muscle tone and strength are WNL. No limitations in general ROM. No crepitus or effusions noted. Foot type and digits show no abnormalities. Bony prominences are unremarkable. Skin: No Porokeratosis. No infection or ulcers  Diagnosis:  Onychomycosis, , Pain in right toe, pain in left toes  Treatment & Plan Procedures and Treatment: Consent by patient was obtained for treatment procedures.   Debridement of mycotic and hypertrophic toenails, 1 through 5 bilateral and clearing of subungual debris. No ulceration, no infection noted.  Return Visit-Office Procedure: Patient instructed to return to the office for a follow up visit 3 months for continued evaluation and treatment.    Helane Gunther DPM

## 2018-11-07 ENCOUNTER — Other Ambulatory Visit: Payer: Self-pay

## 2018-11-07 ENCOUNTER — Emergency Department: Payer: Medicare Other

## 2018-11-07 ENCOUNTER — Inpatient Hospital Stay
Admission: EM | Admit: 2018-11-07 | Discharge: 2018-11-10 | DRG: 871 | Disposition: A | Discharge status: Home Health | Payer: Medicare Other | Attending: Internal Medicine | Admitting: Internal Medicine

## 2018-11-07 ENCOUNTER — Encounter: Payer: Self-pay | Admitting: Emergency Medicine

## 2018-11-07 DIAGNOSIS — A419 Sepsis, unspecified organism: Principal | ICD-10-CM | POA: Diagnosis present

## 2018-11-07 DIAGNOSIS — G9341 Metabolic encephalopathy: Secondary | ICD-10-CM | POA: Diagnosis present

## 2018-11-07 DIAGNOSIS — H547 Unspecified visual loss: Secondary | ICD-10-CM | POA: Diagnosis present

## 2018-11-07 DIAGNOSIS — E1143 Type 2 diabetes mellitus with diabetic autonomic (poly)neuropathy: Secondary | ICD-10-CM | POA: Diagnosis present

## 2018-11-07 DIAGNOSIS — R0603 Acute respiratory distress: Secondary | ICD-10-CM | POA: Diagnosis present

## 2018-11-07 DIAGNOSIS — R0902 Hypoxemia: Secondary | ICD-10-CM | POA: Diagnosis present

## 2018-11-07 DIAGNOSIS — Z20828 Contact with and (suspected) exposure to other viral communicable diseases: Secondary | ICD-10-CM | POA: Diagnosis present

## 2018-11-07 DIAGNOSIS — I10 Essential (primary) hypertension: Secondary | ICD-10-CM | POA: Diagnosis present

## 2018-11-07 DIAGNOSIS — J189 Pneumonia, unspecified organism: Secondary | ICD-10-CM | POA: Diagnosis present

## 2018-11-07 DIAGNOSIS — Z9884 Bariatric surgery status: Secondary | ICD-10-CM

## 2018-11-07 DIAGNOSIS — K219 Gastro-esophageal reflux disease without esophagitis: Secondary | ICD-10-CM | POA: Diagnosis present

## 2018-11-07 DIAGNOSIS — Z7951 Long term (current) use of inhaled steroids: Secondary | ICD-10-CM

## 2018-11-07 DIAGNOSIS — F329 Major depressive disorder, single episode, unspecified: Secondary | ICD-10-CM | POA: Diagnosis present

## 2018-11-07 DIAGNOSIS — Z880 Allergy status to penicillin: Secondary | ICD-10-CM

## 2018-11-07 DIAGNOSIS — Z79899 Other long term (current) drug therapy: Secondary | ICD-10-CM | POA: Diagnosis not present

## 2018-11-07 DIAGNOSIS — B9620 Unspecified Escherichia coli [E. coli] as the cause of diseases classified elsewhere: Secondary | ICD-10-CM | POA: Diagnosis present

## 2018-11-07 DIAGNOSIS — Z794 Long term (current) use of insulin: Secondary | ICD-10-CM | POA: Diagnosis not present

## 2018-11-07 DIAGNOSIS — Z87891 Personal history of nicotine dependence: Secondary | ICD-10-CM

## 2018-11-07 DIAGNOSIS — J310 Chronic rhinitis: Secondary | ICD-10-CM | POA: Diagnosis present

## 2018-11-07 DIAGNOSIS — Z881 Allergy status to other antibiotic agents status: Secondary | ICD-10-CM | POA: Diagnosis not present

## 2018-11-07 DIAGNOSIS — E1165 Type 2 diabetes mellitus with hyperglycemia: Secondary | ICD-10-CM | POA: Diagnosis present

## 2018-11-07 DIAGNOSIS — K3184 Gastroparesis: Secondary | ICD-10-CM | POA: Diagnosis present

## 2018-11-07 DIAGNOSIS — N3000 Acute cystitis without hematuria: Secondary | ICD-10-CM | POA: Diagnosis present

## 2018-11-07 DIAGNOSIS — B961 Klebsiella pneumoniae [K. pneumoniae] as the cause of diseases classified elsewhere: Secondary | ICD-10-CM | POA: Diagnosis present

## 2018-11-07 DIAGNOSIS — Z8249 Family history of ischemic heart disease and other diseases of the circulatory system: Secondary | ICD-10-CM

## 2018-11-07 DIAGNOSIS — M109 Gout, unspecified: Secondary | ICD-10-CM | POA: Diagnosis present

## 2018-11-07 DIAGNOSIS — Z888 Allergy status to other drugs, medicaments and biological substances status: Secondary | ICD-10-CM

## 2018-11-07 LAB — CBC WITH DIFFERENTIAL/PLATELET
Abs Immature Granulocytes: 0.02 10*3/uL (ref 0.00–0.07)
Basophils Absolute: 0 10*3/uL (ref 0.0–0.1)
Basophils Relative: 1 %
Eosinophils Absolute: 0.1 10*3/uL (ref 0.0–0.5)
Eosinophils Relative: 2 %
HCT: 38.3 % (ref 36.0–46.0)
Hemoglobin: 12.3 g/dL (ref 12.0–15.0)
Immature Granulocytes: 0 %
Lymphocytes Relative: 18 %
Lymphs Abs: 0.9 10*3/uL (ref 0.7–4.0)
MCH: 28.1 pg (ref 26.0–34.0)
MCHC: 32.1 g/dL (ref 30.0–36.0)
MCV: 87.6 fL (ref 80.0–100.0)
Monocytes Absolute: 0.2 10*3/uL (ref 0.1–1.0)
Monocytes Relative: 3 %
Neutro Abs: 3.8 10*3/uL (ref 1.7–7.7)
Neutrophils Relative %: 76 %
Platelets: 111 10*3/uL — ABNORMAL LOW (ref 150–400)
RBC: 4.37 MIL/uL (ref 3.87–5.11)
RDW: 15.1 % (ref 11.5–15.5)
WBC: 5 10*3/uL (ref 4.0–10.5)
nRBC: 0 % (ref 0.0–0.2)

## 2018-11-07 LAB — BASIC METABOLIC PANEL
Anion gap: 13 (ref 5–15)
BUN: 18 mg/dL (ref 6–20)
CO2: 19 mmol/L — ABNORMAL LOW (ref 22–32)
Calcium: 7.8 mg/dL — ABNORMAL LOW (ref 8.9–10.3)
Chloride: 108 mmol/L (ref 98–111)
Creatinine, Ser: 1.13 mg/dL — ABNORMAL HIGH (ref 0.44–1.00)
GFR calc Af Amer: >60 mL/min (ref >60)
GFR calc non Af Amer: 53 mL/min — ABNORMAL LOW (ref >60)
Glucose, Bld: 285 mg/dL — ABNORMAL HIGH (ref 70–99)
Potassium: 5 mmol/L (ref 3.5–5.1)
Sodium: 140 mmol/L (ref 135–145)

## 2018-11-07 LAB — URINALYSIS, ROUTINE W REFLEX MICROSCOPIC
Bilirubin Urine: NEGATIVE
Glucose, UA: >=500 mg/dL — AB
Ketones, ur: NEGATIVE mg/dL
Nitrite: POSITIVE — AB
Protein, ur: 100 mg/dL — AB
Specific Gravity, Urine: 1.02 (ref 1.005–1.030)
WBC, UA: >50 WBC/hpf — ABNORMAL HIGH (ref 0–5)
pH: 5 (ref 5.0–8.0)

## 2018-11-07 LAB — PROCALCITONIN: Procalcitonin: 15.29 ng/mL

## 2018-11-07 LAB — SARS CORONAVIRUS 2 BY RT PCR (HOSPITAL ORDER, PERFORMED IN ~~LOC~~ HOSPITAL LAB): SARS Coronavirus 2: NEGATIVE

## 2018-11-07 LAB — COMPREHENSIVE METABOLIC PANEL
ALT: 31 U/L (ref 0–44)
AST: 40 U/L (ref 15–41)
Albumin: 4.1 g/dL (ref 3.5–5.0)
Alkaline Phosphatase: 104 U/L (ref 38–126)
Anion gap: 16 — ABNORMAL HIGH (ref 5–15)
BUN: 19 mg/dL (ref 6–20)
CO2: 23 mmol/L (ref 22–32)
Calcium: 9.2 mg/dL (ref 8.9–10.3)
Chloride: 96 mmol/L — ABNORMAL LOW (ref 98–111)
Creatinine, Ser: 1.21 mg/dL — ABNORMAL HIGH (ref 0.44–1.00)
GFR calc Af Amer: 56 mL/min — ABNORMAL LOW (ref >60)
GFR calc non Af Amer: 49 mL/min — ABNORMAL LOW (ref >60)
Glucose, Bld: 353 mg/dL — ABNORMAL HIGH (ref 70–99)
Potassium: 5 mmol/L (ref 3.5–5.1)
Sodium: 135 mmol/L (ref 135–145)
Total Bilirubin: 0.7 mg/dL (ref 0.3–1.2)
Total Protein: 8.4 g/dL — ABNORMAL HIGH (ref 6.5–8.1)

## 2018-11-07 LAB — MRSA PCR SCREENING: MRSA by PCR: NEGATIVE

## 2018-11-07 LAB — LACTIC ACID, PLASMA
Lactic Acid, Venous: 5 mmol/L (ref 0.5–1.9)
Lactic Acid, Venous: 3.5 mmol/L (ref 0.5–1.9)
Lactic Acid, Venous: 2.9 mmol/L (ref 0.5–1.9)
Lactic Acid, Venous: 3.9 mmol/L (ref 0.5–1.9)
Lactic Acid, Venous: 2.7 mmol/L (ref 0.5–1.9)

## 2018-11-07 LAB — CBC
HCT: 32.2 % — ABNORMAL LOW (ref 36.0–46.0)
Hemoglobin: 10.4 g/dL — ABNORMAL LOW (ref 12.0–15.0)
MCH: 28.7 pg (ref 26.0–34.0)
MCHC: 32.3 g/dL (ref 30.0–36.0)
MCV: 88.7 fL (ref 80.0–100.0)
Platelets: 108 10*3/uL — ABNORMAL LOW (ref 150–400)
RBC: 3.63 MIL/uL — ABNORMAL LOW (ref 3.87–5.11)
RDW: 15.1 % (ref 11.5–15.5)
WBC: 6.8 10*3/uL (ref 4.0–10.5)
nRBC: 0 % (ref 0.0–0.2)

## 2018-11-07 LAB — PROTIME-INR
INR: 1.2 (ref 0.8–1.2)
INR: 1.2 (ref 0.8–1.2)
Prothrombin Time: 14.6 seconds (ref 11.4–15.2)
Prothrombin Time: 15.3 seconds — ABNORMAL HIGH (ref 11.4–15.2)

## 2018-11-07 LAB — GLUCOSE, CAPILLARY
Glucose-Capillary: 269 mg/dL — ABNORMAL HIGH (ref 70–99)
Glucose-Capillary: 243 mg/dL — ABNORMAL HIGH (ref 70–99)
Glucose-Capillary: 294 mg/dL — ABNORMAL HIGH (ref 70–99)
Glucose-Capillary: 265 mg/dL — ABNORMAL HIGH (ref 70–99)
Glucose-Capillary: 257 mg/dL — ABNORMAL HIGH (ref 70–99)

## 2018-11-07 LAB — HEMOGLOBIN A1C
Hgb A1c MFr Bld: 10.1 % — ABNORMAL HIGH (ref 4.8–5.6)
Mean Plasma Glucose: 243.17 mg/dL

## 2018-11-07 LAB — APTT: aPTT: 25 seconds (ref 24–36)

## 2018-11-07 LAB — CORTISOL-AM, BLOOD: Cortisol - AM: 7.4 ug/dL (ref 6.7–22.6)

## 2018-11-07 MED ORDER — SODIUM CHLORIDE 0.9 % IV SOLN: 2.0000 g | Freq: Once | INTRAVENOUS | Status: DC

## 2018-11-07 MED ORDER — ACETAMINOPHEN 325 MG PO TABS
650.0000 mg | ORAL_TABLET | Freq: Four times a day (QID) | ORAL | Status: DC | PRN
  Administered 2018-11-07 – 2018-11-09 (×2): 650 mg via ORAL
  Filled 2018-11-07 (×2): qty 2

## 2018-11-07 MED ORDER — CHLORHEXIDINE GLUCONATE CLOTH 2 % EX PADS
6.0000 | MEDICATED_PAD | Freq: Every day | CUTANEOUS | Status: DC
  Administered 2018-11-07: 6 via TOPICAL

## 2018-11-07 MED ORDER — PREGABALIN 75 MG PO CAPS
200.0000 mg | ORAL_CAPSULE | Freq: Three times a day (TID) | ORAL | Status: DC
  Administered 2018-11-07 – 2018-11-10 (×10): 200 mg via ORAL
  Filled 2018-11-07 (×10): qty 1

## 2018-11-07 MED ORDER — ONDANSETRON HCL 4 MG PO TABS: 4.0000 mg | ORAL_TABLET | Freq: Four times a day (QID) | ORAL | Status: DC | PRN

## 2018-11-07 MED ORDER — METRONIDAZOLE IN NACL 5-0.79 MG/ML-% IV SOLN
500.0000 mg | Freq: Three times a day (TID) | INTRAVENOUS | Status: DC
  Administered 2018-11-07 – 2018-11-10 (×8): 500 mg via INTRAVENOUS
  Filled 2018-11-07 (×13): qty 100

## 2018-11-07 MED ORDER — SODIUM CHLORIDE 0.9 % IV BOLUS (SEPSIS)
1000.0000 mL | Freq: Once | INTRAVENOUS | Status: AC
  Administered 2018-11-07: 1000 mL via INTRAVENOUS

## 2018-11-07 MED ORDER — SODIUM CHLORIDE 0.9% FLUSH
3.0000 mL | Freq: Two times a day (BID) | INTRAVENOUS | Status: DC
  Administered 2018-11-07 – 2018-11-10 (×5): 3 mL via INTRAVENOUS

## 2018-11-07 MED ORDER — VANCOMYCIN HCL IN DEXTROSE 1-5 GM/200ML-% IV SOLN: 1000.0000 mg | Freq: Once | INTRAVENOUS | Status: DC

## 2018-11-07 MED ORDER — SODIUM CHLORIDE 0.9 % IV SOLN
1000.0000 mL | INTRAVENOUS | Status: DC
  Administered 2018-11-07: 1000 mL via INTRAVENOUS

## 2018-11-07 MED ORDER — METFORMIN HCL 500 MG PO TABS: 500.0000 mg | ORAL_TABLET | Freq: Two times a day (BID) | ORAL | Status: DC

## 2018-11-07 MED ORDER — METOPROLOL SUCCINATE ER 50 MG PO TB24
50.0000 mg | ORAL_TABLET | Freq: Every day | ORAL | Status: DC
  Administered 2018-11-07 – 2018-11-10 (×4): 50 mg via ORAL
  Filled 2018-11-07 (×4): qty 1

## 2018-11-07 MED ORDER — SODIUM CHLORIDE 0.9 % IV BOLUS (SEPSIS)
1000.0000 mL | Freq: Once | INTRAVENOUS | Status: AC
  Administered 2018-11-07: 03:00:00 1000 mL via INTRAVENOUS

## 2018-11-07 MED ORDER — ACETAMINOPHEN 650 MG RE SUPP: 650.0000 mg | Freq: Four times a day (QID) | RECTAL | Status: DC | PRN

## 2018-11-07 MED ORDER — SODIUM CHLORIDE 0.9 % IV SOLN
2.0000 g | Freq: Three times a day (TID) | INTRAVENOUS | Status: DC
  Administered 2018-11-07 – 2018-11-10 (×9): 2 g via INTRAVENOUS
  Filled 2018-11-07 (×12): qty 2

## 2018-11-07 MED ORDER — MELOXICAM 7.5 MG PO TABS
7.5000 mg | ORAL_TABLET | Freq: Every day | ORAL | Status: DC
  Administered 2018-11-07 – 2018-11-10 (×4): 7.5 mg via ORAL
  Filled 2018-11-07 (×4): qty 1

## 2018-11-07 MED ORDER — IPRATROPIUM-ALBUTEROL 0.5-2.5 (3) MG/3ML IN SOLN: 3.0000 mL | Freq: Four times a day (QID) | RESPIRATORY_TRACT | Status: DC | PRN

## 2018-11-07 MED ORDER — FLUTICASONE PROPIONATE 50 MCG/ACT NA SUSP
1.0000 | Freq: Every day | NASAL | Status: DC
  Administered 2018-11-08: 1 via NASAL
  Filled 2018-11-07: qty 16

## 2018-11-07 MED ORDER — SODIUM CHLORIDE 0.9 % IV SOLN
INTRAVENOUS | Status: DC
  Administered 2018-11-08 (×2): via INTRAVENOUS

## 2018-11-07 MED ORDER — DULOXETINE HCL 30 MG PO CPEP
60.0000 mg | ORAL_CAPSULE | Freq: Every day | ORAL | Status: DC
  Administered 2018-11-07 – 2018-11-10 (×4): 60 mg via ORAL
  Filled 2018-11-07 (×4): qty 2

## 2018-11-07 MED ORDER — ALLOPURINOL 300 MG PO TABS
300.0000 mg | ORAL_TABLET | Freq: Every day | ORAL | Status: DC
  Administered 2018-11-07 – 2018-11-10 (×4): 300 mg via ORAL
  Filled 2018-11-07 (×4): qty 1

## 2018-11-07 MED ORDER — ENOXAPARIN SODIUM 40 MG/0.4ML ~~LOC~~ SOLN
40.0000 mg | Freq: Two times a day (BID) | SUBCUTANEOUS | Status: DC
  Administered 2018-11-07 – 2018-11-09 (×6): 40 mg via SUBCUTANEOUS
  Filled 2018-11-07 (×7): qty 0.4

## 2018-11-07 MED ORDER — METRONIDAZOLE IN NACL 5-0.79 MG/ML-% IV SOLN
500.0000 mg | Freq: Once | INTRAVENOUS | Status: AC
  Administered 2018-11-07: 500 mg via INTRAVENOUS
  Filled 2018-11-07: qty 100

## 2018-11-07 MED ORDER — SODIUM CHLORIDE 0.9 % IV SOLN
2.0000 g | Freq: Once | INTRAVENOUS | Status: AC
  Administered 2018-11-07: 02:00:00 2 g via INTRAVENOUS
  Filled 2018-11-07: qty 2

## 2018-11-07 MED ORDER — SIMVASTATIN 20 MG PO TABS
20.0000 mg | ORAL_TABLET | Freq: Every day | ORAL | Status: DC
  Administered 2018-11-07 – 2018-11-09 (×3): 20 mg via ORAL
  Filled 2018-11-07 (×3): qty 1

## 2018-11-07 MED ORDER — VANCOMYCIN HCL 10 G IV SOLR
2500.0000 mg | Freq: Once | INTRAVENOUS | Status: AC
  Administered 2018-11-07: 02:00:00 2500 mg via INTRAVENOUS
  Filled 2018-11-07: qty 2500

## 2018-11-07 MED ORDER — NORTRIPTYLINE HCL 25 MG PO CAPS
25.0000 mg | ORAL_CAPSULE | Freq: Every day | ORAL | Status: DC
  Administered 2018-11-07 – 2018-11-09 (×3): 25 mg via ORAL
  Filled 2018-11-07 (×4): qty 1

## 2018-11-07 MED ORDER — TRAZODONE HCL 50 MG PO TABS
50.0000 mg | ORAL_TABLET | Freq: Every evening | ORAL | Status: DC | PRN
  Administered 2018-11-07 – 2018-11-08 (×2): 50 mg via ORAL
  Filled 2018-11-07 (×2): qty 1

## 2018-11-07 MED ORDER — PANTOPRAZOLE SODIUM 40 MG PO TBEC
40.0000 mg | DELAYED_RELEASE_TABLET | Freq: Every day | ORAL | Status: DC
  Administered 2018-11-07 – 2018-11-10 (×4): 40 mg via ORAL
  Filled 2018-11-07 (×4): qty 1

## 2018-11-07 MED ORDER — ACETAMINOPHEN 500 MG PO TABS
1000.0000 mg | ORAL_TABLET | Freq: Once | ORAL | Status: AC
  Administered 2018-11-07: 02:00:00 1000 mg via ORAL
  Filled 2018-11-07: qty 2

## 2018-11-07 MED ORDER — INSULIN ASPART 100 UNIT/ML ~~LOC~~ SOLN
0.0000 [IU] | Freq: Three times a day (TID) | SUBCUTANEOUS | Status: DC
  Administered 2018-11-07: 5 [IU] via SUBCUTANEOUS
  Administered 2018-11-07 – 2018-11-08 (×5): 8 [IU] via SUBCUTANEOUS
  Administered 2018-11-09: 5 [IU] via SUBCUTANEOUS
  Administered 2018-11-09: 12:00:00 11 [IU] via SUBCUTANEOUS
  Administered 2018-11-09 – 2018-11-10 (×2): 5 [IU] via SUBCUTANEOUS
  Filled 2018-11-07 (×9): qty 1

## 2018-11-07 MED ORDER — ONDANSETRON HCL 4 MG/2ML IJ SOLN
4.0000 mg | Freq: Four times a day (QID) | INTRAMUSCULAR | Status: DC | PRN
  Administered 2018-11-09 – 2018-11-10 (×2): 4 mg via INTRAVENOUS
  Filled 2018-11-07 (×2): qty 2

## 2018-11-07 MED ORDER — INSULIN ASPART 100 UNIT/ML ~~LOC~~ SOLN
0.0000 [IU] | Freq: Every day | SUBCUTANEOUS | Status: DC
  Administered 2018-11-07 – 2018-11-09 (×3): 3 [IU] via SUBCUTANEOUS
  Filled 2018-11-07 (×3): qty 1

## 2018-11-07 MED ORDER — GLIMEPIRIDE 4 MG PO TABS: 4.0000 mg | ORAL_TABLET | Freq: Every day | ORAL | Status: DC

## 2018-11-07 MED ORDER — SODIUM CHLORIDE 0.9 % IV BOLUS (SEPSIS)
1000.0000 mL | Freq: Once | INTRAVENOUS | Status: AC
  Administered 2018-11-07: 02:00:00 1000 mL via INTRAVENOUS

## 2018-11-07 MED ORDER — BENAZEPRIL HCL 10 MG PO TABS: 10.0000 mg | ORAL_TABLET | Freq: Every day | ORAL | Status: DC

## 2018-11-07 MED ORDER — VANCOMYCIN HCL 10 G IV SOLR: 1750.0000 mg | INTRAVENOUS | Status: DC

## 2018-11-07 NOTE — ED Triage Notes (Signed)
Patient arrives from home after husband noticed altered mental status and fever. Patient is confused upon arrival

## 2018-11-07 NOTE — ED Notes (Signed)
Patient repositioned, blanket and pillow provided. Provided patient with emotional support. Husband remains at bedside. Lights turned off and patient encouraged to get some rest

## 2018-11-07 NOTE — Progress Notes (Signed)
Dr A notified of lactic acid of 2.9. No additional orders given.

## 2018-11-07 NOTE — Progress Notes (Signed)
Pharmacy Antibiotic Note  Lacey Meyer is a 58 y.o. female admitted on 11/07/2018 with sepsis secondary to bilateral pneumonia, and UTI. Patient history significant for diabetes, GERD, hypertension, and anemia. Patient has ampicillin allergy (anaphylaxis) and penicillin allergy (unknown significance/reaction) but had tolerated ceftriaxone and cefotetan in 2017 and 2018. Patient had been given vancomycin, cefepime, and Flagyl for sepsis at the ED. Per AM rounds, vancomycin was discontinued. Patient is still taking cefepime and Flagyl for pneumonia. WBC has been stable and patient is afebrile. Pharmacy has been consulted for cefepime dosing.  Plan: Continue cefepime 2g Q8H. Continue Flagyl 500 mg Q8H.  Height: 5\' 2"  (157.5 cm) Weight: 286 lb 13.1 oz (130.1 kg) IBW/kg (Calculated) : 50.1  Temp (24hrs), Avg:101 F (38.3 C), Min:98.3 F (36.8 C), Max:103.6 F (39.8 C)  Recent Labs  Lab 11/07/18 0059 11/07/18 0310 11/07/18 0555 11/07/18 0858 11/07/18 1111  WBC 5.0  --  6.8  --   --   CREATININE 1.21*  --  1.13*  --   --   LATICACIDVEN 5.0* 3.5*  --  2.9* 3.9*    Estimated Creatinine Clearance: 68.6 mL/min (A) (by C-G formula based on SCr of 1.13 mg/dL (H)).    Allergies  Allergen Reactions  . Ampicillin Anaphylaxis, Hives and Other (See Comments)    Has patient had a PCN reaction causing immediate rash, facial/tongue/throat swelling, SOB or lightheadedness with hypotension: Yes Has patient had a PCN reaction causing severe rash involving mucus membranes or skin necrosis: Yes Has patient had a PCN reaction that required hospitalization Yes Has patient had a PCN reaction occurring within the last 10 years: No If all of the above answers are "NO", then may proceed with Cephalosporin use.   Marland Kitchen Doxycycline Other (See Comments)    unknown  . Penicillins   . Other Swelling, Rash and Other (See Comments)    Blistex    Antimicrobials this admission: vancomycin 08/28 x 1 cefepime  08/28 >> Flagyl 08/28 >>  Dose adjustments this admission: n/a  Microbiology results: 08/28 BCx: pending; no growth <12 hrs  08/28 UCx: pending  08/28 Sputum: pending  0828 MRSA PCR: negative  Thank you for allowing pharmacy to be a part of this patient's care.  Lacey Meyer Dear Lacey Meyer 11/07/2018 1:53 PM

## 2018-11-07 NOTE — Progress Notes (Signed)
Inpatient Diabetes Program Recommendations  AACE/ADA: New Consensus Statement on Inpatient Glycemic Control (2015)  Target Ranges:  Prepandial:   less than 140 mg/dL      Peak postprandial:   less than 180 mg/dL (1-2 hours)      Critically ill patients:  140 - 180 mg/dL   Results for Lacey Meyer, Lacey Meyer (MRN [id number]) as of 11/07/2018 08:23  Ref. Range 11/07/2018 05:17  Glucose-Capillary Latest Ref Range: 70 - 99 mg/dL 269 (H)    Admit: Sepsis with bilateral pneumonia/ UTI  History: DM  Home DM Meds: Xultophy 20 units Daily        Novolog 2-15 units TID       Metformin 500 mg BID        Amaryl 4 mg BID  Current Orders: Novolog Moderate Correction Scale/ SSI (0-15 units) TID AC + HS     MD- Please note that patient takes Xultophy 20 units Daily at home--This medication consists of both Insulin Degludec and Liraglutide  Patient gets 20 units Insulin Degludec with the 20 unit dose  Please consider adding Lantus 15 units Daily to regimen  This would be about 75% total home basal insulin pt gets at home in her daily dose of Xultophy     --Will follow patient during hospitalization--  Wyn Quaker RN, MSN, CDE Diabetes Coordinator Inpatient Glycemic Control Team Team Pager: [phone] (8a-5p)

## 2018-11-07 NOTE — Progress Notes (Signed)
Report given to [facility]. Patient being tx to floor. Patient alert with intermit confusion but has improved throughout the day. Patient is on room air, no complaint of shortness of breath. Tolerating diet, foley intact with adequate urine output. Lactic acid ordered for repeat.

## 2018-11-07 NOTE — H&P (Signed)
[employer] - [place] at [facility]   PATIENT NAME: Lacey Meyer    MR#:  [id number]  DATE OF BIRTH:  1960-04-07  DATE OF ADMISSION:  11/07/2018  PRIMARY CARE PHYSICIAN: Fleet Contras, MD   REQUESTING/REFERRING PHYSICIAN: Bayard Males, MD  CHIEF COMPLAINT:   Chief Complaint  Patient presents with  . Fever  . Altered Mental Status    HISTORY OF PRESENT ILLNESS:  Lacey Meyer  is a 58 y.o. female with a known history of diabetes mellitus, GERD, hypertension, depression.  She presented to the emergency room accompanied by her husband who reports altered mental status and fever.  Patient is slightly confused during conversation.  He reports noticing symptoms initially yesterday a.m. with fever of 103 at home.  Patient had been treated recently for urinary tract infection with p.o. antibiotics.  She reports continued dysuria with dark yellow urine.  She had noticed no foul urine odor.  She denies abdominal pain.  She has noted mild discomfort with a deep breath however has noted no cough or significant increase in shortness of breath.  She denies chest pain.  She denies nausea or vomiting.  She denies diarrhea.  She denies hematemesis, hematochezia, or melena.  She has a history of diabetes and reports her glucose usually runs 1 30-1 50.  On arrival to the emergency room glucose is 353 with lactic acid of 5.0.  Patient's temperature is 103.0.  She is tachypnea with tachycardia and heart rate in the 120s to 130s.  COVID-19 testing is negative.  This demonstrates positive nitrites with small amount of leukocytes and bacteria as well as greater than 50 white blood cells.  Chest x-ray demonstrates increased right greater than the left interstitial opacity possibly representing atypical infection.  We have admitted her to the hospitalist service for further management.  PAST MEDICAL HISTORY:   Past Medical History:  Diagnosis Date  . Anemia   . Arthritis   . Diabetes  mellitus without complication (HCC)   . GERD (gastroesophageal reflux disease)   . Gout   . History of blood transfusion   . HTN (hypertension)   . Impaired vision     PAST SURGICAL HISTORY:   Past Surgical History:  Procedure Laterality Date  . BTL    . EYE SURGERY     cataract surgery / right eye   . FOOT SURGERY     bilateral   . LAPAROSCOPIC GASTRIC SLEEVE RESECTION N/A 01/03/2016   Procedure: LAPAROSCOPIC GASTRIC SLEEVE RESECTION WITH UPPER ENDO;  Surgeon: Ovidio Kin, MD;  Location: [facility];  Service: General;  Laterality: N/A;  . SHOULDER ARTHROSCOPY W/ ROTATOR CUFF REPAIR     left   . TUBAL LIGATION      SOCIAL HISTORY:   Social History   Tobacco Use  . Smoking status: Former Smoker    Packs/day: 0.25    Years: 1.00    Pack years: 0.25    Types: Cigarettes    Quit date: 03/06/2010    Years since quitting: 8.6  . Smokeless tobacco: Never Used  Substance Use Topics  . Alcohol use: No    FAMILY HISTORY:   Family History  Problem Relation Age of Onset  . Hypertension Mother   . Hypertension Father     DRUG ALLERGIES:   Allergies  Allergen Reactions  . Ampicillin Anaphylaxis, Hives and Other (See Comments)    Has patient had a PCN reaction causing immediate rash, facial/tongue/throat swelling, SOB or lightheadedness with hypotension:  Yes Has patient had a PCN reaction causing severe rash involving mucus membranes or skin necrosis: Yes Has patient had a PCN reaction that required hospitalization Yes Has patient had a PCN reaction occurring within the last 10 years: No If all of the above answers are "NO", then may proceed with Cephalosporin use.   Marland Kitchen Doxycycline Other (See Comments)    unknown  . Penicillins   . Other Swelling, Rash and Other (See Comments)    Blistex    REVIEW OF SYSTEMS:   Review of Systems  Constitutional: Positive for chills, fever and malaise/fatigue.  HENT: Negative for congestion and sore throat.   Eyes: Negative for  blurred vision and double vision.  Respiratory: Positive for shortness of breath. Negative for cough, sputum production and wheezing.   Cardiovascular: Negative for chest pain and palpitations.  Gastrointestinal: Negative for abdominal pain, constipation, diarrhea, heartburn, nausea and vomiting.  Genitourinary: Positive for dysuria and frequency.  Musculoskeletal: Negative for falls and myalgias.  Skin: Negative for itching and rash.  Neurological: Positive for weakness. Negative for dizziness and headaches.  Psychiatric/Behavioral: Negative for depression.    MEDICATIONS AT HOME:   Prior to Admission medications   Medication Sig Start Date End Date Taking? Authorizing Provider  ACCU-CHEK GUIDE test strip USE TO TEST THREE TIMES DAILY (E11.65) 03/17/18  Yes [provider]  ALPRAZolam (XANAX) 0.25 MG tablet Take 0.25 mg by mouth daily as needed for anxiety. 05/28/16  Yes [provider]  B-D UF III MINI PEN NEEDLES 31G X 5 MM MISC  03/08/18  Yes [provider]  benazepril (LOTENSIN) 10 MG tablet Take 10 mg by mouth daily. 05/09/16  Yes [provider]  DULoxetine (CYMBALTA) 60 MG capsule Take 60 mg by mouth daily.  03/23/16  Yes [provider]  fluticasone (FLONASE) 50 MCG/ACT nasal spray USE 2 SPRAY(S) IN EACH NOSTRIL DAILY 09/23/17  Yes [provider]  furosemide (LASIX) 40 MG tablet Take 1 tablet (40 mg total) by mouth daily. 06/18/16  Yes Vassie Loll, MD  glimepiride (AMARYL) 4 MG tablet Take 4 mg by mouth 2 (two) times daily.  10/16/15  Yes [provider]  Lancets (ONETOUCH DELICA PLUS LANCET33G) MISC CHECK GLUCOSE 3 TIMES A DAY 01/17/18  Yes [provider]  LYRICA 200 MG capsule Take 200 mg by mouth 4 (four) times daily.  08/04/15  Yes [provider]  metFORMIN (GLUCOPHAGE) 500 MG tablet Take 500 mg by mouth 2 (two) times daily.  02/28/15  Yes [provider]  metoprolol succinate (TOPROL-XL) 50 MG  24 hr tablet Take 50 mg by mouth daily.  12/28/15  Yes [provider]  Multiple Vitamins-Minerals (BARIATRIC FUSION PO) Take 1 tablet by mouth daily.   Yes [provider]  nortriptyline (PAMELOR) 50 MG capsule TAKE 1 CAPSULE BY MOUTH DAILY AT BEDTIME 11/25/17  Yes [provider]  NOVOLOG FLEXPEN 100 UNIT/ML FlexPen Inject 2-15 Units into the skin 3 (three) times daily with meals.  04/23/16  Yes [provider]  Olopatadine HCl 0.2 % SOLN INSTILL 1 DROP IN [facility] EYE DAILY AS NEEDED 01/17/18  Yes [provider]  Oxycodone HCl 10 MG TABS Take 10 mg by mouth every 8 (eight) hours as needed. 02/29/16  Yes [provider]  simvastatin (ZOCOR) 40 MG tablet Take 20 mg by mouth daily. 05/21/16  Yes [provider]  tiZANidine (ZANAFLEX) 2 MG tablet TAKE 1 TABLET BY MOUTH AT BEDTIME AS NEEDED FOR PAINS  12/05/17  Yes [provider]  triamcinolone cream (KENALOG) 0.5 % APPLY CREAM TOPICALLY TO LEGS TWICE A DAY AS NEEDED 01/17/18  Yes [provider]  venlafaxine XR (EFFEXOR-XR) 150 MG 24 hr capsule Take 150 mg by mouth daily with breakfast.   Yes [provider]  XULTOPHY 100-3.6 UNIT-MG/ML SOPN INJECT 20 UNITS SUBCUTANEOUSLY ONCE DAILY IN THE MORNING 11/07/17  Yes [provider]  zolpidem (AMBIEN) 10 MG tablet Take 10 mg by mouth at bedtime as needed. 12/02/17  Yes [provider]  diclofenac sodium (VOLTAREN) 1 % GEL APPLY 4 GRAMS 4 TIMES A DAY AS NEEDED FOR PAINS 09/30/17   [provider]      VITAL SIGNS:  Blood pressure (!) 118/58, pulse (!) 114, temperature (!) 101.6 F (38.7 C), temperature source Oral, resp. rate 17, height 5\' 2"  (1.575 m), weight 129.3 kg, SpO2 94 %.  PHYSICAL EXAMINATION:  Physical Exam  GENERAL:  58 y.o.-year-old ill-appearing patient lying in the bed with no acute distress.  EYES: Pupils equal, round, reactive to light and accommodation. No scleral icterus.  Extraocular muscles intact.  HEENT: Head atraumatic, normocephalic. Oropharynx and nasopharynx clear.  NECK:  Supple, no jugular venous distention. No thyroid enlargement, no tenderness.  LUNGS: Normal breath sounds bilaterally, no wheezing, rales,rhonchi or crepitation. No use of accessory muscles of respiration.  CARDIOVASCULAR: Tachycardic with regular rhythm, S1, S2 normal. No murmurs, rubs, or gallops.  ABDOMEN: Morbid obesity soft, nondistended, nontender. Bowel sounds present. No organomegaly or mass.  EXTREMITIES: No pedal edema, cyanosis, or clubbing.  NEUROLOGIC: Cranial nerves II through XII are intact.  Generalized weakness. Sensation intact. Gait not checked.  PSYCHIATRIC: The patient is alert and oriented x 3 however slightly lethargic.   SKIN: No obvious rash, lesion, or ulcer.   LABORATORY PANEL:   CBC Recent Labs  Lab 11/07/18 0059  WBC 5.0  HGB 12.3  HCT 38.3  PLT 111*   ------------------------------------------------------------------------------------------------------------------  Chemistries  Recent Labs  Lab 11/07/18 0059  NA 135  K 5.0  CL 96*  CO2 23  GLUCOSE 353*  BUN 19  CREATININE 1.21*  CALCIUM 9.2  AST 40  ALT 31  ALKPHOS 104  BILITOT 0.7   ------------------------------------------------------------------------------------------------------------------  Cardiac Enzymes No results for input(s): TROPONINI in the last 168 hours. ------------------------------------------------------------------------------------------------------------------  RADIOLOGY:  Dg Chest Port 1 View  Result Date: 11/07/2018 CLINICAL DATA:  Fever EXAM: PORTABLE CHEST 1 VIEW COMPARISON:  06/17/2014 FINDINGS: Right greater than left interstitial and streaky opacity, most prominent at the lung bases. No consolidation or pleural effusion. Normal heart size. Aortic atherosclerosis. No pneumothorax. IMPRESSION: Increased right greater than left interstitial opacity,  most notable at the right base, may reflect interstitial inflammatory process/possible atypical infection Electronically Signed   By: Jasmine Pang M.D.   On: 11/07/2018 01:31      IMPRESSION AND PLAN:   1.  Sepsis with bilateral pneumonia - IV vancomycin, cefepime, and Flagyl initiated - Patient received 2 L normal saline in the emergency room and currently has normal saline infusing to a peripheral IV at 125 cc per hour. -Repeat CBC and BMP in the a.m. - We will repeat lactic acid per protocol - Blood cultures, urine cultures, and sputum culture are pending.  Will review results and adjust therapy accordingly when available. --DuoNeb for shortness of breath  2.  Urinary tract infection - IV antibiotic therapy as above -Will review urine culture results and adjust therapy accordingly when available  3.  Uncontrolled  diabetes mellitus - Hemoglobin A1c - Moderate sliding scale insulin  4.  GERD -PPI therapy continued  5.  History of hypertension -Patient was slightly hypotensive on arrival - We will continue to monitor closely and restart antihypertensives as tolerated and indicated  6.  Depression - Continue Effexor  DVT and PPI prophylaxis  Patient is being admitted to the ICU.  Will discuss transfer of care with the ICU intensivist.    All the records are reviewed and case discussed with ED provider. The plan of care was discussed in details with the patient (and family). I answered all questions. The patient agreed to proceed with the above mentioned plan. Further management will depend upon hospital course.   CODE STATUS: Full code  TOTAL TIME TAKING CARE OF THIS PATIENT: 45 minutes.    Milas Kocher Elwyn Lowden CRNP on 11/07/2018 at 4:43 AM  Pager - [phone]  After 6pm go to [email or web address] - Scientist, research (life sciences) [facility]  Office  [phone]  CC: Primary care physician; Fleet Contras, MD   Note: This dictation was prepared with  Dragon dictation along with smaller phrase technology. Any transcriptional errors that result from this process are unintentional.

## 2018-11-07 NOTE — ED Notes (Signed)
ED TO INPATIENT HANDOFF REPORT  ED Nurse Name and Phone #: Bascom Levels Name/Age/Gender Lacey Meyer 58 y.o. female Room/Bed: ED05A/ED05A  Code Status   Code Status: Full Code  Home/SNF/Other Home Patient oriented to: self Is this baseline? No   Triage Complete: Triage complete  Chief Complaint EMS-Altered Mental Status  Triage Note Patient arrives from home after husband noticed altered mental status and fever. Patient is confused upon arrival   Allergies Allergies  Allergen Reactions  . Ampicillin Anaphylaxis, Hives and Other (See Comments)    Has patient had a PCN reaction causing immediate rash, facial/tongue/throat swelling, SOB or lightheadedness with hypotension: Yes Has patient had a PCN reaction causing severe rash involving mucus membranes or skin necrosis: Yes Has patient had a PCN reaction that required hospitalization Yes Has patient had a PCN reaction occurring within the last 10 years: No If all of the above answers are "NO", then may proceed with Cephalosporin use.   Marland Kitchen Doxycycline Other (See Comments)    unknown  . Penicillins   . Other Swelling, Rash and Other (See Comments)    Blistex    Level of Care/Admitting Diagnosis ED Disposition    ED Disposition Condition [place] [facility] Area: [place] [[id number]]  Level of Care: ICU [6]  Covid Evaluation: Confirmed COVID Negative  Diagnosis: Sepsis [facility]) [[id number]]  Admitting Physician: Christel Mormon [[id number]]  Attending Physician: Christel Mormon [[id number]]  Estimated length of stay: past midnight tomorrow  Certification:: I certify this patient will need inpatient services for at least 2 midnights  PT Class (Do Not Modify): Inpatient [101]  PT Acc Code (Do Not Modify): Private [1]       B Medical/Surgery History Past Medical History:  Diagnosis Date  . Anemia   . Arthritis   . Diabetes mellitus without complication ([place])   . GERD (gastroesophageal reflux disease)    . Gout   . History of blood transfusion   . HTN (hypertension)   . Impaired vision    Past Surgical History:  Procedure Laterality Date  . BTL    . EYE SURGERY     cataract surgery / right eye   . FOOT SURGERY     bilateral   . LAPAROSCOPIC GASTRIC SLEEVE RESECTION N/A 01/03/2016   Procedure: LAPAROSCOPIC GASTRIC SLEEVE RESECTION WITH UPPER ENDO;  Surgeon: Alphonsa Overall, MD;  Location: [facility] ORS;  Service: General;  Laterality: N/A;  . SHOULDER ARTHROSCOPY W/ ROTATOR CUFF REPAIR     left   . TUBAL LIGATION       A IV Location/Drains/Wounds Patient Lines/Drains/Airways Status   Active Line/Drains/Airways    Name:   Placement date:   Placement time:   Site:   Days:   Peripheral IV 11/07/18 Right Antecubital   11/07/18    0100    Antecubital   less than 1   Peripheral IV 11/07/18 Right Hand   11/07/18    0120    Hand   less than 1   External Urinary Catheter   11/07/18    0238    -   less than 1   Incision (Closed) 01/03/16 Abdomen Other (Comment)   01/03/16    1010     1039   Incision - 6 Ports Abdomen Right;Lateral Right;Medial Left;Umbilicus Mid;Upper Left Left;Mid   01/03/16    1003     1039          Intake/Output Last 24 hours No  intake or output data in the 24 hours ending 11/07/18 0348  Labs/Imaging Results for orders placed or performed during the hospital encounter of 11/07/18 (from the past 48 hour(s))  Lactic acid, plasma     Status: Abnormal   Collection Time: 11/07/18 12:59 AM  Result Value Ref Range   Lactic Acid, Venous 5.0 (HH) 0.5 - 1.9 mmol/L    Comment: CRITICAL RESULT CALLED TO, READ BACK BY AND VERIFIED WITH PAIGE Hayne ON 11/07/18 AT 0[address] [facility] Performed at [facility], [address] [place], [place] [address]   Comprehensive metabolic panel     Status: Abnormal   Collection Time: 11/07/18 12:59 AM  Result Value Ref Range   Sodium 135 135 - 145 mmol/L   Potassium 5.0 3.5 - 5.1 mmol/L   Chloride 96 (L) 98 - 111 mmol/L   CO2 23 22 - 32  mmol/L   Glucose, Bld 353 (H) 70 - 99 mg/dL   BUN 19 6 - 20 mg/dL   Creatinine, Ser 6.041.21 (H) 0.44 - 1.00 mg/dL   Calcium 9.2 8.9 - 54.010.3 mg/dL   Total Protein 8.4 (H) 6.5 - 8.1 g/dL   Albumin 4.1 3.5 - 5.0 g/dL   AST 40 15 - 41 U/L   ALT 31 0 - 44 U/L   Alkaline Phosphatase 104 38 - 126 U/L   Total Bilirubin 0.7 0.3 - 1.2 mg/dL   GFR calc non Af Amer 49 (L) >60 mL/min   GFR calc Af Amer 56 (L) >60 mL/min   Anion gap 16 (H) 5 - 15    Comment: Performed at [facility], [address] [place], [place] [address]  CBC WITH DIFFERENTIAL     Status: Abnormal   Collection Time: 11/07/18 12:59 AM  Result Value Ref Range   WBC 5.0 4.0 - 10.5 K/uL   RBC 4.37 3.87 - 5.11 MIL/uL   Hemoglobin 12.3 12.0 - 15.0 g/dL   HCT 14.738.3 82.936.0 - 56.246.0 %   MCV 87.6 80.0 - 100.0 fL   MCH 28.1 26.0 - 34.0 pg   MCHC 32.1 30.0 - 36.0 g/dL   RDW 13.015.1 86.511.5 - 78.415.5 %   Platelets 111 (L) 150 - 400 K/uL    Comment: REPEATED TO VERIFY PLATELET COUNT CONFIRMED BY SMEAR Immature Platelet Fraction may be clinically indicated, consider ordering this additional test [id number]    nRBC 0.0 0.0 - 0.2 %   Neutrophils Relative % 76 %   Neutro Abs 3.8 1.7 - 7.7 K/uL   Lymphocytes Relative 18 %   Lymphs Abs 0.9 0.7 - 4.0 K/uL   Monocytes Relative 3 %   Monocytes Absolute 0.2 0.1 - 1.0 K/uL   Eosinophils Relative 2 %   Eosinophils Absolute 0.1 0.0 - 0.5 K/uL   Basophils Relative 1 %   Basophils Absolute 0.0 0.0 - 0.1 K/uL   Immature Granulocytes 0 %   Abs Immature Granulocytes 0.02 0.00 - 0.07 K/uL    Comment: Performed at [facility], [address] [place], [place] [address]  Urinalysis, Routine w reflex microscopic     Status: Abnormal   Collection Time: 11/07/18 12:59 AM  Result Value Ref Range   Color, Urine YELLOW (A) YELLOW   APPearance HAZY (A) CLEAR   Specific Gravity, Urine 1.020 1.005 - 1.030   pH 5.0 5.0 - 8.0   Glucose, UA >=500 (A) NEGATIVE mg/dL   Hgb urine dipstick MODERATE  (A) NEGATIVE   Bilirubin Urine NEGATIVE NEGATIVE  Ketones, ur NEGATIVE NEGATIVE mg/dL   Protein, ur 562100 (A) NEGATIVE mg/dL   Nitrite POSITIVE (A) NEGATIVE   Leukocytes,Ua SMALL (A) NEGATIVE   RBC / HPF 11-20 0 - 5 RBC/hpf   WBC, UA >50 (H) 0 - 5 WBC/hpf   Bacteria, UA RARE (A) NONE SEEN   Squamous Epithelial / LPF 0-5 0 - 5   WBC Clumps PRESENT     Comment: Performed at [facility], [address] [place], [place] [address]  SARS Coronavirus 2 [facility](Hospital order, Performed in [facility]) Nasopharyngeal Nasopharyngeal Swab     Status: None   Collection Time: 11/07/18 12:59 AM   Specimen: Nasopharyngeal Swab  Result Value Ref Range   SARS Coronavirus 2 NEGATIVE NEGATIVE    Comment: (NOTE) If result is NEGATIVE SARS-CoV-2 target nucleic acids are NOT DETECTED. The SARS-CoV-2 RNA is generally detectable in upper and lower  respiratory specimens during the acute phase of infection. The lowest  concentration of SARS-CoV-2 viral copies this assay can detect is 250  copies / mL. A negative result does not preclude SARS-CoV-2 infection  and should not be used as the sole basis for treatment or other  patient management decisions.  A negative result may occur with  improper specimen collection / handling, submission of specimen other  than nasopharyngeal swab, presence of viral mutation(s) within the  areas targeted by this assay, and inadequate number of viral copies  (<250 copies / mL). A negative result must be combined with clinical  observations, patient history, and epidemiological information. If result is POSITIVE SARS-CoV-2 target nucleic acids are DETECTED. The SARS-CoV-2 RNA is generally detectable in upper and lower  respiratory specimens dur ing the acute phase of infection.  Positive  results are indicative of active infection with SARS-CoV-2.  Clinical  correlation with patient history and other diagnostic information is  necessary to determine  patient infection status.  Positive results do  not rule out bacterial infection or co-infection with other viruses. If result is PRESUMPTIVE POSTIVE SARS-CoV-2 nucleic acids MAY BE PRESENT.   A presumptive positive result was obtained on the submitted specimen  and confirmed on repeat testing.  While 2019 novel coronavirus  (SARS-CoV-2) nucleic acids may be present in the submitted sample  additional confirmatory testing may be necessary for epidemiological  and / or clinical management purposes  to differentiate between  SARS-CoV-2 and other Sarbecovirus currently known to infect humans.  If clinically indicated additional testing with an alternate test  methodology [phone] is advised. The SARS-CoV-2 RNA is generally  detectable in upper and lower respiratory sp ecimens during the acute  phase of infection. The expected result is Negative. Fact Sheet for Patients:  BoilerBrush.com.cyhttps://www.fda.gov/media/136312/download Fact Sheet for Healthcare Providers: https://pope.com/https://www.fda.gov/media/136313/download This test is not yet approved or cleared by the [place] FDA and has been authorized for detection and/or diagnosis of SARS-CoV-2 by FDA under an Emergency Use Authorization (EUA).  This EUA will remain in effect (meaning this test can be used) for the duration of the COVID-19 declaration under Section 564(b)(1) of the Act, 21 U.S.C. section 360bbb-3(b)(1), unless the authorization is terminated or revoked sooner. Performed at [facility], [address] [place], [place] [address]   Protime-INR     Status: None   Collection Time: 11/07/18 12:59 AM  Result Value Ref Range   Prothrombin Time 14.6 11.4 - 15.2 seconds   INR 1.2 0.8 - 1.2    Comment: (NOTE) INR goal varies based on  device and disease states. Performed at [facility], [address] [place], [place] [address]   APTT     Status: None   Collection Time: 11/07/18 12:59 AM  Result Value Ref Range   aPTT 25  24 - 36 seconds    Comment: Performed at [facility], [address] [place], [place] [address]  Lactic acid, plasma     Status: Abnormal   Collection Time: 11/07/18  3:10 AM  Result Value Ref Range   Lactic Acid, Venous 3.5 (HH) 0.5 - 1.9 mmol/L    Comment: CRITICAL VALUE NOTED. VALUE IS CONSISTENT WITH PREVIOUSLY REPORTED/CALLED VALUE [facility] Performed at [facility], [address] [place], [place] [address]    Dg Chest Gilt Edge 1 View  Result Date: 11/07/2018 CLINICAL DATA:  Fever EXAM: PORTABLE CHEST 1 VIEW COMPARISON:  06/17/2014 FINDINGS: Right greater than left interstitial and streaky opacity, most prominent at the lung bases. No consolidation or pleural effusion. Normal heart size. Aortic atherosclerosis. No pneumothorax. IMPRESSION: Increased right greater than left interstitial opacity, most notable at the right base, may reflect interstitial inflammatory process/possible atypical infection Electronically Signed   By: Jasmine Pang M.D.   On: 11/07/2018 01:31    Pending Labs Unresulted Labs (From admission, onward)    Start     Ordered   11/14/18 0500  Creatinine, serum  (enoxaparin (LOVENOX)    CrCl >/= 30 ml/min)  Weekly,   STAT    Comments: while on enoxaparin therapy    11/07/18 0343   11/07/18 0500  Protime-INR  Tomorrow morning,   STAT     11/07/18 0343   11/07/18 0500  Cortisol-am, blood  Tomorrow morning,   STAT     11/07/18 0343   11/07/18 0500  Procalcitonin  Tomorrow morning,   STAT     11/07/18 0343   11/07/18 0500  Basic metabolic panel  Tomorrow morning,   STAT     11/07/18 0343   11/07/18 0500  CBC  Tomorrow morning,   STAT     11/07/18 0343   11/07/18 0344  HIV antibody (Routine Testing)  Once,   STAT     11/07/18 0343   11/07/18 0344  CBC  (enoxaparin (LOVENOX)    CrCl >/= 30 ml/min)  Once,   STAT    Comments: Baseline for enoxaparin therapy IF NOT ALREADY DRAWN.  Notify MD if PLT < 100 K.    11/07/18 0343   11/07/18 0344  Creatinine,  serum  (enoxaparin (LOVENOX)    CrCl >/= 30 ml/min)  Once,   STAT    Comments: Baseline for enoxaparin therapy IF NOT ALREADY DRAWN.    11/07/18 0343   11/07/18 0344  Culture, sputum-assessment  Once,   STAT    Question:  Patient immune status  Answer:  Normal   11/07/18 0343   11/07/18 0344  Hemoglobin A1c  Once,   STAT    Comments: To assess prior glycemic control    11/07/18 0343   11/07/18 0055  Blood Culture (routine x 2)  BLOOD CULTURE X 2,   STAT     11/07/18 0055   11/07/18 0055  Urine culture  ONCE - STAT,   STAT     11/07/18 0055          Vitals/Pain Today's Vitals   11/07/18 0230 11/07/18 0303 11/07/18 0309 11/07/18 0330  BP: (!) 124/47 (!) 102/54  (!) 121/58  Pulse: (!) 122 (!) 120  (!) 115  Resp: (!) 28 (!) 22  (!) 26  Temp:   (!) 101.6 F (38.7 C)   TempSrc:   Oral   SpO2: 99% 94%  98%  Weight:      Height:        Isolation Precautions No active isolations  Medications Medications  vancomycin (VANCOCIN) 2,500 mg in sodium chloride 0.9 % 500 mL IVPB (2,500 mg Intravenous New Bag/Given 11/07/18 0225)  sodium chloride 0.9 % bolus 1,000 mL (0 mLs Intravenous Stopped 11/07/18 0235)    Followed by  sodium chloride 0.9 % bolus 1,000 mL (1,000 mLs Intravenous New Bag/Given 11/07/18 0236)    Followed by  sodium chloride 0.9 % bolus 1,000 mL (has no administration in time range)    Followed by  0.9 %  sodium chloride infusion (has no administration in time range)  allopurinol (ZYLOPRIM) tablet 300 mg (has no administration in time range)  pantoprazole (PROTONIX) EC tablet 40 mg (has no administration in time range)  benazepril (LOTENSIN) tablet 10 mg (has no administration in time range)  DULoxetine (CYMBALTA) DR capsule 60 mg (has no administration in time range)  fluticasone (FLONASE) 50 MCG/ACT nasal spray 1 spray (has no administration in time range)  glimepiride (AMARYL) tablet 4 mg (has no administration in time range)  pregabalin (LYRICA) capsule 200 mg  (has no administration in time range)  meloxicam (MOBIC) tablet 7.5 mg (has no administration in time range)  metFORMIN (GLUCOPHAGE) tablet 500 mg (has no administration in time range)  metoprolol succinate (TOPROL-XL) 24 hr tablet 50 mg (has no administration in time range)  simvastatin (ZOCOR) tablet 20 mg (has no administration in time range)  nortriptyline (PAMELOR) capsule 25 mg (has no administration in time range)  enoxaparin (LOVENOX) injection 40 mg (has no administration in time range)  sodium chloride flush (NS) 0.9 % injection 3 mL (has no administration in time range)  0.9 %  sodium chloride infusion (has no administration in time range)  ceFEPIme (MAXIPIME) 2 g in sodium chloride 0.9 % 100 mL IVPB (has no administration in time range)  metroNIDAZOLE (FLAGYL) IVPB 500 mg (has no administration in time range)  vancomycin (VANCOCIN) IVPB 1000 mg/200 mL premix (has no administration in time range)  acetaminophen (TYLENOL) tablet 650 mg (has no administration in time range)    Or  acetaminophen (TYLENOL) suppository 650 mg (has no administration in time range)  ondansetron (ZOFRAN) tablet 4 mg (has no administration in time range)    Or  ondansetron (ZOFRAN) injection 4 mg (has no administration in time range)  traZODone (DESYREL) tablet 50 mg (has no administration in time range)  ipratropium-albuterol (DUONEB) 0.5-2.5 (3) MG/3ML nebulizer solution 3 mL (has no administration in time range)  insulin aspart (novoLOG) injection 0-15 Units (has no administration in time range)  insulin aspart (novoLOG) injection 0-5 Units (has no administration in time range)  metroNIDAZOLE (FLAGYL) IVPB 500 mg (0 mg Intravenous Stopped 11/07/18 0245)  ceFEPIme (MAXIPIME) 2 g in sodium chloride 0.9 % 100 mL IVPB (0 g Intravenous Stopped 11/07/18 0221)  acetaminophen (TYLENOL) tablet 1,000 mg (1,000 mg Oral Given 11/07/18 0214)    Mobility non-ambulatory Moderate fall risk   Focused  Assessments Pulmonary Assessment Handoff:  Lung sounds:   O2 Device: Room Air        R Recommendations: See Admitting Provider Note  Report given to:   Additional Notes:

## 2018-11-07 NOTE — Progress Notes (Signed)
Pharmacy Antibiotic Note  Lacey Meyer is a 58 y.o. female admitted on 11/07/2018 with pneumonia.  Pharmacy has been consulted for vanc/cefepime dosing.  Plan: Patient received vanc 1.75g IV load and cefepime 2g + flagyl 500 IV x 1 in ED  Vancomycin 1750 mg IV Q 36 hrs. Goal AUC 400-550. Expected AUC: 489.5 SCr used: 1.21 Cssmin: 10.5  Will continue cefepime 2g IV q8h and flagyl 500 mg IV q8h and will continue to monitor.  Height: 5\' 2"  (157.5 cm) Weight: 285 lb (129.3 kg) IBW/kg (Calculated) : 50.1  Temp (24hrs), Avg:102.7 F (39.3 C), Min:101.6 F (38.7 C), Max:103.6 F (39.8 C)  Recent Labs  Lab 11/07/18 0059 11/07/18 0310  WBC 5.0  --   CREATININE 1.21*  --   LATICACIDVEN 5.0* 3.5*    Estimated Creatinine Clearance: 63.8 mL/min (A) (by C-G formula based on SCr of 1.21 mg/dL (H)).    Allergies  Allergen Reactions  . Ampicillin Anaphylaxis, Hives and Other (See Comments)    Has patient had a PCN reaction causing immediate rash, facial/tongue/throat swelling, SOB or lightheadedness with hypotension: Yes Has patient had a PCN reaction causing severe rash involving mucus membranes or skin necrosis: Yes Has patient had a PCN reaction that required hospitalization Yes Has patient had a PCN reaction occurring within the last 10 years: No If all of the above answers are "NO", then may proceed with Cephalosporin use.   Marland Kitchen Doxycycline Other (See Comments)    unknown  . Penicillins   . Other Swelling, Rash and Other (See Comments)    Blistex    Thank you for allowing pharmacy to be a part of this patient's care.  Tobie Lords, PharmD, BCPS Clinical Pharmacist 11/07/2018 4:20 AM

## 2018-11-07 NOTE — ED Provider Notes (Signed)
[facility] Emergency Department Provider Note ________   First MD Initiated Contact with Patient 11/07/18 [phone]     (approximate)  I have reviewed the triage vital signs and the nursing notes.   HISTORY  Chief Complaint Fever and Altered Mental Status    HPI Lacey Meyer is a 58 y.o. female with below listed previous medical conditions presents to the emergency department via EMS secondary to fever and altered mental status.  Patient's husband states that patient has been feeling unwell during the course of today and had a noted fever earlier.  He states when they went to bed that his wife was tremulous and nonsensical in regards to her response.  Patient also admits to cough.  Patient denies any pain.        Past Medical History:  Diagnosis Date  . Anemia   . Arthritis   . Diabetes mellitus without complication (HCC)   . GERD (gastroesophageal reflux disease)   . Gout   . History of blood transfusion   . HTN (hypertension)   . Impaired vision     Patient Active Problem List   Diagnosis Date Noted  . Pain due to onychomycosis of toenails of both feet 08/27/2018  . Deviated septum 09/23/2017  . ETD (Eustachian tube dysfunction), bilateral 09/23/2017  . Nasal turbinate hypertrophy 09/23/2017  . OM (otitis media), recurrent, bilateral 09/23/2017  . Rhinitis, chronic 09/23/2017  . Community acquired pneumonia of left lower lobe of lung (HCC)   . Lactic acidosis   . Left lower lobe pneumonia (HCC) 06/16/2016  . Sepsis (HCC) 06/16/2016  . Acute encephalopathy 03/19/2016  . Diabetes mellitus type 2 in obese (HCC) 03/19/2016  . Altered mental status 03/18/2016  . Morbid obesity (HCC) 01/03/2016  . 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 Pain in limb 12/23/2012  . Arthritis of foot 12/23/2012  . Obesity, Class II, BMI 35-39.9, with comorbidity 06/25/2011  . HTN (hypertension)   . Impaired vision   .  Gout   . Anemia   . Nuclear sclerotic cataract 02/20/2011  . Pseudoaphakia 02/20/2011  . 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 Acute loss of vision 12/15/2010    Past Surgical History:  Procedure Laterality Date  . BTL    . EYE SURGERY     cataract surgery / right eye   . FOOT SURGERY     bilateral   . LAPAROSCOPIC GASTRIC SLEEVE RESECTION N/A 01/03/2016   Procedure: LAPAROSCOPIC GASTRIC SLEEVE RESECTION WITH UPPER ENDO;  Surgeon: Ovidio Kin, MD;  Location: [facility] ORS;  Service: General;  Laterality: N/A;  . SHOULDER ARTHROSCOPY W/ ROTATOR CUFF REPAIR     left   . TUBAL LIGATION      Prior to Admission medications   Medication Sig Start Date End Date Taking? Authorizing Provider  ACCU-CHEK GUIDE test strip USE TO TEST THREE TIMES DAILY (E11.65) 03/17/18  Yes [provider]  ALPRAZolam (XANAX) 0.25 MG tablet Take 0.25 mg by mouth daily as needed for anxiety. 05/28/16  Yes [provider]  B-D UF III MINI PEN NEEDLES 31G X 5 MM MISC  03/08/18  Yes [provider]  benazepril (LOTENSIN) 10 MG tablet Take 10 mg by mouth daily. 05/09/16  Yes [provider]  DULoxetine (CYMBALTA) 60 MG capsule Take 60 mg by mouth daily.  03/23/16  Yes [provider]  fluticasone (FLONASE) 50 MCG/ACT nasal spray USE 2 SPRAY(S) IN EACH NOSTRIL DAILY  09/23/17  Yes [provider]  furosemide (LASIX) 40 MG tablet Take 1 tablet (40 mg total) by mouth daily. 06/18/16  Yes Vassie LollMadera, Carlos, MD  glimepiride (AMARYL) 4 MG tablet Take 4 mg by mouth 2 (two) times daily.  10/16/15  Yes [provider]  Lancets (ONETOUCH DELICA PLUS LANCET33G) MISC CHECK GLUCOSE 3 TIMES A DAY 01/17/18  Yes [provider]  LYRICA 200 MG capsule Take 200 mg by mouth 4 (four) times daily.  08/04/15  Yes [provider]  metFORMIN (GLUCOPHAGE) 500 MG tablet Take 500 mg by mouth 2 (two) times daily.  02/28/15  Yes [provider]  metoprolol succinate (TOPROL-XL)  50 MG 24 hr tablet Take 50 mg by mouth daily.  12/28/15  Yes [provider]  Multiple Vitamins-Minerals (BARIATRIC FUSION PO) Take 1 tablet by mouth daily.   Yes [provider]  nortriptyline (PAMELOR) 50 MG capsule TAKE 1 CAPSULE BY MOUTH DAILY AT BEDTIME 11/25/17  Yes [provider]  NOVOLOG FLEXPEN 100 UNIT/ML FlexPen Inject 2-15 Units into the skin 3 (three) times daily with meals.  04/23/16  Yes [provider]  Olopatadine HCl 0.2 % SOLN INSTILL 1 [facility] NEEDED 01/17/18  Yes [provider]  Oxycodone HCl 10 MG TABS Take 10 mg by mouth every 8 (eight) hours as needed. 02/29/16  Yes [provider]  simvastatin (ZOCOR) 40 MG tablet Take 20 mg by mouth daily. 05/21/16  Yes [provider]  tiZANidine (ZANAFLEX) 2 MG tablet TAKE 1 TABLET BY MOUTH AT BEDTIME AS NEEDED FOR PAINS 12/05/17  Yes [provider]  triamcinolone cream (KENALOG) 0.5 % APPLY CREAM TOPICALLY TO LEGS TWICE A DAY AS NEEDED 01/17/18  Yes [provider]  venlafaxine XR (EFFEXOR-XR) 150 MG 24 hr capsule Take 150 mg by mouth daily with breakfast.   Yes [provider]  XULTOPHY 100-3.6 UNIT-MG/ML SOPN INJECT 20 UNITS SUBCUTANEOUSLY ONCE DAILY IN THE MORNING 11/07/17  Yes [provider]  zolpidem (AMBIEN) 10 MG tablet Take 10 mg by mouth at bedtime as needed. 12/02/17  Yes [provider]  diclofenac sodium (VOLTAREN) 1 % GEL APPLY 4 GRAMS 4 TIMES A DAY AS NEEDED FOR PAINS 09/30/17   [provider]    Allergies Ampicillin, Doxycycline, Penicillins, and Other  Family History  Problem Relation Age of Onset  . Hypertension Mother   . Hypertension Father     Social History Social History   Tobacco Use  . Smoking status: Former Smoker    Packs/day: 0.25    Years: 1.00    Pack years: 0.25    Types: Cigarettes    Quit date: 03/06/2010    Years since quitting: 8.6  . Smokeless tobacco:  Never Used  Substance Use Topics  . Alcohol use: No  . Drug use: No    Review of Systems Constitutional: Positive for fever/chills Eyes: No visual changes. ENT: No sore throat. Cardiovascular: Denies chest pain. Respiratory: Denies shortness of breath. Gastrointestinal: No abdominal pain.  No nausea, no vomiting.  No diarrhea.  No constipation. Genitourinary: Negative for dysuria. Musculoskeletal: Negative for neck pain.  Negative for back pain. Integumentary: Negative for rash. Neurological: Negative for headaches, focal weakness or numbness.  Positive for altered mental status.   ____________________________________________   PHYSICAL EXAM:  VITAL SIGNS: ED Triage Vitals  Enc Vitals Group     BP 11/07/18 0051 (!) 162/93     Pulse Rate 11/07/18 0051 Marland Kitchen(!)  132     Resp 11/07/18 0051 (!) 29     Temp 11/07/18 0051 (!) 103 F (39.4 C)     Temp Source 11/07/18 0051 Oral     SpO2 11/07/18 0049 (!) 88 %     Weight 11/07/18 0052 129.3 kg (285 lb)     Height 11/07/18 0052 1.575 m (5\' 2" )     Head Circumference --      Peak Flow --      Pain Score --      Pain Loc --      Pain Edu? --      Excl. in GC? --     Constitutional: Alert and oriented.  Ill-appearing Eyes: Conjunctivae are normal.  Mouth/Throat: Mucous membranes are dry Neck: No stridor.  No meningeal signs.   Cardiovascular: Normal rate, regular rhythm. Good peripheral circulation. Grossly normal heart sounds. Respiratory: Normal respiratory effort.  No retractions.  Bilateral rhonchi Gastrointestinal: Soft and nontender. No distention.  Musculoskeletal: No lower extremity tenderness nor edema. No gross deformities of extremities. Neurologic:  Normal speech and language. No gross focal neurologic deficits are appreciated.  Skin:  Skin is warm, dry and intact. Psychiatric: Mood and affect are normal. Speech and behavior are normal.  ____________________________________________   LABS (all labs ordered are  listed, but only abnormal results are displayed)  Labs Reviewed  LACTIC ACID, PLASMA - Abnormal; Notable for the following components:      Result Value   Lactic Acid, Venous 5.0 (*)    All other components within normal limits  LACTIC ACID, PLASMA - Abnormal; Notable for the following components:   Lactic Acid, Venous 3.5 (*)    All other components within normal limits  COMPREHENSIVE METABOLIC PANEL - Abnormal; Notable for the following components:   Chloride 96 (*)    Glucose, Bld 353 (*)    Creatinine, Ser 1.21 (*)    Total Protein 8.4 (*)    GFR calc non Af Amer 49 (*)    GFR calc Af Amer 56 (*)    Anion gap 16 (*)    All other components within normal limits  CBC WITH DIFFERENTIAL/PLATELET - Abnormal; Notable for the following components:   Platelets 111 (*)    All other components within normal limits  URINALYSIS, ROUTINE W REFLEX MICROSCOPIC - Abnormal; Notable for the following components:   Color, Urine YELLOW (*)    APPearance HAZY (*)    Glucose, UA >=500 (*)    Hgb urine dipstick MODERATE (*)    Protein, ur 100 (*)    Nitrite POSITIVE (*)    Leukocytes,Ua SMALL (*)    WBC, UA >50 (*)    Bacteria, UA RARE (*)    All other components within normal limits  SARS CORONAVIRUS 2 (HOSPITAL ORDER, PERFORMED IN [facility])  CULTURE, BLOOD (ROUTINE X 2)  CULTURE, BLOOD (ROUTINE X 2)  URINE CULTURE  EXPECTORATED SPUTUM ASSESSMENT W REFEX TO RESP CULTURE  MRSA PCR SCREENING  PROTIME-INR  APTT  HIV ANTIBODY (ROUTINE TESTING W REFLEX)  PROTIME-INR  CORTISOL-AM, BLOOD  PROCALCITONIN  CBC  CREATININE, SERUM  BASIC METABOLIC PANEL  CBC  HEMOGLOBIN A1C   ____________________________________________  EKG  ED ECG REPORT I, [place] N BROWN, the attending physician, personally viewed and interpreted this ECG.   Date: 11/07/2018  EKG Time: 12:51 AM  Rate: 133  Rhythm: Sinus tachycardia  Axis: Normal  Intervals: Normal  ST&T Change: None   ____________________________________________  RADIOLOGY I,  [place] N BROWN, personally viewed and evaluated these images (plain radiographs) as part of my medical decision making, as well as reviewing the written report by the radiologist.  ED MD interpretation: Bilateral opacities right greater than left lung chest x-ray interpretation per radiologist.  Official radiology report(s): Dg Chest Port 1 View  Result Date: 11/07/2018 CLINICAL DATA:  Fever EXAM: PORTABLE CHEST 1 VIEW COMPARISON:  06/17/2014 FINDINGS: Right greater than left interstitial and streaky opacity, most prominent at the lung bases. No consolidation or pleural effusion. Normal heart size. Aortic atherosclerosis. No pneumothorax. IMPRESSION: Increased right greater than left interstitial opacity, most notable at the right base, may reflect interstitial inflammatory process/possible atypical infection Electronically Signed   By: Jasmine PangKim  Fujinaga M.D.   On: 11/07/2018 01:31    ____________________________________________   PROCEDURES     .Critical Care Performed by: Darci CurrentBrown, Hammond N, MD Authorized by: Darci CurrentBrown,  N, MD   Critical care provider statement:    Critical care time (minutes):  30   Critical care time was exclusive of:  Separately billable procedures and treating other patients   Critical care was necessary to treat or prevent imminent or life-threatening deterioration of the following conditions:  Sepsis   Critical care was time spent personally by me on the following activities:  Development of treatment plan with patient or surrogate, discussions with consultants, evaluation of patient's response to treatment, examination of patient, obtaining history from patient or surrogate, ordering and performing treatments and interventions, ordering and review of laboratory studies, ordering and review of radiographic studies, pulse oximetry, re-evaluation of patient's condition and review of old charts      ____________________________________________   INITIAL IMPRESSION / MDM / ASSESSMENT AND PLAN / ED COURSE  As part of my medical decision making, I reviewed the following data within the electronic MEDICAL RECORD NUMBER   58 year old female presented with above-stated history and physical exam meeting sepsis criteria on arrival given fever tachycardia Hypoxia.  Laboratory data notable for a lactic acid of 5 urinalysis consistent with urinary tract infection creatinine of 1.2 with a glucose of 353.  Sepsis protocol was initiated on arrival to the emergency department patient received appropriate IV antibiotic therapy as well as normal saline IV fluid bolus.  Current heart rate 114 which is improved blood pressure 113/62 patient's oxygen saturation currently 94%.  Patient given broad-spectrum antibiotic therapy.  Patient discussed with Dr.Mansy for hospital admission further evaluation and management of sepsis likely secondary to pneumonia and a urinary tract infection.      ____________________________________________  FINAL CLINICAL IMPRESSION(S) / ED DIAGNOSES  Final diagnoses:  Sepsis, due to unspecified organism, unspecified whether acute organ dysfunction present (HCC)  Acute cystitis without hematuria  Community acquired pneumonia, unspecified laterality     MEDICATIONS GIVEN DURING THIS VISIT:  Medications  sodium chloride 0.9 % bolus 1,000 mL (0 mLs Intravenous Stopped 11/07/18 0235)    Followed by  sodium chloride 0.9 % bolus 1,000 mL (0 mLs Intravenous Stopped 11/07/18 0420)    Followed by  sodium chloride 0.9 % bolus 1,000 mL (1,000 mLs Intravenous New Bag/Given 11/07/18 0425)    Followed by  0.9 %  sodium chloride infusion (has no administration in time range)  allopurinol (ZYLOPRIM) tablet 300 mg (has no administration in time range)  pantoprazole (PROTONIX) EC tablet 40 mg (has no administration in time range)  DULoxetine (CYMBALTA) DR capsule 60 mg (has no administration in  time range)  fluticasone (FLONASE) 50 MCG/ACT nasal spray 1 spray (has  no administration in time range)  pregabalin (LYRICA) capsule 200 mg (has no administration in time range)  meloxicam (MOBIC) tablet 7.5 mg (has no administration in time range)  metoprolol succinate (TOPROL-XL) 24 hr tablet 50 mg (has no administration in time range)  simvastatin (ZOCOR) tablet 20 mg (has no administration in time range)  nortriptyline (PAMELOR) capsule 25 mg (has no administration in time range)  enoxaparin (LOVENOX) injection 40 mg (has no administration in time range)  sodium chloride flush (NS) 0.9 % injection 3 mL (has no administration in time range)  0.9 %  sodium chloride infusion (has no administration in time range)  ceFEPIme (MAXIPIME) 2 g in sodium chloride 0.9 % 100 mL IVPB (has no administration in time range)  metroNIDAZOLE (FLAGYL) IVPB 500 mg (has no administration in time range)  acetaminophen (TYLENOL) tablet 650 mg (has no administration in time range)    Or  acetaminophen (TYLENOL) suppository 650 mg (has no administration in time range)  ondansetron (ZOFRAN) tablet 4 mg (has no administration in time range)    Or  ondansetron (ZOFRAN) injection 4 mg (has no administration in time range)  traZODone (DESYREL) tablet 50 mg (has no administration in time range)  ipratropium-albuterol (DUONEB) 0.5-2.5 (3) MG/3ML nebulizer solution 3 mL (has no administration in time range)  insulin aspart (novoLOG) injection 0-15 Units (has no administration in time range)  insulin aspart (novoLOG) injection 0-5 Units (has no administration in time range)  vancomycin (VANCOCIN) 1,750 mg in sodium chloride 0.9 % 500 mL IVPB (has no administration in time range)  Chlorhexidine Gluconate Cloth 2 % PADS 6 each (has no administration in time range)  metroNIDAZOLE (FLAGYL) IVPB 500 mg (0 mg Intravenous Stopped 11/07/18 0245)  vancomycin (VANCOCIN) 2,500 mg in sodium chloride 0.9 % 500 mL IVPB (0 mg Intravenous  Stopped 11/07/18 0452)  ceFEPIme (MAXIPIME) 2 g in sodium chloride 0.9 % 100 mL IVPB (0 g Intravenous Stopped 11/07/18 0221)  acetaminophen (TYLENOL) tablet 1,000 mg (1,000 mg Oral Given 11/07/18 0214)     ED Discharge Orders    None      *Please note:  Lacey Meyer was evaluated in Emergency Department on 11/07/2018 for the symptoms described in the history of present illness. She was evaluated in the context of the global COVID-19 pandemic, which necessitated consideration that the patient might be at risk for infection with the SARS-CoV-2 virus that causes COVID-19. Institutional protocols and algorithms that pertain to the evaluation of patients at risk for COVID-19 are in a state of rapid change based on information released by regulatory bodies including the CDC and federal and state organizations. These policies and algorithms were followed during the patient's care in the ED.  Some ED evaluations and interventions may be delayed as a result of limited staffing during the pandemic.*  Note:  This document was prepared using Dragon voice recognition software and may include unintentional dictation errors.   Gregor Hams, MD 11/07/18 [phone]

## 2018-11-07 NOTE — Progress Notes (Signed)
eLink Physician-Brief Progress Note Patient Name: Lacey Meyer DOB: 1960/05/31 MRN: [id number]   Date of Service  11/07/2018  HPI/Events of Note  Pt admitted with pneumonia, sepsis and urinary tract infection.  eICU Interventions  New patient evaluation completed.        Kerry Kass Ogan 11/07/2018, 5:26 AM

## 2018-11-07 NOTE — Progress Notes (Signed)
Notified Dana NP regarding lactic acid of 3.9. No additional orders at this time.

## 2018-11-07 NOTE — Progress Notes (Signed)
CODE SEPSIS - PHARMACY COMMUNICATION  **Broad Spectrum Antibiotics should be administered within 1 hour of Sepsis diagnosis**  Time Code Sepsis Called/Page Received: 0100  Antibiotics Ordered: vanc/cefepime/flagyl  Time of 1st antibiotic administration: 0144  Additional action taken by pharmacy:   If necessary, Name of Provider/Nurse Contacted:     Tobie Lords ,PharmD Clinical Pharmacist  11/07/2018  3:52 AM

## 2018-11-07 NOTE — Consult Note (Signed)
Name: Lacey Meyer MRN: [id number] DOB: [birth year]    ADMISSION DATE:  11/07/2018 CONSULTATION DATE:  11/07/2018  REFERRING MD : Dr. Sidney Ace  CHIEF COMPLAINT: Altered mental status, fever  BRIEF PATIENT DESCRIPTION:  58 year old female admitted with sepsis secondary to UTI and questionable bilateral pneumonia.  SIGNIFICANT EVENTS  8/28>> admission to ICU  STUDIES:  CXR 8/28>>Increased right greater than left interstitial opacity, most notable at the right base, may reflect interstitial inflammatory process/possible atypical infection  CULTURES: SARS-CoV-2 PCR 8/28>. Blood x2 8/28>> Urine 8/28>> Sputum 8/28>> Strep pneumo urinary antigen 8/28>> Legionella urinary antigen 8/28>>  ANTIBIOTICS: Cefepime 8/28>> Flagyl 8/28>> Vancomycin 8/28  HISTORY OF PRESENT ILLNESS:   [name] is a 58 year old female with a past medical history notable for diabetes mellitus, hypertension, GERD, depression who presents to [facility] ED on 11/07/2018 due to complaints of altered mental status and fever of 103 at home. Pt is currently altered with no family present, therefore history is obtained from ED and nursing notes.  Her husband reported that she has been feeling unwell over the course of yesterday, with cough.  She denied chest pain, abdominal pain, nausea, vomiting, or diarrhea.  She was recently treated for UTI, however her husband reports she continued to have dysuria and dark yellow urine.  Initial work-up in the ED reveals glucose 353, creatinine 1.21, anion gap 16, lactic acid 5.0.  Her COVID-19 PCR is is negative.  Urinalysis is consistent with UTI.  Chest x-ray with bibasilar opacities concerning for interstitial inflammatory process versus atypical infection.  She met SEPSIS criteria, therefore she received IV fluids and broad spectrum antibiotics.  She is being admitted to ICU for sepsis secondary to UTI and questionable bilateral pneumonia.  PCCM is consulted for further management.   PAST MEDICAL HISTORY :   has a past medical history of Anemia, Arthritis, Diabetes mellitus without complication ([place]), GERD (gastroesophageal reflux disease), Gout, History of blood transfusion, HTN (hypertension), and Impaired vision.  has a past surgical history that includes Shoulder arthroscopy w/ rotator cuff repair; BTL; Foot surgery; Eye surgery; Tubal ligation; and Laparoscopic gastric sleeve resection (N/A, 01/03/2016). Prior to Admission medications   Medication Sig Start Date End Date Taking? Authorizing Provider  ACCU-CHEK GUIDE test strip USE TO TEST THREE TIMES DAILY (E11.65) 03/17/18  Yes [provider]  ALPRAZolam (XANAX) 0.25 MG tablet Take 0.25 mg by mouth daily as needed for anxiety. 05/28/16  Yes [provider]  B-D UF III MINI PEN NEEDLES 31G X 5 MM MISC  03/08/18  Yes [provider]  benazepril (LOTENSIN) 10 MG tablet Take 10 mg by mouth daily. 05/09/16  Yes [provider]  DULoxetine (CYMBALTA) 60 MG capsule Take 60 mg by mouth daily.  03/23/16  Yes [provider]  fluticasone (FLONASE) 50 MCG/ACT nasal spray USE 2 SPRAY(S) IN EACH NOSTRIL DAILY 09/23/17  Yes [provider]  furosemide (LASIX) 40 MG tablet Take 1 tablet (40 mg total) by mouth daily. 06/18/16  Yes Barton Dubois, MD  glimepiride (AMARYL) 4 MG tablet Take 4 mg by mouth 2 (two) times daily.  10/16/15  Yes [provider]  Lancets (ONETOUCH DELICA PLUS URKYHC62B) [place] 3 TIMES A DAY 01/17/18  Yes [provider]  LYRICA 200 MG capsule Take 200 mg by mouth 4 (four) times daily.  08/04/15  Yes [provider]  metFORMIN (GLUCOPHAGE) 500 MG tablet Take 500 mg by mouth 2 (two) times daily.  02/28/15  Yes [provider]  metoprolol succinate (TOPROL-XL) 50 MG 24 hr tablet Take 50 mg by mouth daily.  12/28/15  Yes [provider]  Multiple Vitamins-Minerals (BARIATRIC FUSION PO) Take 1 tablet by mouth daily.    Yes [provider]  nortriptyline (PAMELOR) 50 MG capsule TAKE 1 CAPSULE BY MOUTH DAILY AT BEDTIME 11/25/17  Yes [provider]  NOVOLOG FLEXPEN 100 UNIT/ML FlexPen Inject 2-15 Units into the skin 3 (three) times daily with meals.  04/23/16  Yes [provider]  Olopatadine HCl 0.2 % SOLN INSTILL 1 DROP IN [facility] EYE DAILY AS NEEDED 01/17/18  Yes [provider]  Oxycodone HCl 10 MG TABS Take 10 mg by mouth every 8 (eight) hours as needed. 02/29/16  Yes [provider]  simvastatin (ZOCOR) 40 MG tablet Take 20 mg by mouth daily. 05/21/16  Yes [provider]  tiZANidine (ZANAFLEX) 2 MG tablet TAKE 1 TABLET BY MOUTH AT BEDTIME AS NEEDED FOR PAINS 12/05/17  Yes [provider]  triamcinolone cream (KENALOG) 0.5 % APPLY CREAM TOPICALLY TO LEGS TWICE A DAY AS NEEDED 01/17/18  Yes [provider]  venlafaxine XR (EFFEXOR-XR) 150 MG 24 hr capsule Take 150 mg by mouth daily with breakfast.   Yes [provider]  XULTOPHY 100-3.6 UNIT-MG/ML SOPN INJECT 20 UNITS SUBCUTANEOUSLY ONCE DAILY IN THE MORNING 11/07/17  Yes [provider]  zolpidem (AMBIEN) 10 MG tablet Take 10 mg by mouth at bedtime as needed. 12/02/17  Yes [provider]  diclofenac sodium (VOLTAREN) 1 % GEL APPLY 4 GRAMS 4 TIMES A DAY AS NEEDED FOR PAINS 09/30/17   [provider]   Allergies  Allergen Reactions  . Ampicillin Anaphylaxis, Hives and Other (See Comments)    Has patient had a PCN reaction causing immediate rash, facial/tongue/throat swelling, SOB or lightheadedness with hypotension: Yes Has patient had a PCN reaction causing severe rash involving mucus membranes or skin necrosis: Yes Has patient had a PCN reaction that required hospitalization Yes Has patient had a PCN reaction occurring within the last 10 years: No If all of the above answers are "NO", then may proceed with Cephalosporin use.   Marland Kitchen Doxycycline Other (See  Comments)    unknown  . Penicillins   . Other Swelling, Rash and Other (See Comments)    Blistex    FAMILY HISTORY:  family history includes Hypertension in her father and mother. SOCIAL HISTORY:  reports that she quit smoking about 8 years ago. Her smoking use included cigarettes. She has a 0.25 pack-year smoking history. She has never used smokeless tobacco. She reports that she does not drink alcohol or use drugs.   COVID-19 DISASTER DECLARATION:  FULL CONTACT PHYSICAL EXAMINATION WAS NOT POSSIBLE DUE TO TREATMENT OF COVID-19 AND  CONSERVATION OF PERSONAL PROTECTIVE EQUIPMENT, LIMITED EXAM FINDINGS INCLUDE-  Patient assessed or the symptoms described in the history of present illness.  In the context of the Global COVID-19 pandemic, which necessitated consideration that the patient might be at risk for infection with the SARS-CoV-2 virus that causes COVID-19, Institutional protocols and algorithms that pertain to the evaluation of patients at risk for COVID-19 are in a state of rapid change based on information released by regulatory bodies including the CDC and federal and state organizations. These policies and algorithms were followed during the patient's care while in hospital.  REVIEW OF SYSTEMS:   Unable to obtain due to altered mental status  SUBJECTIVE:  Unable to obtain due to altered mental status  VITAL SIGNS: Temp:  [101.6 F (38.7 C)-103.6 F (39.8 C)] 101.6 F (38.7 C) (08/28 0309) Pulse Rate:  [107-134] 107 (08/28 0500) Resp:  [17-30] 17 (08/28 0500) BP: (102-162)/(47-93) 103/70 (08/28 0500) SpO2:  [88 %-100 %] 95 % (08/28 0500) Weight:  [129.3 kg] 129.3 kg (08/28 0052)  PHYSICAL EXAMINATION: General: Acutely ill-appearing female, laying in bed, on room air, no acute distress Neuro: Lethargic, withdraws from pain, pupils PERRLA HEENT: Atraumatic, normocephalic, neck supple, no JVD Cardiovascular: Tachycardia, regular rhythm, S1-S2, no murmurs rubs or  gallops Lungs: Clear to auscultation bilaterally, no wheezing, even, nonlabored, normal effort Abdomen: Obese, soft, nontender, nondistended, no guarding or rebound tenderness, bowel sounds positive x4 Musculoskeletal: Normal bulk and tone, no deformities, no edema Skin: Warm and dry, no obvious rashes lesions or ulcerations  Recent Labs  Lab 11/07/18 0059  NA 135  K 5.0  CL 96*  CO2 23  BUN 19  CREATININE 1.21*  GLUCOSE 353*   Recent Labs  Lab 11/07/18 0059  HGB 12.3  HCT 38.3  WBC 5.0  PLT 111*   Dg Chest Port 1 View  Result Date: 11/07/2018 CLINICAL DATA:  Fever EXAM: PORTABLE CHEST 1 VIEW COMPARISON:  06/17/2014 FINDINGS: Right greater than left interstitial and streaky opacity, most prominent at the lung bases. No consolidation or pleural effusion. Normal heart size. Aortic atherosclerosis. No pneumothorax. IMPRESSION: Increased right greater than left interstitial opacity, most notable at the right base, may reflect interstitial inflammatory process/possible atypical infection Electronically Signed   By: Donavan Foil M.D.   On: 11/07/2018 01:31    ASSESSMENT / PLAN:   Sepsis secondary to UTI and questionable bilateral pneumonia -Monitor fever curve -Trend WBCs and procalcitonin -Follow cultures as above -Continue cefepime, Flagyl, vancomycin for now  History of hypertension -Continuous cardiac monitoring -Maintain MAP greater than 65 -IV fluids -Levophed if needed to maintain MAP goal  AKI Lactic Acidosis -Monitor I&O's / urinary output -Follow BMP -Ensure adequate renal perfusion -Avoid nephrotoxic agents as able -Replace electrolytes as indicated -IV Fluids -Trend lactic acid  Diabetes mellitus -CBGs -SSI -Follow ICU hypo-/hyperglycemia protocol  Acute metabolic encephalopathy in the setting of sepsis -Provide supportive care -Treat sepsis -Consider head CT if no improvement with resolution of sepsis -Consider ABG             Disposition: ICU Goals of care: Full code VTE prophylaxis: Lovenox subcu Updates: Patient altered, no family present at bedside during NP rounds 8/28  Darel Hong, [facility] Pager: [phone] Cell: [phone]  11/07/2018, 5:22 AM

## 2018-11-07 NOTE — Progress Notes (Signed)
PHARMACY -  BRIEF ANTIBIOTIC NOTE   Pharmacy has received consult(s) for vancomycin/cefepime from an ED provider.  The patient's profile has been reviewed for ht/wt/allergies/indication/available labs.    One time order(s) placed for vanc 2.5g IV load + Cefepime 2g IV x 1 (patient has ampicillin allx of anaphylaxis and PCN of unknown significance but  tolerated both ceftriaxone and cefotetan back in 2017 and 2018.  Further antibiotics/pharmacy consults should be ordered by admitting physician if indicated.                       Thank you,  Tobie Lords, PharmD, BCPS Clinical Pharmacist 11/07/2018  1:05 AM

## 2018-11-07 NOTE — Progress Notes (Signed)
[place] [facility] [place] NAME: Lacey Meyer    MR#:  [id number]  DATE OF BIRTH:  [birth year]  SUBJECTIVE:  CHIEF COMPLAINT:   Chief Complaint  Patient presents with  . Fever  . Altered Mental Status  Patient seen and evaluated today Weaned off BiPAP Decrease shortness of breath No fever  REVIEW OF SYSTEMS:    ROS  CONSTITUTIONAL: No documented fever. Has fatigue, weakness. No weight gain, no weight loss.  EYES: No blurry or double vision.  ENT: No tinnitus. No postnasal drip. No redness of the oropharynx.  RESPIRATORY: occasional cough, no wheeze, no hemoptysis. Decreased dyspnea.  CARDIOVASCULAR: No chest pain. No orthopnea. No palpitations. No syncope.  GASTROINTESTINAL: No nausea, no vomiting or diarrhea. No abdominal pain. No melena or hematochezia.  GENITOURINARY: No dysuria or hematuria.  ENDOCRINE: No polyuria or nocturia. No heat or cold intolerance.  HEMATOLOGY: No anemia. No bruising. No bleeding.  INTEGUMENTARY: No rashes. No lesions.  MUSCULOSKELETAL: No arthritis. No swelling. No gout.  NEUROLOGIC: No numbness, tingling, or ataxia. No seizure-type activity.  PSYCHIATRIC: No anxiety. No insomnia. No ADD.   DRUG ALLERGIES:   Allergies  Allergen Reactions  . Ampicillin Anaphylaxis, Hives and Other (See Comments)    Has patient had a PCN reaction causing immediate rash, facial/tongue/throat swelling, SOB or lightheadedness with hypotension: Yes Has patient had a PCN reaction causing severe rash involving mucus membranes or skin necrosis: Yes Has patient had a PCN reaction that required hospitalization Yes Has patient had a PCN reaction occurring within the last 10 years: No If all of the above answers are "NO", then may proceed with Cephalosporin use.   Marland Kitchen Doxycycline Other (See Comments)    unknown  . Penicillins   . Other Swelling, Rash and Other (See Comments)    Blistex    VITALS:  Blood pressure 116/63, pulse 95,  temperature 98.3 F (36.8 C), resp. rate 19, height 5\' 2"  (1.575 m), weight 130.1 kg, SpO2 99 %.  PHYSICAL EXAMINATION:   Physical Exam  GENERAL:  58 y.o.-year-old patient lying in the bed with no acute distress.  EYES: Pupils equal, round, reactive to light and accommodation. No scleral icterus. Extraocular muscles intact.  HEENT: Head atraumatic, normocephalic. Oropharynx and nasopharynx clear.  NECK:  Supple, no jugular venous distention. No thyroid enlargement, no tenderness.  LUNGS: Improved breath sounds bilaterally, scattered rhonchi in both lungs. No use of accessory muscles of respiration.  CARDIOVASCULAR: S1, S2 normal. No murmurs, rubs, or gallops.  ABDOMEN: Soft, nontender, nondistended. Bowel sounds present. No organomegaly or mass.  EXTREMITIES: No cyanosis, clubbing or edema b/l.    NEUROLOGIC: Cranial nerves II through XII are intact. No focal Motor or sensory deficits b/l.   PSYCHIATRIC: The patient is alert and oriented x 3.  SKIN: No obvious rash, lesion, or ulcer.   LABORATORY PANEL:   CBC Recent Labs  Lab 11/07/18 0555  WBC 6.8  HGB 10.4*  HCT 32.2*  PLT 108*   ------------------------------------------------------------------------------------------------------------------ Chemistries  Recent Labs  Lab 11/07/18 0059 11/07/18 0555  NA 135 140  K 5.0 5.0  CL 96* 108  CO2 23 19*  GLUCOSE 353* 285*  BUN 19 18  CREATININE 1.21* 1.13*  CALCIUM 9.2 7.8*  AST 40  --   ALT 31  --   ALKPHOS 104  --   BILITOT 0.7  --    ------------------------------------------------------------------------------------------------------------------  Cardiac Enzymes No results for input(s): TROPONINI in the last 168 hours. ------------------------------------------------------------------------------------------------------------------  RADIOLOGY:  Dg Chest Port 1 View  Result Date: 11/07/2018 CLINICAL DATA:  Fever EXAM: PORTABLE CHEST 1 VIEW COMPARISON:   06/17/2014 FINDINGS: Right greater than left interstitial and streaky opacity, most prominent at the lung bases. No consolidation or pleural effusion. Normal heart size. Aortic atherosclerosis. No pneumothorax. IMPRESSION: Increased right greater than left interstitial opacity, most notable at the right base, may reflect interstitial inflammatory process/possible atypical infection Electronically Signed   By: Jasmine PangKim  Fujinaga M.D.   On: 11/07/2018 01:31     ASSESSMENT AND PLAN:  58 year old female patient with history of type 2 diabetes mellitus, GERD, hypertension, gout, arthritis currently in the stepdown unit  -Acute respiratory distress with hypoxia Secondary to pneumonia and sepsis Weaned off BiPAP Oxygen via nasal cannula  -Sepsis secondary to bilateral pneumonia Continue broad-spectrum IV antibiotics that is vancomycin cefepime and Flagyl started IV fluids Follow-up lactic acid, cultures Intensivist follow-up  -Diabetes mellitus uncontrolled Sliding scale coverage with insulin Diabetic teaching  -GERD Proton pump inhibitor  -Acute UTI Continue IV cefepime antibiotic Follow-up cultures   All the records are reviewed and case discussed with Care Management/Social Worker. Management plans discussed with the patient, family and they are in agreement.  CODE STATUS: Full code  DVT Prophylaxis: SCDs  TOTAL TIME TAKING CARE OF THIS PATIENT: 45 minutes.   POSSIBLE D/C IN 2 to 3 DAYS, DEPENDING ON CLINICAL CONDITION.  Ihor AustinPavan Pyreddy M.D on 11/07/2018 at 9:54 AM  Between 7am to 6pm - Pager - [phone]  After 6pm go to [email or web address] - password [email or web address] [facility]  [facility] [place] [facility]  Office  [phone]  CC: Primary care physician; Fleet ContrasAvbuere, Edwin, MD  Note: This dictation was prepared with Dragon dictation along with smaller phrase technology. Any transcriptional errors that result from this process are unintentional.

## 2018-11-08 LAB — BASIC METABOLIC PANEL
Anion gap: 8 (ref 5–15)
BUN: 12 mg/dL (ref 6–20)
CO2: 22 mmol/L (ref 22–32)
Calcium: 7.9 mg/dL — ABNORMAL LOW (ref 8.9–10.3)
Chloride: 108 mmol/L (ref 98–111)
Creatinine, Ser: 0.87 mg/dL (ref 0.44–1.00)
GFR calc Af Amer: >60 mL/min (ref >60)
GFR calc non Af Amer: >60 mL/min (ref >60)
Glucose, Bld: 247 mg/dL — ABNORMAL HIGH (ref 70–99)
Potassium: 4.2 mmol/L (ref 3.5–5.1)
Sodium: 138 mmol/L (ref 135–145)

## 2018-11-08 LAB — CBC
HCT: 33.7 % — ABNORMAL LOW (ref 36.0–46.0)
Hemoglobin: 11 g/dL — ABNORMAL LOW (ref 12.0–15.0)
MCH: 28.4 pg (ref 26.0–34.0)
MCHC: 32.6 g/dL (ref 30.0–36.0)
MCV: 86.9 fL (ref 80.0–100.0)
Platelets: 105 10*3/uL — ABNORMAL LOW (ref 150–400)
RBC: 3.88 MIL/uL (ref 3.87–5.11)
RDW: 15 % (ref 11.5–15.5)
WBC: 5.6 10*3/uL (ref 4.0–10.5)
nRBC: 0 % (ref 0.0–0.2)

## 2018-11-08 LAB — GLUCOSE, CAPILLARY
Glucose-Capillary: 277 mg/dL — ABNORMAL HIGH (ref 70–99)
Glucose-Capillary: 272 mg/dL — ABNORMAL HIGH (ref 70–99)
Glucose-Capillary: 298 mg/dL — ABNORMAL HIGH (ref 70–99)
Glucose-Capillary: 281 mg/dL — ABNORMAL HIGH (ref 70–99)

## 2018-11-08 LAB — PROCALCITONIN: Procalcitonin: 19.2 ng/mL

## 2018-11-08 LAB — HIV ANTIBODY (ROUTINE TESTING W REFLEX): HIV Screen 4th Generation wRfx: NONREACTIVE

## 2018-11-08 MED ORDER — OXYCODONE-ACETAMINOPHEN 5-325 MG PO TABS
1.0000 | ORAL_TABLET | ORAL | Status: DC | PRN
  Administered 2018-11-08 – 2018-11-09 (×3): 2 via ORAL
  Filled 2018-11-08 (×3): qty 2

## 2018-11-08 NOTE — Progress Notes (Signed)
Pt up in chair. Appetite good. A/o. Room air. Foley d/c's at 1700

## 2018-11-08 NOTE — Progress Notes (Signed)
[facility] - [place] at [facility]   PATIENT NAME: Lacey Meyer    MR#:  [id number]  DATE OF BIRTH:  04/21/1960  SUBJECTIVE:  CHIEF COMPLAINT:   Chief Complaint  Patient presents with  . Fever  . Altered Mental Status  patient wants to sit in the chair, also want to use the bathroom.  Denies shortness of breath. REVIEW OF SYSTEMS:    Review of Systems  Constitutional: Negative for chills and fever.  HENT: Negative for hearing loss.   Eyes: Negative for blurred vision, double vision and photophobia.  Respiratory: Negative for cough, hemoptysis and shortness of breath.   Cardiovascular: Negative for palpitations, orthopnea and leg swelling.  Gastrointestinal: Negative for abdominal pain, diarrhea and vomiting.  Genitourinary: Negative for dysuria and urgency.  Musculoskeletal: Negative for myalgias and neck pain.  Skin: Negative for rash.  Neurological: Negative for dizziness, focal weakness, seizures, weakness and headaches.  Psychiatric/Behavioral: Negative for memory loss. The patient does not have insomnia.       DRUG ALLERGIES:   Allergies  Allergen Reactions  . Ampicillin Anaphylaxis, Hives and Other (See Comments)    Has patient had a PCN reaction causing immediate rash, facial/tongue/throat swelling, SOB or lightheadedness with hypotension: Yes Has patient had a PCN reaction causing severe rash involving mucus membranes or skin necrosis: Yes Has patient had a PCN reaction that required hospitalization Yes Has patient had a PCN reaction occurring within the last 10 years: No If all of the above answers are "NO", then may proceed with Cephalosporin use.   Marland Kitchen Doxycycline Other (See Comments)    unknown  . Penicillins   . Other Swelling, Rash and Other (See Comments)    Blistex    VITALS:  Blood pressure 138/63, pulse 97, temperature 98.5 F (36.9 C), temperature source Oral, resp. rate 19, height 5\' 2"  (1.575 m), weight 129.9 kg, SpO2 91  %.  PHYSICAL EX AMINATION:   Physical Exam  GENERAL:  58 y.o.-year-old patient lying in the bed with no acute distress.  EYES: Pupils equal, round, reactive to light  No scleral icterus. Extraocular muscles intact.  HEENT: Head atraumatic, normocephalic. Oropharynx and nasopharynx clear.  NECK:  Supple, no jugular venous distention. No thyroid enlargement, no tenderness.  LUNGS: Improved breath sounds bilaterally, scattered rhonchi in both lungs. No use of accessory muscles of respiration.  CARDIOVASCULAR: S1, S2 normal. No murmurs, rubs, or gallops.  ABDOMEN: Soft, nontender, nondistended. Bowel sounds present. No organomegaly or mass.  EXTREMITIES: No cyanosis, clubbing or edema b/l.    NEUROLOGIC: Cranial nerves II through XII are intact. No focal Motor or sensory deficits b/l.   PSYCHIATRIC: The patient is alert and oriented x 3.  SKIN: No obvious rash, lesion, or ulcer.   LABORATORY PANEL:   CBC Recent Labs  Lab 11/08/18 0521  WBC 5.6  HGB 11.0*  HCT 33.7*  PLT 105*   ------------------------------------------------------------------------------------------------------------------ Chemistries  Recent Labs  Lab 11/07/18 0059  11/08/18 0521  NA 135   < > 138  K 5.0   < > 4.2  CL 96*   < > 108  CO2 23   < > 22  GLUCOSE 353*   < > 247*  BUN 19   < > 12  CREATININE 1.21*   < > 0.87  CALCIUM 9.2   < > 7.9*  AST 40  --   --   ALT 31  --   --   ALKPHOS 104  --   --  BILITOT 0.7  --   --    < > = values in this interval not displayed.   ------------------------------------------------------------------------------------------------------------------  Cardiac Enzymes No results for input(s): TROPONINI in the last 168 hours. ------------------------------------------------------------------------------------------------------------------  RADIOLOGY:  Dg Chest Port 1 View  Result Date: 11/07/2018 CLINICAL DATA:  Fever EXAM: PORTABLE CHEST 1 VIEW COMPARISON:   06/17/2014 FINDINGS: Right greater than left interstitial and streaky opacity, most prominent at the lung bases. No consolidation or pleural effusion. Normal heart size. Aortic atherosclerosis. No pneumothorax. IMPRESSION: Increased right greater than left interstitial opacity, most notable at the right base, may reflect interstitial inflammatory process/possible atypical infection Electronically Signed   By: Jasmine Pang M.D.   On: 11/07/2018 01:31     ASSESSMENT AND PLAN:  58 year old female patient with history of type 2 diabetes mellitus, GERD, hypertension, gout, arthritis currently in the stepdown unit  -Acute respiratory distress with hypoxia Secondary to pneumonia and sepsis Weaned off BiPAP, weaned off oxygen. Leukocytosis improved.  -Sepsis secondary to bilateral pneumonia Continue broad-spectrum IV antibiotics that is vancomycin cefepime and Flagyl started, procalcitonin level is up but patient clinically is doing better, lactic acidosis is improving.  Decrease IV fluids, continue IV antibiotics, out of bed to chair today   -Diabetes mellitus' uncontrolled, hemoglobin A1c 10.5., Sliding scale coverage with insulin Diabetic teaching, add small dose Lantus.  -GERD Proton pump inhibitor  -Acute UTI, follow urine cultures, discontinue Foley catheter, out of bed to chair as tolerated for physical therapy Continue IV cefepime antibiotic Follow-up cultures Bed to chair today, discontinue Foley catheter, physical therapy evaluation for disposition planning.  All the records are reviewed and case discussed with Care Management/Social Worker. Management plans discussed with the patient, family and they are in agreement.  CODE STATUS: Full code  DVT Prophylaxis: SCDs  TOTAL TIME TAKING CARE OF THIS PATIENT: 45 minutes.  More than 50% time spent in counseling, coordination of care POSSIBLE D/C IN 2 to 3 DAYS, DEPENDING ON CLINICAL CONDITION.  Katha Hamming M.D on  11/08/2018 at 12:38 PM  Between 7am to 6pm - Pager - [phone]  After 6pm go to [email or web address] - password [email or web address] [facility]  [facility] [place] [facility]  Office  [phone]  CC: Primary care physician; Fleet Contras, MD  Note: This dictation was prepared with Dragon dictation along with smaller phrase technology. Any transcriptional errors that result from this process are unintentional.

## 2018-11-08 NOTE — Progress Notes (Signed)
Patient was noted crying when this RN went in for intentional rounding. C/o back pain of 10/10 on a pain scale. Talked to Dr. Sidney Ace and received order for Percocet PRN for pain. Orde already r implemented and will continue to monitor.

## 2018-11-08 NOTE — Evaluation (Signed)
Physical Therapy Evaluation Patient Details Name: Lacey Meyer MRN: [id number] DOB: 06/19/1960 Today's Date: 11/08/2018   History of Present Illness  Pt is a 58 year old female admitted with pneumonia, sepsis and a UTI.  PMH includes impaired vision, arthritis and anemia.  Clinical Impression  Pt is a 58 year old female who lives in a single story home with her husband.  Pt is able to walk household distances with a SPC at baseline.  Reports no falls but several self corrected LOB's recently.  She was sitting up in her recliner and eager to work with therapy upon PT arrival.  Pt A&O x4.  She was able to ambulate >100 ft total and navigate obstacles in room with SPC and unilateral UE assistance due to balance concerns and pt's visual deficits.  Pt did desat to 88% O2 during ambulation and quickly recovered to 92% with <60 sec of sitting.  Pt presented with fair strength of LE's and balance deficits with dynamic gait activity and standing activity requiring pt to reach outside BOS.  Pt will continue to benefit from skilled PT with focus on balance, tolerance to activity and safe functional mobility.  She is appropriate for home health therapy with intermittent supervision to address above deficits following discharge.    Follow Up Recommendations Home health PT;Supervision - Intermittent    Equipment Recommendations  3in1 (PT)    Recommendations for Other Services       Precautions / Restrictions Precautions Precautions: Fall      Mobility  Bed Mobility Overal bed mobility: Modified Independent             General bed mobility comments: Increased time  Transfers Overall transfer level: Needs assistance Equipment used: Straight cane Transfers: Sit to/from Stand Sit to Stand: Min assist         General transfer comment: Able to stand from bedside with heavy reliance on [facility] but no effort from PT to initiate pt to rise.  Pt required Min assistance to rise from toilet without  riser.  Ambulation/Gait Ambulation/Gait assistance: Min guard Gait Distance (Feet): 100 Feet Assistive device: Straight cane     Gait velocity interpretation: 1.31 - 2.62 ft/sec, indicative of limited community ambulator General Gait Details: Fair foot clearance and step length, use of SPC and unilateral UE for support and to navigate objects due to pt's visual deficits.  No LOB's but pt did become SOB at 50 ft and requested to return to room.  Stairs            Wheelchair Mobility    Modified Rankin (Stroke Patients Only)       Balance Overall balance assessment: Needs assistance Sitting-balance support: Feet supported Sitting balance-Leahy Scale: Good     Standing balance support: Single extremity supported Standing balance-Leahy Scale: Fair Standing balance comment: Pt able to stand to perform static balance activity without UE support long enough to wash hands.  Reaching outside BOS requires unilateral UE assistance. Single Leg Stance - Right Leg: 0 Single Leg Stance - Left Leg: 0 Tandem Stance - Right Leg: 0 Tandem Stance - Left Leg: 0 Rhomberg - Eyes Opened: 10                   Pertinent Vitals/Pain Pain Assessment: Faces Faces Pain Scale: Hurts a little bit Pain Location: Low back Pain Descriptors / Indicators: Aching Pain Intervention(s): Limited activity within patient's tolerance;Monitored during session    Home Living Family/patient expects to be discharged to::  Private residence Living Arrangements: Spouse/significant other Available Help at Discharge: Family;Available 24 hours/day Type of Home: House Home Access: Level entry     Home Layout: One level Home Equipment: Cane - single point      Prior Function Level of Independence: Independent with assistive device(s)         Comments: Pt limited to mostly household ambulation with SPC.  States that she liked to walk outside for exercise prior to neuropathy diagnosis.     Hand  Dominance        Extremity/Trunk Assessment   Upper Extremity Assessment Upper Extremity Assessment: Generalized weakness    Lower Extremity Assessment Lower Extremity Assessment: Overall WFL for tasks assessed(Grossly 4/5 bilaterally: ankle PF, knee extension/flexion.)    Cervical / Trunk Assessment Cervical / Trunk Assessment: Normal  Communication   Communication: No difficulties  Cognition Arousal/Alertness: Awake/alert Behavior During Therapy: WFL for tasks assessed/performed Overall Cognitive Status: Within Functional Limits for tasks assessed                                 General Comments: Very pleasant and ready to work with therapy. No difficulty in following directions.      General Comments      Exercises Other Exercises Other Exercises: Time to discuss benefit of Home health PT for fall prevention and help with navigating home.  Education regarding scheduling HEP, etc. x8 min.   Assessment/Plan    PT Assessment Patient needs continued PT services  PT Problem List Decreased strength;Decreased mobility;Decreased activity tolerance;Decreased balance       PT Treatment Interventions Gait training;Therapeutic exercise;Stair training;Balance training;Functional mobility training;Therapeutic activities;DME instruction;Patient/family education    PT Goals (Current goals can be found in the Care Plan section)  Acute Rehab PT Goals Patient Stated Goal: To return to being able to walk for exercise. PT Goal Formulation: With patient Time For Goal Achievement: 11/22/18 Potential to Achieve Goals: Good    Frequency Min 2X/week   Barriers to discharge        Co-evaluation               AM-PAC PT "6 Clicks" Mobility  Outcome Measure Help needed turning from your back to your side while in a flat bed without using bedrails?: A Little Help needed moving from lying on your back to sitting on the side of a flat bed without using bedrails?: A  Little Help needed moving to and from a bed to a chair (including a wheelchair)?: A Little Help needed standing up from a chair using your arms (e.g., wheelchair or bedside chair)?: A Little Help needed to walk in hospital room?: A Little Help needed climbing 3-5 steps with a railing? : A Little 6 Click Score: 18    End of Session Equipment Utilized During Treatment: Gait belt Activity Tolerance: Patient limited by fatigue(O2 desat to 88% during ambulation.  Recovered to 92% following <1 min of sitting.) Patient left: in chair;with call bell/phone within reach;with family/visitor present;with chair alarm set;with nursing/sitter in room Nurse Communication: Mobility status(O2 saturation.) PT Visit Diagnosis: Unsteadiness on feet (R26.81);Muscle weakness (generalized) (M62.81);Difficulty in walking, not elsewhere classified (R26.2)    Time: 1308-6578 PT Time Calculation (min) (ACUTE ONLY): 31 min   Charges:   PT Evaluation $PT Eval Low Complexity: 1 Low PT Treatments $Therapeutic Activity: 8-22 mins        Glenetta Hew, PT, DPT   Glenetta Hew 11/08/2018, 1:56  PM

## 2018-11-09 LAB — GLUCOSE, CAPILLARY
Glucose-Capillary: 238 mg/dL — ABNORMAL HIGH (ref 70–99)
Glucose-Capillary: 315 mg/dL — ABNORMAL HIGH (ref 70–99)
Glucose-Capillary: 231 mg/dL — ABNORMAL HIGH (ref 70–99)
Glucose-Capillary: 268 mg/dL — ABNORMAL HIGH (ref 70–99)

## 2018-11-09 LAB — LEGIONELLA PNEUMOPHILA SEROGP 1 UR AG: L. pneumophila Serogp 1 Ur Ag: NEGATIVE

## 2018-11-09 LAB — PROCALCITONIN: Procalcitonin: 9.16 ng/mL

## 2018-11-09 NOTE — Progress Notes (Signed)
[facility] [place] [facility]   PATIENT NAME: Lacey Meyer    MR#:  [id number]  DATE OF BIRTH:  October 11, 1960  SUBJECTIVE:  CHIEF COMPLAINT:   Chief Complaint  Patient presents with  . Fever  . 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  Otherwise no complaints except nausea.  Foley catheter was discontinued yesterday evening, patient is voiding well. REVIEW OF SYSTEMS:    Review of Systems  Constitutional: Negative for chills and fever.  HENT: Negative for hearing loss.   Eyes: Negative for blurred vision, double vision and photophobia.  Respiratory: Negative for cough, hemoptysis and shortness of breath.   Cardiovascular: Negative for palpitations, orthopnea and leg swelling.  Gastrointestinal: Negative for abdominal pain, diarrhea and vomiting.  Genitourinary: Negative for dysuria and urgency.  Musculoskeletal: Negative for myalgias and neck pain.  Skin: Negative for rash.  Neurological: Negative for dizziness, focal weakness, seizures, weakness and headaches.  Psychiatric/Behavioral: Negative for memory loss. The patient does not have insomnia.       DRUG ALLERGIES:   Allergies  Allergen Reactions  . Ampicillin Anaphylaxis, Hives and Other (See Comments)    Has patient had a PCN reaction causing immediate rash, facial/tongue/throat swelling, SOB or lightheadedness with hypotension: Yes Has patient had a PCN reaction causing severe rash involving mucus membranes or skin necrosis: Yes Has patient had a PCN reaction that required hospitalization Yes Has patient had a PCN reaction occurring within the last 10 years: No If all of the above answers are "NO", then may proceed with Cephalosporin use.   Marland Kitchen Doxycycline Other (See Comments)    unknown  . Penicillins   . Other Swelling, Rash and  Other (See Comments)    Blistex    VITALS:  Blood pressure (!) 159/65, pulse 88, temperature 98.3 F (36.8 C), temperature source Oral, resp. rate 20, height 5\' 2"  (1.575 m), weight 129.2 kg, SpO2 96 %.  PHYSICAL EX AMINATION:   Physical Exam  GENERAL:  58 y.o.-year-old patient lying in the bed with no acute distress.  EYES: Pupils equal, round, reactive to light  No scleral icterus. Extraocular muscles intact.  HEENT: Head atraumatic, normocephalic. Oropharynx and nasopharynx clear.  NECK:  Supple, no jugular venous distention. No thyroid enlargement, no tenderness.  LUNGS: Improved breath sounds bilaterally, scattered rhonchi in both lungs. No use of accessory muscles of respiration.  CARDIOVASCULAR: S1, S2 normal. No murmurs, rubs, or gallops.  ABDOMEN: Soft, nontender, nondistended. Bowel sounds present. No organomegaly or mass.  EXTREMITIES: No cyanosis, clubbing or edema b/l.    NEUROLOGIC: Cranial nerves II through XII are intact. No focal Motor or sensory deficits b/l.   PSYCHIATRIC: The patient is alert and oriented x 3.  SKIN: No obvious rash, lesion, or ulcer.   LABORATORY PANEL:   CBC Recent Labs  Lab 11/08/18 0521  WBC 5.6  HGB 11.0*  HCT 33.7*  PLT 105*   ------------------------------------------------------------------------------------------------------------------ Chemistries  Recent Labs  Lab 11/07/18 0059  11/08/18 0521  NA 135   < > 138  K 5.0   < > 4.2  CL 96*   < > 108  CO2 23   < > 22  GLUCOSE 353*   < > 247*  BUN 19   < > 12  CREATININE 1.21*   < > 0.87  CALCIUM 9.2   < > 7.9*  AST 40  --   --   ALT 31  --   --   ALKPHOS 104  --   --   BILITOT 0.7  --   --    < > = values in this interval not displayed.   ------------------------------------------------------------------------------------------------------------------  Cardiac Enzymes No results for input(s): TROPONINI in the last 168  hours. ------------------------------------------------------------------------------------------------------------------  RADIOLOGY:  No results found.   ASSESSMENT AND PLAN:  58 year old female patient with history of type 2 diabetes mellitus, GERD, hypertension, gout, arthritis currently in the stepdown unit  -Acute respiratory distress with hypoxia Secondary to pneumonia and sepsis Weaned off BiPAP, weaned off oxygen. Leukocytosis improved.  -Sepsis secondary to bilateral pneumonia Continue broad-spectrum IV antibiotics that is vancomycin cefepime and Flagyl started, p pro calcitonin level is down from 19.2-9.16.  -Diabetes mellitus' uncontrolled, hemoglobin A1c 10.5., Sliding scale coverage with insulin Diabetic teaching, add small dose Lantus. Diabetic gastroparesis, continue Reglan.   -GERD Proton pump inhibitor, continue nausea medicine, likely discharge tomorrow with PPIs, nausea medicines.  -Acute UTI, urine cultures are showing Klebsiella, E. coli, waiting for sensitivity results, possible discharge tomorrow. 1 out of 4 bottles positive for gram-positive rods in blood cultures, likely contaminant.  Deconditioning, physical therapy recommends home health physical therapy. All the records are reviewed and case discussed with Care Management/Social Worker. Management plans discussed with the patient, family and they are in agreement.  CODE STATUS: Full code  DVT Prophylaxis: SCDs  TOTAL TIME TAKING CARE OF THIS PATIENT: 45 minutes.  More than 50% time spent in counseling, coordination of care POSSIBLE D/C IN 2 to 3 DAYS, DEPENDING ON CLINICAL CONDITION.  Katha Hamming M.D on 11/09/2018 at 11:08 AM  Between 7am to 6pm - Pager - [phone]  After 6pm go to [email or web address] - password [email or web address]  [facility]  Office  [phone]  CC: Primary care physician; Fleet Contras, MD  Note: This dictation was prepared with Dragon dictation along  with smaller phrase technology. Any transcriptional errors that result from this process are unintentional.

## 2018-11-09 NOTE — Progress Notes (Signed)
PHARMACY - PHYSICIAN COMMUNICATION CRITICAL VALUE ALERT - BLOOD CULTURE IDENTIFICATION (BCID)  Results for orders placed or performed during the hospital encounter of 06/16/16  Blood Culture ID Panel (Reflexed) (Collected: 06/16/2016 11:15 AM)  Result Value Ref Range   Enterococcus species NOT DETECTED NOT DETECTED   Listeria monocytogenes NOT DETECTED NOT DETECTED   Staphylococcus species NOT DETECTED NOT DETECTED   Staphylococcus aureus (BCID) NOT DETECTED NOT DETECTED   Streptococcus species NOT DETECTED NOT DETECTED   Streptococcus agalactiae NOT DETECTED NOT DETECTED   Streptococcus pneumoniae NOT DETECTED NOT DETECTED   Streptococcus pyogenes NOT DETECTED NOT DETECTED   Acinetobacter baumannii NOT DETECTED NOT DETECTED   Enterobacteriaceae species NOT DETECTED NOT DETECTED   Enterobacter cloacae complex NOT DETECTED NOT DETECTED   Escherichia coli NOT DETECTED NOT DETECTED   Klebsiella oxytoca NOT DETECTED NOT DETECTED   Klebsiella pneumoniae NOT DETECTED NOT DETECTED   Proteus species NOT DETECTED NOT DETECTED   Serratia marcescens NOT DETECTED NOT DETECTED   Haemophilus influenzae NOT DETECTED NOT DETECTED   Neisseria meningitidis NOT DETECTED NOT DETECTED   Pseudomonas aeruginosa NOT DETECTED NOT DETECTED   Candida albicans NOT DETECTED NOT DETECTED   Candida glabrata NOT DETECTED NOT DETECTED   Candida krusei NOT DETECTED NOT DETECTED   Candida parapsilosis NOT DETECTED NOT DETECTED   Candida tropicalis NOT DETECTED NOT DETECTED   BCID Call: 1/4 GPR  Name of physician (or Provider) Contacted: Dr Vianne Bulls  Changes to prescribed antibiotics required: no changes, considered a contaminant  Dallie Piles, PharmD 11/09/2018  9:34 AM

## 2018-11-10 LAB — URINE CULTURE: Culture: >=100000 — AB

## 2018-11-10 LAB — GLUCOSE, CAPILLARY: Glucose-Capillary: 215 mg/dL — ABNORMAL HIGH (ref 70–99)

## 2018-11-10 MED ORDER — OMEPRAZOLE 20 MG PO CPDR: 20.0000 mg | DELAYED_RELEASE_CAPSULE | Freq: Every day | ORAL | 0 refills | Status: DC

## 2018-11-10 MED ORDER — CIPROFLOXACIN HCL 500 MG PO TABS: 500.0000 mg | ORAL_TABLET | Freq: Two times a day (BID) | ORAL | 0 refills | Status: AC

## 2018-11-10 MED ORDER — ONDANSETRON 4 MG PO TBDP: 4.0000 mg | ORAL_TABLET | Freq: Three times a day (TID) | ORAL | 0 refills | Status: AC | PRN

## 2018-11-10 NOTE — Care Management Important Message (Signed)
Important Message  Patient Details  Name: AYMEE FOMBY MRN: [id number] Date of Birth: [birth year]   Medicare Important Message Given:  Yes     Dannette Barbara 11/10/2018, 11:51 AM

## 2018-11-10 NOTE — Discharge Instructions (Signed)
Fever, Adult     A fever is an increase in your body's temperature. It often means a temperature of 100.4F (38C) or higher. Brief mild or moderate fevers often have no long-term effects. They often do not need treatment. Moderate or high fevers may make you feel uncomfortable. Sometimes, they can be a sign of a serious illness or disease. A fever that keeps coming back or that lasts a long time may cause you to lose water in your body (get dehydrated). You can take your temperature with a thermometer to see if you have a fever. Temperature can change with:  Age.  Time of day.  Where the thermometer is put in the body. Readings may vary when the thermometer is put: ? In the mouth (oral). ? In the butt (rectal). ? In the ear (tympanic). ? Under the arm (axillary). ? On the forehead (temporal). Follow these instructions at home: Medicines  Take over-the-counter and prescription medicines only as told by your doctor. Follow the dosing instructions carefully.  If you were prescribed an antibiotic medicine, take it as told by your doctor. Do not stop taking it even if you start to feel better. General instructions  Watch for any changes in your symptoms. Tell your doctor about them.  Rest as needed.  Drink enough fluid to keep your pee (urine) pale yellow.  Sponge yourself or bathe with room-temperature water as needed. This helps to lower your body temperature. Do not use ice water.  Do not use too many blankets or wear clothes that are too heavy.  If your fever was caused by an infection that spreads from person to person (is contagious), such as a cold or the flu: ? You should stay home from work and public places for at least 24 hours after your fever is gone. ? Your fever should be gone for at least 24 hours without the need to use medicines. Contact a doctor if:  You throw up (vomit).  You cannot eat or drink without throwing up.  You have watery poop (diarrhea).  It  hurts when you pee.  Your symptoms do not get better with treatment.  You have new symptoms.  You feel very weak. Get help right away if:  You are short of breath or have trouble breathing.  You are dizzy or you pass out (faint).  You feel mixed up (confused).  You have signs of not having enough water in your body, such as: ? Dark pee, very little pee, or no pee. ? Cracked lips. ? Dry mouth. ? Sunken eyes. ? Sleepiness. ? Weakness.  You have very bad pain in your belly (abdomen).  You keep throwing up or having watery poop.  You have a rash on your skin.  Your symptoms get worse all of a sudden. Summary  A fever is an increase in your body's temperature. It often means a temperature of 100.4F (38C) or higher.  Watch for any changes in your symptoms. Tell your doctor about them.  Take all medicines only as told by your doctor.  Do not go to work or other public places if your fever was caused by an illness that can spread to other people.  Get help right away if you have signs that you do not have enough water in your body. This information is not intended to replace advice given to you by your health care provider. Make sure you discuss any questions you have with your health care provider. Document Released: 12/06/2007   Document Revised: 08/12/2017 Document Reviewed: 08/12/2017 Elsevier Patient Education  2020 [employer]  

## 2018-11-11 NOTE — Discharge Summary (Addendum)
Lacey Meyer, is a 58 y.o. female  DOB 03-16-1960  MRN [id number].  Admission date:  11/07/2018  Admitting Physician  Hannah Beat, MD  Discharge Date:  11/10/2018   Primary MD  Fleet Contras, MD  Recommendations for primary care physician for things to follow:   Follow-up with PCP in 1 week   Admission Diagnosis  Acute cystitis without hematuria [N30.00] Community acquired pneumonia, unspecified laterality [J18.9] Sepsis, due to unspecified organism, unspecified whether acute organ dysfunction present [facility]) [A41.9]   Discharge Diagnosis  Acute cystitis without hematuria [N30.00] Community acquired pneumonia, unspecified laterality [J18.9] Sepsis, due to unspecified organism, unspecified whether acute organ dysfunction present (HCC) [A41.9]    Active Problems:   Sepsis (HCC)      Past Medical History:  Diagnosis Date  . Anemia   . Arthritis   . Diabetes mellitus without complication (HCC)   . GERD (gastroesophageal reflux disease)   . Gout   . History of blood transfusion   . HTN (hypertension)   . Impaired vision     Past Surgical History:  Procedure Laterality Date  . BTL    . EYE SURGERY     cataract surgery / right eye   . FOOT SURGERY     bilateral   . LAPAROSCOPIC GASTRIC SLEEVE RESECTION N/A 01/03/2016   Procedure: LAPAROSCOPIC GASTRIC SLEEVE RESECTION WITH UPPER ENDO;  Surgeon: Ovidio Kin, MD;  Location: [facility];  Service: General;  Laterality: N/A;  . SHOULDER ARTHROSCOPY W/ ROTATOR CUFF REPAIR     left   . TUBAL LIGATION         History of present illness and  Hospital Course:     Kindly see H&P for history of present illness and admission details, please review complete Labs, Consult reports and Test reports for all details in brief  HPI  from the history and physical done on the  day of admission 58 year old female patient with history of diabetes mellitus type 2, GERD, hypertension, gout, history of gastric sleeve operation, seen with fever, altered mental status, admitted to stepdown unit.   Hospital Course  Sepsis with bilateral pneumonia, UTI, present on admission and treated, patient admitted to stepdown unit, received aggressive hydration along with IV vancomycin, cefepime, Flagyl as per sepsis protocol, and had fever up to 103 Fahrenheit, symptoms slowly improved patient had acute respiratory distress secondary to pneumonia.  #2 acute respiratory distress with hypoxia secondary to aspiration pneumonia, sepsis: In initially required BiPAP then slowly improved to room air, patient received aggressive IV antibiotics, patient had leukocytosis that also improved.  Sepsis improved, WBC decreased from 19.2-9.16. 3.  E. coli, Klebsiella UTI, discharged home with Cipro 500 mg p.o. twice daily for 10 days. 4.  History of gastric bypass, patient states that w whenever she gets reflux she vomits and he likely had aspiration when she came, so advised her to take PPIs, wrote prescription for nausea medicines.  5.  Diabetes mellitus type 2: Hemoglobin A1c 10.5, continue Lantus at discharge along with metformin.  Patient likely has diabetic gastroparesis on Reglan.  6.  GERD continue PPIs 7.  Deconditioning: Physical therapy recommends home health physical therapy discharge home with home health physical therapy. Discharge Condition: Stable Follow UP  Follow-up Information    Fleet Contras, MD. Schedule an appointment as soon as possible for a visit in 1 week.   Specialty: Internal Medicine Why: Patient needs to schedule appointment  Contact information: [address] [place] [address] [phone]  Discharge Instructions  and  Discharge Medications      Allergies as of 11/10/2018      Reactions   Ampicillin Anaphylaxis, Hives, Other (See  Comments)   Has patient had a PCN reaction causing immediate rash, facial/tongue/throat swelling, SOB or lightheadedness with hypotension: Yes Has patient had a PCN reaction causing severe rash involving mucus membranes or skin necrosis: Yes Has patient had a PCN reaction that required hospitalization Yes Has patient had a PCN reaction occurring within the last 10 years: No If all of the above answers are "NO", then may proceed with Cephalosporin use.   Doxycycline Other (See Comments)   unknown   Penicillins    Other Swelling, Rash, Other (See Comments)   Blistex      Medication List    STOP taking these medications   nortriptyline 50 MG capsule Commonly known as: PAMELOR     TAKE these medications   Accu-Chek Guide test strip Generic drug: glucose blood USE TO TEST THREE TIMES DAILY (E11.65) Notes to patient: DNA   ALPRAZolam 0.25 MG tablet Commonly known as: XANAX Take 0.25 mg by mouth daily as needed for anxiety.   B-D UF III MINI PEN NEEDLES 31G X 5 MM Misc Generic drug: Insulin Pen Needle Notes to patient: DNA   BARIATRIC FUSION PO Take 1 tablet by mouth daily. Notes to patient: Today when home.    benazepril 10 MG tablet Commonly known as: LOTENSIN Take 10 mg by mouth daily. Notes to patient: Today when home.    ciprofloxacin 500 MG tablet Commonly known as: Cipro Take 1 tablet (500 mg total) by mouth 2 (two) times daily for 10 days. Notes to patient: Today when home.    diclofenac sodium 1 % Gel Commonly known as: VOLTAREN APPLY 4 GRAMS 4 TIMES A DAY AS NEEDED FOR PAINS   DULoxetine 60 MG capsule Commonly known as: CYMBALTA Take 60 mg by mouth daily. Notes to patient: Tomorrow at 9A.    fluticasone 50 MCG/ACT nasal spray Commonly known as: FLONASE USE 2 SPRAY(S) IN EACH NOSTRIL DAILY Notes to patient: Today when home.    furosemide 40 MG tablet Commonly known as: LASIX Take 1 tablet (40 mg total) by mouth daily. Notes to patient: Today when  home.    glimepiride 4 MG tablet Commonly known as: AMARYL Take 4 mg by mouth 2 (two) times daily. Notes to patient: Today when home.    Lyrica 200 MG capsule Generic drug: pregabalin Take 200 mg by mouth 4 (four) times daily. Notes to patient: Today at 2P.    metFORMIN 500 MG tablet Commonly known as: GLUCOPHAGE Take 500 mg by mouth 2 (two) times daily. Notes to patient: Today when home.    metoprolol succinate 50 MG 24 hr tablet Commonly known as: TOPROL-XL Take 50 mg by mouth daily. Notes to patient: Tomorrow at 9A.    NovoLOG FlexPen 100 UNIT/ML FlexPen Generic drug: insulin aspart Inject 2-15 Units into the skin 3 (three) times daily with meals. Notes to patient: Today w/ lunch, PRN.    Olopatadine HCl 0.2 % Soln INSTILL 1 DROP IN [facility] EYE DAILY AS NEEDED   omeprazole 20 MG capsule Commonly known as: PriLOSEC Take 1 capsule (20 mg total) by mouth daily. Notes to patient: Today when home.    ondansetron 4 MG disintegrating tablet Commonly known as: Zofran ODT Take 1 tablet (4 mg total) by mouth every 8 (eight) hours as needed for up to 7 days for nausea  or vomiting.   OneTouch Delica Plus Lancet33G Misc CHECK GLUCOSE 3 TIMES A DAY Notes to patient: DNA   Oxycodone HCl 10 MG Tabs Take 10 mg by mouth every 8 (eight) hours as needed.   simvastatin 40 MG tablet Commonly known as: ZOCOR Take 20 mg by mouth daily. Notes to patient: Today when home.    tiZANidine 2 MG tablet Commonly known as: ZANAFLEX TAKE 1 TABLET BY MOUTH AT BEDTIME AS NEEDED FOR PAINS   triamcinolone cream 0.5 % Commonly known as: KENALOG APPLY CREAM TOPICALLY TO LEGS TWICE A DAY AS NEEDED   venlafaxine XR 150 MG 24 hr capsule Commonly known as: EFFEXOR-XR Take 150 mg by mouth daily with breakfast. Notes to patient: Tomorrow w/ breakfast.    Xultophy 100-3.6 UNIT-MG/ML Sopn Generic drug: Insulin Degludec-Liraglutide INJECT 20 UNITS SUBCUTANEOUSLY ONCE DAILY IN THE MORNING Notes to  patient: Today when home.    zolpidem 10 MG tablet Commonly known as: AMBIEN Take 10 mg by mouth at bedtime as needed.         Diet and Activity recommendation: See Discharge Instructions above   Consults obtained -intensivist, physical therapy   Major procedures and Radiology Reports - PLEASE review detailed and final reports for all details, in brief -      Dg Chest Port 1 View  Result Date: 11/07/2018 CLINICAL DATA:  Fever EXAM: PORTABLE CHEST 1 VIEW COMPARISON:  06/17/2014 FINDINGS: Right greater than left interstitial and streaky opacity, most prominent at the lung bases. No consolidation or pleural effusion. Normal heart size. Aortic atherosclerosis. No pneumothorax. IMPRESSION: Increased right greater than left interstitial opacity, most notable at the right base, may reflect interstitial inflammatory process/possible atypical infection Electronically Signed   By: Jasmine Pang M.D.   On: 11/07/2018 01:31    Micro Results     Recent Results (from the past 240 hour(s))  Blood Culture (routine x 2)     Status: None (Preliminary result)   Collection Time: 11/07/18 12:59 AM   Specimen: BLOOD  Result Value Ref Range Status   Specimen Description BLOOD RIGHT ANTECUBITAL  Final   Special Requests   Final    BOTTLES DRAWN AEROBIC AND ANAEROBIC Blood Culture adequate volume   Culture  Setup Time   Final    GRAM POSITIVE RODS ANAEROBIC BOTTLE ONLY CRITICAL RESULT CALLED TO, READ BACK BY AND VERIFIED WITH: RODNEY GRUBB @0926  11/09/18 AKT Performed at [facility], [address] [place], [place] [address]    Culture GRAM POSITIVE RODS  Final   Report Status PENDING  Incomplete  Blood Culture (routine x 2)     Status: None (Preliminary result)   Collection Time: 11/07/18 12:59 AM   Specimen: BLOOD  Result Value Ref Range Status   Specimen Description BLOOD LEFT ANTECUBITAL  Final   Special Requests   Final    BOTTLES DRAWN AEROBIC AND ANAEROBIC Blood Culture  adequate volume   Culture   Final    NO GROWTH 4 DAYS Performed at [facility], [address] [place], [place] [address]    Report Status PENDING  Incomplete  Urine culture     Status: Abnormal   Collection Time: 11/07/18 12:59 AM   Specimen: Urine, Random  Result Value Ref Range Status   Specimen Description   Final    URINE, RANDOM Performed at [facility], [address], [place], [place] [address]    Special Requests   Final    NONE Performed at  [facility], [address], [place], [place] [address]    Culture (A)  Final    >=100,000 COLONIES/mL KLEBSIELLA PNEUMONIAE >=100,000 COLONIES/mL ESCHERICHIA COLI    Report Status 11/10/2018 FINAL  Final   Organism ID, Bacteria KLEBSIELLA PNEUMONIAE (A)  Final   Organism ID, Bacteria ESCHERICHIA COLI (A)  Final      Susceptibility   Escherichia coli - MIC*    AMPICILLIN <=2 SENSITIVE Sensitive     CEFAZOLIN <=4 SENSITIVE Sensitive     CEFTRIAXONE <=1 SENSITIVE Sensitive     CIPROFLOXACIN <=0.25 SENSITIVE Sensitive     GENTAMICIN <=1 SENSITIVE Sensitive     IMIPENEM <=0.25 SENSITIVE Sensitive     NITROFURANTOIN <=16 SENSITIVE Sensitive     TRIMETH/SULFA <=20 SENSITIVE Sensitive     AMPICILLIN/SULBACTAM <=2 SENSITIVE Sensitive     PIP/TAZO <=4 SENSITIVE Sensitive     Extended ESBL NEGATIVE Sensitive     * >=100,000 COLONIES/mL ESCHERICHIA COLI   Klebsiella pneumoniae - MIC*    AMPICILLIN >=32 RESISTANT Resistant     CEFAZOLIN <=4 SENSITIVE Sensitive     CEFTRIAXONE <=1 SENSITIVE Sensitive     CIPROFLOXACIN <=0.25 SENSITIVE Sensitive     GENTAMICIN <=1 SENSITIVE Sensitive     IMIPENEM <=0.25 SENSITIVE Sensitive     NITROFURANTOIN 64 INTERMEDIATE Intermediate     TRIMETH/SULFA <=20 SENSITIVE Sensitive     AMPICILLIN/SULBACTAM 8 SENSITIVE Sensitive     PIP/TAZO <=4 SENSITIVE Sensitive     Extended ESBL NEGATIVE Sensitive     * >=100,000 COLONIES/mL KLEBSIELLA PNEUMONIAE  SARS Coronavirus 2  [facility] order, Performed in [facility] hospital lab) Nasopharyngeal Nasopharyngeal Swab     Status: None   Collection Time: 11/07/18 12:59 AM   Specimen: Nasopharyngeal Swab  Result Value Ref Range Status   SARS Coronavirus 2 NEGATIVE NEGATIVE Final    Comment: (NOTE) If result is NEGATIVE SARS-CoV-2 target nucleic acids are NOT DETECTED. The SARS-CoV-2 RNA is generally detectable in upper and lower  respiratory specimens during the acute phase of infection. The lowest  concentration of SARS-CoV-2 viral copies this assay can detect is 250  copies / mL. A negative result does not preclude SARS-CoV-2 infection  and should not be used as the sole basis for treatment or other  patient management decisions.  A negative result may occur with  improper specimen collection / handling, submission of specimen other  than nasopharyngeal swab, presence of viral mutation(s) within the  areas targeted by this assay, and inadequate number of viral copies  (<250 copies / mL). A negative result must be combined with clinical  observations, patient history, and epidemiological information. If result is POSITIVE SARS-CoV-2 target nucleic acids are DETECTED. The SARS-CoV-2 RNA is generally detectable in upper and lower  respiratory specimens dur ing the acute phase of infection.  Positive  results are indicative of active infection with SARS-CoV-2.  Clinical  correlation with patient history and other diagnostic information is  necessary to determine patient infection status.  Positive results do  not rule out bacterial infection or co-infection with other viruses. If result is PRESUMPTIVE POSTIVE SARS-CoV-2 nucleic acids MAY BE PRESENT.   A presumptive positive result was obtained on the submitted specimen  and confirmed on repeat testing.  While 2019 novel coronavirus  (SARS-CoV-2) nucleic acids may be present in the submitted sample  additional confirmatory testing may be necessary for  epidemiological  and / or clinical management purposes  to differentiate between  SARS-CoV-2 and other Sarbecovirus currently  known to infect humans.  If clinically indicated additional testing with an alternate test  methodology [phone]) is advised. The SARS-CoV-2 RNA is generally  detectable in upper and lower respiratory sp ecimens during the acute  phase of infection. The expected result is Negative. Fact Sheet for Patients:  BoilerBrush.com.cy Fact Sheet for Healthcare Providers: https://pope.com/ This test is not yet approved or cleared by the [place] FDA and has been authorized for detection and/or diagnosis of SARS-CoV-2 by FDA under an Emergency Use Authorization (EUA).  This EUA will remain in effect (meaning this test can be used) for the duration of the COVID-19 declaration under Section 564(b)(1) of the Act, 21 U.S.C. section 360bbb-3(b)(1), unless the authorization is terminated or revoked sooner. Performed at [facility], [address] [place], [place] [address]   MRSA PCR Screening     Status: None   Collection Time: 11/07/18  5:18 AM   Specimen: Nasopharyngeal  Result Value Ref Range Status   MRSA by PCR NEGATIVE NEGATIVE Final    Comment:        The GeneXpert MRSA Assay (FDA approved for NASAL specimens only), is one component of a comprehensive MRSA colonization surveillance program. It is not intended to diagnose MRSA infection nor to guide or monitor treatment for MRSA infections. Performed at [facility], [address] [place] [place], [place] [address]        Today   Subjective:   Lacey Meyer today has no headache,no chest abdominal pain,no new weakness tingling or numbness, feels much better wants to go home today.   Objective:   Blood pressure (!) 173/77, pulse 79, temperature 98.1 F (36.7 C), temperature source Oral, resp. rate 18, height 5\' 2"  (1.575 m), weight  128 kg, SpO2 98 %.  No intake or output data in the 24 hours ending 11/11/18 1544  Exam Awake Alert, Oriented x 3, No new F.N deficits, Normal affect [place].AT,PERRAL Supple Neck,No JVD, No cervical lymphadenopathy appriciated.  Symmetrical Chest wall movement, Good air movement bilaterally, CTAB RRR,No Gallops,Rubs or new Murmurs, No Parasternal Heave +ve B.Sounds, Abd Soft, Non tender, No organomegaly appriciated, No rebound -guarding or rigidity. No Cyanosis, Clubbing or edema, No new Rash or bruise  Data Review   CBC w Diff:  Lab Results  Component Value Date   WBC 5.6 11/08/2018   HGB 11.0 (L) 11/08/2018   HCT 33.7 (L) 11/08/2018   PLT 105 (L) 11/08/2018   LYMPHOPCT 18 11/07/2018   MONOPCT 3 11/07/2018   EOSPCT 2 11/07/2018   BASOPCT 1 11/07/2018    CMP:  Lab Results  Component Value Date   NA 138 11/08/2018   NA 137 11/03/2010   K 4.2 11/08/2018   CL 108 11/08/2018   CO2 22 11/08/2018   BUN 12 11/08/2018   BUN 13 11/03/2010   CREATININE 0.87 11/08/2018   CREATININE 0.61 10/03/2011   GLU 166 11/03/2010   PROT 8.4 (H) 11/07/2018   ALBUMIN 4.1 11/07/2018   BILITOT 0.7 11/07/2018   ALKPHOS 104 11/07/2018   AST 40 11/07/2018   ALT 31 11/07/2018  .   Total Time in preparing paper work, data evaluation and todays exam - 35 minutes  Katha Hamming M.D on8/31/2020 at 3:44 PM    Note: This dictation was prepared with Dragon dictation along with smaller phrase technology. Any transcriptional errors that result from this process are unintentional.

## 2018-11-12 LAB — CULTURE, BLOOD (ROUTINE X 2)
Culture: NO GROWTH
Special Requests: ADEQUATE

## 2018-11-14 LAB — CULTURE, BLOOD (ROUTINE X 2): Special Requests: ADEQUATE

## 2018-12-03 ENCOUNTER — Encounter: Payer: Self-pay | Admitting: Podiatry

## 2018-12-03 ENCOUNTER — Other Ambulatory Visit: Payer: Self-pay

## 2018-12-03 ENCOUNTER — Ambulatory Visit (INDEPENDENT_AMBULATORY_CARE_PROVIDER_SITE_OTHER): Payer: Medicare Other | Admitting: Podiatry

## 2018-12-03 DIAGNOSIS — M79675 Pain in left toe(s): Secondary | ICD-10-CM

## 2018-12-03 DIAGNOSIS — M79674 Pain in right toe(s): Secondary | ICD-10-CM

## 2018-12-03 DIAGNOSIS — E1169 Type 2 diabetes mellitus with other specified complication: Secondary | ICD-10-CM

## 2018-12-03 DIAGNOSIS — E669 Obesity, unspecified: Secondary | ICD-10-CM

## 2018-12-03 DIAGNOSIS — B351 Tinea unguium: Secondary | ICD-10-CM

## 2018-12-03 NOTE — Progress Notes (Signed)
Complaint:  Visit Type: Patient returns to my office for continued preventative foot care services. Complaint: Patient states" my nails have grown long and thick and become painful to walk and wear shoes" Patient has been diagnosed with DM with no foot complications. The patient presents for preventative foot care services. No changes to ROS.  Patient was in hospital with 104 degree temperature.  Podiatric Exam: Vascular: dorsalis pedis and posterior tibial pulses are palpable bilateral. Capillary return is immediate. Temperature gradient is WNL. Skin turgor WNL  Sensorium: Diminished  Semmes Weinstein monofilament test. Normal tactile sensation bilaterally. Nail Exam: Pt has thick disfigured discolored nails with subungual debris noted bilateral entire nail hallux through fifth toenails Ulcer Exam: There is no evidence of ulcer or pre-ulcerative changes or infection. Orthopedic Exam: Muscle tone and strength are WNL. No limitations in general ROM. No crepitus or effusions noted. Foot type and digits show no abnormalities. Bony prominences are unremarkable. Skin: No Porokeratosis. No infection or ulcers  Diagnosis:  Onychomycosis, , Pain in right toe, pain in left toes  Treatment & Plan Procedures and Treatment: Consent by patient was obtained for treatment procedures.   Debridement of mycotic and hypertrophic toenails, 1 through 5 bilateral and clearing of subungual debris. No ulceration, no infection noted.  Return Visit-Office Procedure: Patient instructed to return to the office for a follow up visit 3 months for continued evaluation and treatment.    Gardiner Barefoot DPM

## 2018-12-11 ENCOUNTER — Telehealth: Payer: Self-pay | Admitting: Podiatry

## 2018-12-11 ENCOUNTER — Ambulatory Visit: Payer: Medicare Other

## 2018-12-11 ENCOUNTER — Ambulatory Visit (INDEPENDENT_AMBULATORY_CARE_PROVIDER_SITE_OTHER): Payer: Medicare Other | Admitting: Podiatry

## 2018-12-11 ENCOUNTER — Other Ambulatory Visit: Payer: Self-pay

## 2018-12-11 ENCOUNTER — Ambulatory Visit (INDEPENDENT_AMBULATORY_CARE_PROVIDER_SITE_OTHER): Payer: Medicare Other

## 2018-12-11 ENCOUNTER — Encounter: Payer: Self-pay | Admitting: Podiatry

## 2018-12-11 ENCOUNTER — Encounter: Payer: Medicare Other | Admitting: Podiatry

## 2018-12-11 DIAGNOSIS — S99922A Unspecified injury of left foot, initial encounter: Secondary | ICD-10-CM | POA: Diagnosis not present

## 2018-12-11 DIAGNOSIS — S91332A Puncture wound without foreign body, left foot, initial encounter: Secondary | ICD-10-CM

## 2018-12-11 DIAGNOSIS — L02619 Cutaneous abscess of unspecified foot: Secondary | ICD-10-CM | POA: Insufficient documentation

## 2018-12-11 DIAGNOSIS — L03119 Cellulitis of unspecified part of limb: Secondary | ICD-10-CM

## 2018-12-11 NOTE — Progress Notes (Signed)
This encounter was created in error - please disregard.

## 2018-12-11 NOTE — Telephone Encounter (Signed)
Pt as instructed to call back and let the doctor know what antibiotic she is currently taking.   Cephalexin 500mg  4x daily

## 2018-12-11 NOTE — Progress Notes (Signed)
This patient presents the office with with chief complaint of having stepped on a screw 4 days ago.  She says the screw came through her shoe and embedded in the bottom of her left foot.  She says that her husband then removed the shoe and the screw.  She says she contacted her PCP who wrote a prescription for cephalexin which she is presently taking.  She presents the office today with an antalgic gait and attempting not to put any weight on her left foot due to pain.  She says that there has been drainage coming from the site of the foreign body entrance.  That this patient is diabetic with significant neuropathy.  She presents the office today for an evaluation and treatment of her painful left foot.  General Appearance  Alert, conversant and in no acute stress.  Vascular  Dorsalis pedis and posterior tibial  pulses are palpable  bilaterally.  Capillary return is within normal limits  bilaterally. Temperature is within normal limits  bilaterally.  Neurologic  Senn-Weinstein monofilament wire test diminished /absent feet  B/l.  Muscle power within normal limits bilaterally.  Nails Thick disfigured discolored nails with subungual debris  from hallux to fifth toes bilaterally. No evidence of bacterial infection or drainage bilaterally.  Orthopedic  No limitations of motion  feet .  No crepitus or effusions noted.  No bony pathology or digital deformities noted. Fused IPJ left hallux.  Skin  normotropic skin with no porokeratosis noted bilaterally.  No signs of infections or ulcers noted.  Hagged hole in the center of her left arch.  Mild redness noted around the fb entry site.  No evidence of pus noted.    Infected left foot due to FB .  ROV.  X-rays taken revealing no evidence of any foreign body presently in her foot.  Discussed this condition with this patient.  Told her I would like her to be evaluated by 1 of the podiatric surgeons in the practice.  I was unable to schedule her for an  appointment in [facility] tomorrow.  I did ask Angie to call her tomorrow to check on her status.  I told this patient it would be beneficial to receive a tetanus shot.  I told her to continue taking the antibiotics.  I told her she would benefit from walking with crutches or at least a cam walker.  Her left foot was bandaged with Neosporin and a sterile dry sterile dressing.  Patient will be seen in [place] on Monday. If this condition worsens or becomes very painful, the patient was told to contact this office or go to the Emergency Department at the hospital.  Gardiner Barefoot DPM

## 2018-12-12 ENCOUNTER — Ambulatory Visit (INDEPENDENT_AMBULATORY_CARE_PROVIDER_SITE_OTHER): Payer: Medicare Other | Admitting: Podiatry

## 2018-12-12 ENCOUNTER — Encounter: Payer: Self-pay | Admitting: Podiatry

## 2018-12-12 ENCOUNTER — Ambulatory Visit (INDEPENDENT_AMBULATORY_CARE_PROVIDER_SITE_OTHER): Payer: Medicare Other

## 2018-12-12 ENCOUNTER — Other Ambulatory Visit: Payer: Self-pay | Admitting: Podiatry

## 2018-12-12 DIAGNOSIS — M79671 Pain in right foot: Secondary | ICD-10-CM

## 2018-12-12 DIAGNOSIS — T148XXA Other injury of unspecified body region, initial encounter: Secondary | ICD-10-CM

## 2018-12-12 MED ORDER — CLINDAMYCIN HCL 300 MG PO CAPS: 300.0000 mg | ORAL_CAPSULE | Freq: Three times a day (TID) | ORAL | 0 refills | Status: AC

## 2018-12-12 MED ORDER — CIPROFLOXACIN HCL 500 MG PO TABS: 500.0000 mg | ORAL_TABLET | Freq: Two times a day (BID) | ORAL | 0 refills | Status: AC

## 2018-12-12 NOTE — Progress Notes (Signed)
Subjective:  Patient ID: Lacey Meyer, female    DOB: 01-29-1961,  MRN: [id number]  Chief Complaint  Patient presents with  . Foot Pain    pt is here for foot pain in her left foot foreign object    58 y.o. female presents for wound care. There is plantar puncture wound noted on the left foot.  She has been just doing regular dressing changes since he was seen yesterday.  She states that there is mild tenderness around the puncture site.  Her last A1c was 10.6.  She states that she has been maintaining her sugars.  Denies any nausea fever chills vomiting shortness of breath.   Review of Systems: Negative except as noted in the HPI. Denies N/V/F/Ch.  Past Medical History:  Diagnosis Date  . Anemia   . Arthritis   . Diabetes mellitus without complication ([place])   . GERD (gastroesophageal reflux disease)   . Gout   . History of blood transfusion   . HTN (hypertension)   . Impaired vision     Current Outpatient Medications:  .  ACCU-CHEK GUIDE test strip, USE TO TEST THREE TIMES DAILY (E11.65), Disp: , Rfl:  .  allopurinol (ZYLOPRIM) 300 MG tablet, Take by mouth., Disp: , Rfl:  .  ALPRAZolam (XANAX) 0.25 MG tablet, Take 0.25 mg by mouth daily as needed for anxiety., Disp: , Rfl:  .  B-D UF III MINI PEN NEEDLES 31G X 5 MM MISC, , Disp: , Rfl:  .  benazepril (LOTENSIN) 10 MG tablet, Take 10 mg by mouth daily., Disp: , Rfl:  .  cephALEXin (KEFLEX) 500 MG capsule, , Disp: , Rfl:  .  colchicine 0.6 MG tablet, Take by mouth., Disp: , Rfl:  .  diclofenac (VOLTAREN) 75 MG EC tablet, Take by mouth., Disp: , Rfl:  .  diclofenac sodium (VOLTAREN) 1 % GEL, APPLY 4 GRAMS 4 TIMES A DAY AS NEEDED FOR PAINS, Disp: , Rfl: 5 .  diphenoxylate-atropine (LOMOTIL) 2.5-0.025 MG tablet, TAKE 1 TABLET BY MOUTH EVERY 6 HOURS AS NEEDED FOR DIARRHEA, Disp: , Rfl:  .  DULoxetine (CYMBALTA) 60 MG capsule, Take 60 mg by mouth daily. , Disp: , Rfl:  .  ferrous sulfate 325 (65 FE) MG tablet, Take by mouth.,  Disp: , Rfl:  .  fluticasone (FLONASE) 50 MCG/ACT nasal spray, USE 2 SPRAY(S) IN EACH NOSTRIL DAILY, Disp: , Rfl: 11 .  furosemide (LASIX) 40 MG tablet, Take 1 tablet (40 mg total) by mouth daily., Disp: , Rfl:  .  glimepiride (AMARYL) 4 MG tablet, Take 4 mg by mouth 2 (two) times daily. , Disp: , Rfl:  .  Lancets (ONETOUCH DELICA PLUS [id number]) MISC, CHECK GLUCOSE 3 TIMES A DAY, Disp: , Rfl: 2 .  LYRICA 200 MG capsule, Take 200 mg by mouth 4 (four) times daily. , Disp: , Rfl:  .  meloxicam (MOBIC) 15 MG tablet, TAKE 1 TABLET BY MOUTH ONCE DAILY FOR 30 DAYS, Disp: , Rfl:  .  metFORMIN (GLUCOPHAGE) 500 MG tablet, Take 500 mg by mouth 2 (two) times daily. , Disp: , Rfl:  .  metoprolol succinate (TOPROL-XL) 50 MG 24 hr tablet, Take 50 mg by mouth daily. , Disp: , Rfl:  .  Multiple Vitamins-Minerals (BARIATRIC FUSION PO), Take 1 tablet by mouth daily., Disp: , Rfl:  .  NARCAN 4 MG/0.1ML LIQD nasal spray kit, PLEASE SEE ATTACHED FOR DETAILED DIRECTIONS, Disp: , Rfl:  .  nortriptyline (PAMELOR) 50 MG capsule, TAKE 1  CAPSULE BY MOUTH DAILY AT BEDTIME, Disp: , Rfl:  .  NOVOLOG FLEXPEN 100 UNIT/ML FlexPen, Inject 2-15 Units into the skin 3 (three) times daily with meals. , Disp: , Rfl:  .  Olopatadine HCl 0.2 % SOLN, INSTILL 1 DROP IN EACH EYE DAILY AS NEEDED, Disp: , Rfl: 2 .  ondansetron (ZOFRAN-ODT) 4 MG disintegrating tablet, DISSOLVE 1 TABLET IN MOUTH EVERY 8 HOURS AS NEEDED FOR UP TO 7 DAYS FOR NAUSEA OR VOMITING, Disp: , Rfl:  .  Oxycodone HCl 10 MG TABS, Take 10 mg by mouth every 8 (eight) hours as needed., Disp: , Rfl:  .  pantoprazole (PROTONIX) 40 MG tablet, , Disp: , Rfl:  .  simvastatin (ZOCOR) 40 MG tablet, Take 20 mg by mouth daily., Disp: , Rfl:  .  spironolactone (ALDACTONE) 25 MG tablet, Take by mouth., Disp: , Rfl:  .  tiZANidine (ZANAFLEX) 2 MG tablet, TAKE 1 TABLET BY MOUTH AT BEDTIME AS NEEDED FOR PAINS, Disp: , Rfl: 2 .  triamcinolone cream (KENALOG) 0.5 %, APPLY CREAM TOPICALLY TO  LEGS TWICE A DAY AS NEEDED, Disp: , Rfl: 2 .  venlafaxine XR (EFFEXOR-XR) 150 MG 24 hr capsule, Take 150 mg by mouth daily with breakfast., Disp: , Rfl:  .  XULTOPHY 100-3.6 UNIT-MG/ML SOPN, INJECT 20 UNITS SUBCUTANEOUSLY ONCE DAILY IN THE MORNING, Disp: , Rfl: 5 .  zolpidem (AMBIEN) 10 MG tablet, Take 10 mg by mouth at bedtime as needed., Disp: , Rfl: 5 .  ciprofloxacin (CIPRO) 500 MG tablet, Take 1 tablet (500 mg total) by mouth 2 (two) times daily for 14 days., Disp: 28 tablet, Rfl: 0 .  clindamycin (CLEOCIN) 300 MG capsule, Take 1 capsule (300 mg total) by mouth 3 (three) times daily for 14 days., Disp: 42 capsule, Rfl: 0 .  omeprazole (PRILOSEC) 20 MG capsule, Take 1 capsule (20 mg total) by mouth daily., Disp: 30 capsule, Rfl: 0  Social History   Tobacco Use  Smoking Status Former Smoker  . Packs/day: 0.25  . Years: 1.00  . Pack years: 0.25  . Types: Cigarettes  . Quit date: 03/06/2010  . Years since quitting: 8.7  Smokeless Tobacco Never Used    Allergies  Allergen Reactions  . Ampicillin Anaphylaxis, Hives and Other (See Comments)    Has patient had a PCN reaction causing immediate rash, facial/tongue/throat swelling, SOB or lightheadedness with hypotension: Yes Has patient had a PCN reaction causing severe rash involving mucus membranes or skin necrosis: Yes Has patient had a PCN reaction that required hospitalization Yes Has patient had a PCN reaction occurring within the last 10 years: No If all of the above answers are "NO", then may proceed with Cephalosporin use.   Marland Kitchen Penicillins Anaphylaxis, Hives and Nausea And Vomiting    Has patient had a PCN reaction causing immediate rash, facial/tongue/throat swelling, SOB or lightheadedness with hypotension: Yes Has patient had a PCN reaction causing severe rash involving mucus membranes or skin necrosis: Yes Has patient had a PCN reaction that required hospitalization Yes Has patient had a PCN reaction occurring within the last  10 years: No If all of the above answers are "NO", then may proceed with Cephalosporin use. Has patient had a PCN reaction causing immediate rash, facial/tongue/throat swelling, SOB or lightheadedness with hypotension: Yes Has patient had a PCN reaction causing severe rash involving mucus membranes or skin necrosis: Yes Has patient had a PCN reaction that required hospitalization Yes Has patient had a PCN reaction occurring within the  last 10 years: No If all of the above answers are "NO", then may proceed with Cephalosporin use.  Marland Kitchen Doxycycline Other (See Comments)    unknown unknown unknown  . Other Swelling, Rash and Other (See Comments)    Blistex Blistex Blistex   Objective:  There were no vitals filed for this visit. There is no height or weight on file to calculate BMI. Constitutional Well developed. Well nourished.  Vascular Dorsalis pedis pulses palpable bilaterally. Posterior tibial pulses palpable bilaterally. Capillary refill normal to all digits.  No cyanosis or clubbing noted. Pedal hair growth normal.  Neurologic Normal speech. Oriented to person, place, and time. Protective sensation absent.  Symes Weinstein filament negative.  Protective and sensory neuropathy noted.  Dermatologic  1 cm x 1 cm plantar midfoot puncture wound.  Pain on palpation with tenderness around the puncture site.  No purulent drainage noted.  No malodor or or any other clinical signs of infection noted.  Erythema present less than 2 cm.  Orthopedic: Pain on palpation to the wound site mild   Radiographs: Of soft tissue emphysema noted no foreign body noted. Assessment:   1. Foot pain, right    Plan:  Patient was evaluated and treated and all questions answered.  Ulcer 1x2 cm puncture wound --The wound is stable.  No clinical signs of infection noted such as purulence or malodor.  At this time I determined that it was better to keep an eye out on the wound.  I instructed and educated the  patient to watch out for any clinical signs of infection around the wound at least twice a day. -Patient will continue to apply triple antibiotic and a Band-Aid to the wound site. -I told her if there is any cellulitis or any drainage coming out of the wound to call immediately or go to the nearest emergency room. -I changed her antibiotics from Keflex to Clinda Cipro.  Given the nature of the injury with a nail through his shoe I felt that it was necessary to cover for Pseudomonas.  Patient can be weightbearing to the heel if needed however I instructed her to be nonweightbearing as much as possible to the right lower extremity..   Return in about 1 week (around 12/19/2018).

## 2018-12-15 ENCOUNTER — Ambulatory Visit: Payer: Medicare Other | Admitting: Podiatry

## 2018-12-16 ENCOUNTER — Telehealth: Payer: Self-pay | Admitting: Podiatry

## 2018-12-16 NOTE — Telephone Encounter (Addendum)
Pt states she took the last pills yesterday and has not had any antibiotics today. I gave pt's husband my email address to send photo of her foot. I told pt that if she continued to have the stomach upset, she should contact her PCP, but to start the Banana, white Rice, Applesauce, Toast diet to allow her stomach to rest. Pt's husband states the foot has some white puffy flesh around the wound but otherwise looks good, will email photo to my [facility] email address.

## 2018-12-16 NOTE — Telephone Encounter (Signed)
Pt called states she was give 2 antibiotics 1 week ago and now has vomiting, diarrhea, and acid reflux, but the foot that she stepped on a screw, looks good.

## 2018-12-16 NOTE — Telephone Encounter (Signed)
Pt was seen in office on 10/2 and was prescribed ciprofloxacin and clindamycin and has had a reaction to the medications. Pt states she has severe acid reflux and is having diarrhea and vomiting. Pt would like to know if there is anything else that can be prescribed. Please give patient a call.

## 2018-12-16 NOTE — Telephone Encounter (Signed)
Lacey Meyer, PT states pt is having vomiting and diarrhea since beginning the clindamycin, and cipro, and pt has stopped the medications.

## 2018-12-19 ENCOUNTER — Other Ambulatory Visit: Payer: Self-pay

## 2018-12-19 ENCOUNTER — Ambulatory Visit (INDEPENDENT_AMBULATORY_CARE_PROVIDER_SITE_OTHER): Payer: Medicare Other | Admitting: Podiatry

## 2018-12-19 DIAGNOSIS — E669 Obesity, unspecified: Secondary | ICD-10-CM

## 2018-12-19 DIAGNOSIS — E1169 Type 2 diabetes mellitus with other specified complication: Secondary | ICD-10-CM

## 2018-12-19 DIAGNOSIS — T148XXA Other injury of unspecified body region, initial encounter: Secondary | ICD-10-CM

## 2018-12-19 DIAGNOSIS — S99922A Unspecified injury of left foot, initial encounter: Secondary | ICD-10-CM | POA: Diagnosis not present

## 2018-12-20 ENCOUNTER — Encounter: Payer: Self-pay | Admitting: Podiatry

## 2018-12-20 NOTE — Progress Notes (Signed)
Subjective:  Patient ID: Lacey Meyer, female    DOB: August 15, 1960,  MRN: [id number]  Chief Complaint  Patient presents with  . Foot Pain    pt is here for a f/u on the left foot, pt states that she is doing alot better, but is concerned that the meds she recieved caused her to vomit    58 y.o. female presents for wound care.  She states that she is doing a lot better in terms of the plantar wound care of the left foot.  She did not complete her antibiotic course as it was causing her a lot of gastric issues.  This is a 1 week follow-up from a plantar puncture wound with a nail through her foot via the shoe.  Her ulceration is improving considerably.   Review of Systems: Negative except as noted in the HPI. Denies N/V/F/Ch.  Past Medical History:  Diagnosis Date  . Anemia   . Arthritis   . Diabetes mellitus without complication ([place])   . GERD (gastroesophageal reflux disease)   . Gout   . History of blood transfusion   . HTN (hypertension)   . Impaired vision     Current Outpatient Medications:  .  ACCU-CHEK GUIDE test strip, USE TO TEST THREE TIMES DAILY (E11.65), Disp: , Rfl:  .  allopurinol (ZYLOPRIM) 300 MG tablet, Take by mouth., Disp: , Rfl:  .  ALPRAZolam (XANAX) 0.25 MG tablet, Take 0.25 mg by mouth daily as needed for anxiety., Disp: , Rfl:  .  B-D UF III MINI PEN NEEDLES 31G X 5 MM MISC, , Disp: , Rfl:  .  benazepril (LOTENSIN) 10 MG tablet, Take 10 mg by mouth daily., Disp: , Rfl:  .  cephALEXin (KEFLEX) 500 MG capsule, , Disp: , Rfl:  .  ciprofloxacin (CIPRO) 500 MG tablet, Take 1 tablet (500 mg total) by mouth 2 (two) times daily for 14 days., Disp: 28 tablet, Rfl: 0 .  clindamycin (CLEOCIN) 300 MG capsule, Take 1 capsule (300 mg total) by mouth 3 (three) times daily for 14 days., Disp: 42 capsule, Rfl: 0 .  colchicine 0.6 MG tablet, Take by mouth., Disp: , Rfl:  .  diclofenac (VOLTAREN) 75 MG EC tablet, Take by mouth., Disp: , Rfl:  .  diclofenac sodium (VOLTAREN)  1 % GEL, APPLY 4 GRAMS 4 TIMES A DAY AS NEEDED FOR PAINS, Disp: , Rfl: 5 .  diphenoxylate-atropine (LOMOTIL) 2.5-0.025 MG tablet, TAKE 1 TABLET BY MOUTH EVERY 6 HOURS AS NEEDED FOR DIARRHEA, Disp: , Rfl:  .  DULoxetine (CYMBALTA) 60 MG capsule, Take 60 mg by mouth daily. , Disp: , Rfl:  .  ferrous sulfate 325 (65 FE) MG tablet, Take by mouth., Disp: , Rfl:  .  fluticasone (FLONASE) 50 MCG/ACT nasal spray, USE 2 SPRAY(S) IN EACH NOSTRIL DAILY, Disp: , Rfl: 11 .  furosemide (LASIX) 40 MG tablet, Take 1 tablet (40 mg total) by mouth daily., Disp: , Rfl:  .  glimepiride (AMARYL) 4 MG tablet, Take 4 mg by mouth 2 (two) times daily. , Disp: , Rfl:  .  Lancets (ONETOUCH DELICA PLUS [id number]) MISC, CHECK GLUCOSE 3 TIMES A DAY, Disp: , Rfl: 2 .  LYRICA 200 MG capsule, Take 200 mg by mouth 4 (four) times daily. , Disp: , Rfl:  .  meloxicam (MOBIC) 15 MG tablet, TAKE 1 TABLET BY MOUTH ONCE DAILY FOR 30 DAYS, Disp: , Rfl:  .  metFORMIN (GLUCOPHAGE) 500 MG tablet, Take 500 mg by  mouth 2 (two) times daily. , Disp: , Rfl:  .  metoprolol succinate (TOPROL-XL) 50 MG 24 hr tablet, Take 50 mg by mouth daily. , Disp: , Rfl:  .  Multiple Vitamins-Minerals (BARIATRIC FUSION PO), Take 1 tablet by mouth daily., Disp: , Rfl:  .  NARCAN 4 MG/0.1ML LIQD nasal spray kit, PLEASE SEE ATTACHED FOR DETAILED DIRECTIONS, Disp: , Rfl:  .  nortriptyline (PAMELOR) 50 MG capsule, TAKE 1 CAPSULE BY MOUTH DAILY AT BEDTIME, Disp: , Rfl:  .  NOVOLOG FLEXPEN 100 UNIT/ML FlexPen, Inject 2-15 Units into the skin 3 (three) times daily with meals. , Disp: , Rfl:  .  Olopatadine HCl 0.2 % SOLN, INSTILL 1 DROP IN EACH EYE DAILY AS NEEDED, Disp: , Rfl: 2 .  ondansetron (ZOFRAN-ODT) 4 MG disintegrating tablet, DISSOLVE 1 TABLET IN MOUTH EVERY 8 HOURS AS NEEDED FOR UP TO 7 DAYS FOR NAUSEA OR VOMITING, Disp: , Rfl:  .  Oxycodone HCl 10 MG TABS, Take 10 mg by mouth every 8 (eight) hours as needed., Disp: , Rfl:  .  pantoprazole (PROTONIX) 40 MG  tablet, , Disp: , Rfl:  .  simvastatin (ZOCOR) 40 MG tablet, Take 20 mg by mouth daily., Disp: , Rfl:  .  spironolactone (ALDACTONE) 25 MG tablet, Take by mouth., Disp: , Rfl:  .  tiZANidine (ZANAFLEX) 2 MG tablet, TAKE 1 TABLET BY MOUTH AT BEDTIME AS NEEDED FOR PAINS, Disp: , Rfl: 2 .  triamcinolone cream (KENALOG) 0.5 %, APPLY CREAM TOPICALLY TO LEGS TWICE A DAY AS NEEDED, Disp: , Rfl: 2 .  venlafaxine XR (EFFEXOR-XR) 150 MG 24 hr capsule, Take 150 mg by mouth daily with breakfast., Disp: , Rfl:  .  XULTOPHY 100-3.6 UNIT-MG/ML SOPN, INJECT 20 UNITS SUBCUTANEOUSLY ONCE DAILY IN THE MORNING, Disp: , Rfl: 5 .  zolpidem (AMBIEN) 10 MG tablet, Take 10 mg by mouth at bedtime as needed., Disp: , Rfl: 5 .  omeprazole (PRILOSEC) 20 MG capsule, Take 1 capsule (20 mg total) by mouth daily., Disp: 30 capsule, Rfl: 0  Social History   Tobacco Use  Smoking Status Former Smoker  . Packs/day: 0.25  . Years: 1.00  . Pack years: 0.25  . Types: Cigarettes  . Quit date: 03/06/2010  . Years since quitting: 8.7  Smokeless Tobacco Never Used    Allergies  Allergen Reactions  . Ampicillin Anaphylaxis, Hives and Other (See Comments)    Has patient had a PCN reaction causing immediate rash, facial/tongue/throat swelling, SOB or lightheadedness with hypotension: Yes Has patient had a PCN reaction causing severe rash involving mucus membranes or skin necrosis: Yes Has patient had a PCN reaction that required hospitalization Yes Has patient had a PCN reaction occurring within the last 10 years: No If all of the above answers are "NO", then may proceed with Cephalosporin use.   Marland Kitchen Penicillins Anaphylaxis, Hives and Nausea And Vomiting    Has patient had a PCN reaction causing immediate rash, facial/tongue/throat swelling, SOB or lightheadedness with hypotension: Yes Has patient had a PCN reaction causing severe rash involving mucus membranes or skin necrosis: Yes Has patient had a PCN reaction that required  hospitalization Yes Has patient had a PCN reaction occurring within the last 10 years: No If all of the above answers are "NO", then may proceed with Cephalosporin use. Has patient had a PCN reaction causing immediate rash, facial/tongue/throat swelling, SOB or lightheadedness with hypotension: Yes Has patient had a PCN reaction causing severe rash involving mucus membranes or  skin necrosis: Yes Has patient had a PCN reaction that required hospitalization Yes Has patient had a PCN reaction occurring within the last 10 years: No If all of the above answers are "NO", then may proceed with Cephalosporin use.  Marland Kitchen Doxycycline Other (See Comments)    unknown unknown unknown  . Other Swelling, Rash and Other (See Comments)    Blistex Blistex Blistex   Objective:  There were no vitals filed for this visit. There is no height or weight on file to calculate BMI. Constitutional Well developed. Well nourished.  Vascular Dorsalis pedis pulses palpable bilaterally. Posterior tibial pulses palpable bilaterally. Capillary refill normal to all digits.  No cyanosis or clubbing noted. Pedal hair growth normal.  Neurologic Normal speech. Oriented to person, place, and time. Protective sensation absent  Dermatologic Wound Location: Left plantar midfoot 0.8 cm x 0.8 cm plantar midfoot puncture wound.  Pain on palpation with tenderness around the puncture site.  No purulent drainage noted.  No malodor or or any other clinical signs of infection noted.  Erythema present less than 2 cm.  Macerated periwound  Orthopedic: No pain to palpation either foot.   Radiographs: None Assessment:   1. Diabetes mellitus type 2 in obese ([place])   2. Puncture wound    Plan:  Patient was evaluated and treated and all questions answered.  Ulcer left plantar midfoot -Debridement as below. -Dressed with Betadine and a Band-Aid. -Patient will continue offloading with a cam boot.  Cam boot was dispensed. -The wound as  described below is improving dramatically.  The wound base is mostly granular.  Mild maceration of the periwound for which Betadine wet-to-dry dressings are recommended at this time.  I will see her back again in 1 weeks.  Procedure: Excisional Debridement of Wound Rationale: Removal of non-viable soft tissue from the wound to promote healing.  Anesthesia: none Pre-Debridement Wound Measurements: 1 cm x 1 cm x 0.4 cm  Post-Debridement Wound Measurements: 0.8 cm x 0.8 cm x 0.3 cm  Type of Debridement: Sharp Excisional Tissue Removed: Non-viable soft tissue Depth of Debridement: subcutaneous tissue. Technique: Sharp excisional debridement to bleeding, viable wound base.  Dressing: Dry, sterile, compression dressing. Disposition: Patient tolerated procedure well. Patient to return in 1 week for follow-up.  No follow-ups on file.

## 2018-12-26 ENCOUNTER — Ambulatory Visit (INDEPENDENT_AMBULATORY_CARE_PROVIDER_SITE_OTHER): Payer: Medicare Other | Admitting: Podiatry

## 2018-12-26 ENCOUNTER — Other Ambulatory Visit: Payer: Self-pay

## 2018-12-26 DIAGNOSIS — T148XXA Other injury of unspecified body region, initial encounter: Secondary | ICD-10-CM | POA: Diagnosis not present

## 2018-12-26 DIAGNOSIS — E1140 Type 2 diabetes mellitus with diabetic neuropathy, unspecified: Secondary | ICD-10-CM | POA: Diagnosis not present

## 2018-12-26 DIAGNOSIS — Z794 Long term (current) use of insulin: Secondary | ICD-10-CM | POA: Diagnosis not present

## 2018-12-28 ENCOUNTER — Encounter: Payer: Self-pay | Admitting: Podiatry

## 2018-12-28 NOTE — Progress Notes (Signed)
Subjective:  Patient ID: Lacey Meyer, female    DOB: 05-Feb-1961,  MRN: [id number]  Chief Complaint  Patient presents with  . Foot Pain    pt is here for ulcer of the left foot, pt states that the ulcer is looking a lot better, pt also states that she cannot wear cam boot due to "balance issue"    58 y.o. female presents for wound care.  She states that the wound on the left foot is healing really well.  She states that she is doing a lot better.  No signs of infection.  She has been changing her bandage every day.  Patient states that she cannot wear her cam boot because she has balance issues.  I told her that we will dispense her a postop shoe.  Which will help offload the ulcer as well.  The ulcer is going down in size.  She denies any nausea fever chills vomiting.   Review of Systems: Negative except as noted in the HPI. Denies N/V/F/Ch.  Past Medical History:  Diagnosis Date  . Anemia   . Arthritis   . Diabetes mellitus without complication ([place])   . GERD (gastroesophageal reflux disease)   . Gout   . History of blood transfusion   . HTN (hypertension)   . Impaired vision     Current Outpatient Medications:  .  ACCU-CHEK GUIDE test strip, USE TO TEST THREE TIMES DAILY (E11.65), Disp: , Rfl:  .  allopurinol (ZYLOPRIM) 300 MG tablet, Take by mouth., Disp: , Rfl:  .  ALPRAZolam (XANAX) 0.25 MG tablet, Take 0.25 mg by mouth daily as needed for anxiety., Disp: , Rfl:  .  B-D UF III MINI PEN NEEDLES 31G X 5 MM MISC, , Disp: , Rfl:  .  benazepril (LOTENSIN) 10 MG tablet, Take 10 mg by mouth daily., Disp: , Rfl:  .  cephALEXin (KEFLEX) 500 MG capsule, , Disp: , Rfl:  .  colchicine 0.6 MG tablet, Take by mouth., Disp: , Rfl:  .  diclofenac (VOLTAREN) 75 MG EC tablet, Take by mouth., Disp: , Rfl:  .  diclofenac sodium (VOLTAREN) 1 % GEL, APPLY 4 GRAMS 4 TIMES A DAY AS NEEDED FOR PAINS, Disp: , Rfl: 5 .  diphenoxylate-atropine (LOMOTIL) 2.5-0.025 MG tablet, TAKE 1 TABLET BY MOUTH  EVERY 6 HOURS AS NEEDED FOR DIARRHEA, Disp: , Rfl:  .  DULoxetine (CYMBALTA) 60 MG capsule, Take 60 mg by mouth daily. , Disp: , Rfl:  .  ferrous sulfate 325 (65 FE) MG tablet, Take by mouth., Disp: , Rfl:  .  fluticasone (FLONASE) 50 MCG/ACT nasal spray, USE 2 SPRAY(S) IN EACH NOSTRIL DAILY, Disp: , Rfl: 11 .  furosemide (LASIX) 40 MG tablet, Take 1 tablet (40 mg total) by mouth daily., Disp: , Rfl:  .  glimepiride (AMARYL) 4 MG tablet, Take 4 mg by mouth 2 (two) times daily. , Disp: , Rfl:  .  Lancets (ONETOUCH DELICA PLUS [id number]) MISC, CHECK GLUCOSE 3 TIMES A DAY, Disp: , Rfl: 2 .  LYRICA 200 MG capsule, Take 200 mg by mouth 4 (four) times daily. , Disp: , Rfl:  .  meloxicam (MOBIC) 15 MG tablet, TAKE 1 TABLET BY MOUTH ONCE DAILY FOR 30 DAYS, Disp: , Rfl:  .  metFORMIN (GLUCOPHAGE) 500 MG tablet, Take 500 mg by mouth 2 (two) times daily. , Disp: , Rfl:  .  metoprolol succinate (TOPROL-XL) 50 MG 24 hr tablet, Take 50 mg by mouth daily. , Disp: ,  Rfl:  .  Multiple Vitamins-Minerals (BARIATRIC FUSION PO), Take 1 tablet by mouth daily., Disp: , Rfl:  .  NARCAN 4 MG/0.1ML LIQD nasal spray kit, PLEASE SEE ATTACHED FOR DETAILED DIRECTIONS, Disp: , Rfl:  .  nortriptyline (PAMELOR) 50 MG capsule, TAKE 1 CAPSULE BY MOUTH DAILY AT BEDTIME, Disp: , Rfl:  .  NOVOLOG FLEXPEN 100 UNIT/ML FlexPen, Inject 2-15 Units into the skin 3 (three) times daily with meals. , Disp: , Rfl:  .  Olopatadine HCl 0.2 % SOLN, INSTILL 1 DROP IN EACH EYE DAILY AS NEEDED, Disp: , Rfl: 2 .  ondansetron (ZOFRAN-ODT) 4 MG disintegrating tablet, DISSOLVE 1 TABLET IN MOUTH EVERY 8 HOURS AS NEEDED FOR UP TO 7 DAYS FOR NAUSEA OR VOMITING, Disp: , Rfl:  .  Oxycodone HCl 10 MG TABS, Take 10 mg by mouth every 8 (eight) hours as needed., Disp: , Rfl:  .  pantoprazole (PROTONIX) 40 MG tablet, , Disp: , Rfl:  .  simvastatin (ZOCOR) 40 MG tablet, Take 20 mg by mouth daily., Disp: , Rfl:  .  spironolactone (ALDACTONE) 25 MG tablet, Take by  mouth., Disp: , Rfl:  .  tiZANidine (ZANAFLEX) 2 MG tablet, TAKE 1 TABLET BY MOUTH AT BEDTIME AS NEEDED FOR PAINS, Disp: , Rfl: 2 .  triamcinolone cream (KENALOG) 0.5 %, APPLY CREAM TOPICALLY TO LEGS TWICE A DAY AS NEEDED, Disp: , Rfl: 2 .  venlafaxine XR (EFFEXOR-XR) 150 MG 24 hr capsule, Take 150 mg by mouth daily with breakfast., Disp: , Rfl:  .  XULTOPHY 100-3.6 UNIT-MG/ML SOPN, INJECT 20 UNITS SUBCUTANEOUSLY ONCE DAILY IN THE MORNING, Disp: , Rfl: 5 .  zolpidem (AMBIEN) 10 MG tablet, Take 10 mg by mouth at bedtime as needed., Disp: , Rfl: 5 .  omeprazole (PRILOSEC) 20 MG capsule, Take 1 capsule (20 mg total) by mouth daily., Disp: 30 capsule, Rfl: 0  Social History   Tobacco Use  Smoking Status Former Smoker  . Packs/day: 0.25  . Years: 1.00  . Pack years: 0.25  . Types: Cigarettes  . Quit date: 03/06/2010  . Years since quitting: 8.8  Smokeless Tobacco Never Used    Allergies  Allergen Reactions  . Ampicillin Anaphylaxis, Hives and Other (See Comments)    Has patient had a PCN reaction causing immediate rash, facial/tongue/throat swelling, SOB or lightheadedness with hypotension: Yes Has patient had a PCN reaction causing severe rash involving mucus membranes or skin necrosis: Yes Has patient had a PCN reaction that required hospitalization Yes Has patient had a PCN reaction occurring within the last 10 years: No If all of the above answers are "NO", then may proceed with Cephalosporin use.   Marland Kitchen Penicillins Anaphylaxis, Hives and Nausea And Vomiting    Has patient had a PCN reaction causing immediate rash, facial/tongue/throat swelling, SOB or lightheadedness with hypotension: Yes Has patient had a PCN reaction causing severe rash involving mucus membranes or skin necrosis: Yes Has patient had a PCN reaction that required hospitalization Yes Has patient had a PCN reaction occurring within the last 10 years: No If all of the above answers are "NO", then may proceed with  Cephalosporin use. Has patient had a PCN reaction causing immediate rash, facial/tongue/throat swelling, SOB or lightheadedness with hypotension: Yes Has patient had a PCN reaction causing severe rash involving mucus membranes or skin necrosis: Yes Has patient had a PCN reaction that required hospitalization Yes Has patient had a PCN reaction occurring within the last 10 years: No If all of  the above answers are "NO", then may proceed with Cephalosporin use.  Marland Kitchen Doxycycline Other (See Comments)    unknown unknown unknown  . Other Swelling, Rash and Other (See Comments)    Blistex Blistex Blistex   Objective:  There were no vitals filed for this visit. There is no height or weight on file to calculate BMI. Constitutional Well developed. Well nourished.  Vascular Dorsalis pedis pulses palpable bilaterally. Posterior tibial pulses palpable bilaterally. Capillary refill normal to all digits.  No cyanosis or clubbing noted. Pedal hair growth normal.  Neurologic Normal speech. Oriented to person, place, and time. Protective sensation absent  Dermatologic Wound Location: Left midfoot puncture wound Wound Base: Granular/Healthy Peri-wound: Normal Exudate: Scant/small amount Serous exudate Wound Measurements: -0.2 cm x 0.2 cm  Orthopedic: No pain to palpation either foot.   Radiographs: None Assessment:   1. Puncture wound   2. Type 2 diabetes mellitus with diabetic neuropathy, with long-term current use of insulin ([place])    Plan:  Patient was evaluated and treated and all questions answered.  Ulcer left plantar midfoot puncture wound -Debridement as below. -Dressed with triple antibiotic and a Band-Aid, DSD. -Continue off-loading with surgical shoe.  -I have transitioned her to a surgical shoe at this time.  The wound has been doing really well.  It has decreased in size.  I recommended that she start applying triple antibiotic and a Band-Aid to the wound. -I will now start  seeing her every 2 weeks as the wound is almost primarily healed.    No follow-ups on file.

## 2019-01-07 DIAGNOSIS — E1140 Type 2 diabetes mellitus with diabetic neuropathy, unspecified: Secondary | ICD-10-CM | POA: Insufficient documentation

## 2019-01-07 DIAGNOSIS — H548 Legal blindness, as defined in USA: Secondary | ICD-10-CM | POA: Insufficient documentation

## 2019-01-09 ENCOUNTER — Ambulatory Visit (INDEPENDENT_AMBULATORY_CARE_PROVIDER_SITE_OTHER): Payer: Medicare Other | Admitting: Podiatry

## 2019-01-09 ENCOUNTER — Other Ambulatory Visit: Payer: Self-pay

## 2019-01-09 ENCOUNTER — Encounter: Payer: Self-pay | Admitting: Podiatry

## 2019-01-09 DIAGNOSIS — E1169 Type 2 diabetes mellitus with other specified complication: Secondary | ICD-10-CM

## 2019-01-09 DIAGNOSIS — Z794 Long term (current) use of insulin: Secondary | ICD-10-CM | POA: Diagnosis not present

## 2019-01-09 DIAGNOSIS — E1140 Type 2 diabetes mellitus with diabetic neuropathy, unspecified: Secondary | ICD-10-CM

## 2019-01-09 DIAGNOSIS — T148XXA Other injury of unspecified body region, initial encounter: Secondary | ICD-10-CM | POA: Diagnosis not present

## 2019-01-09 DIAGNOSIS — E669 Obesity, unspecified: Secondary | ICD-10-CM

## 2019-01-12 ENCOUNTER — Encounter: Payer: Self-pay | Admitting: Podiatry

## 2019-01-12 NOTE — Progress Notes (Signed)
Subjective:  Patient ID: Lacey Meyer, female    DOB: 09/13/1960,  MRN: [id number]  Chief Complaint  Patient presents with  . Foot Ulcer    Follow up puncture wound plantar midfoot left   "Its looking a lot better", states her husband    58 y.o. female presents with the above complaint.  Patient states that the wound is looking much better.  She is denies any other acute complaints.  She has been doing triple antibiotic and a Band-Aid to the wound site.  However the wound has been about the same size now.  She has been offloading with a surgical shoe.  She denies any nausea fever chills vomiting.   Review of Systems: Negative except as noted in the HPI. Denies N/V/F/Ch.  Past Medical History:  Diagnosis Date  . Anemia   . Arthritis   . Diabetes mellitus without complication ([place])   . GERD (gastroesophageal reflux disease)   . Gout   . History of blood transfusion   . HTN (hypertension)   . Impaired vision     Current Outpatient Medications:  .  ACCU-CHEK GUIDE test strip, USE TO TEST THREE TIMES DAILY (E11.65), Disp: , Rfl:  .  allopurinol (ZYLOPRIM) 300 MG tablet, Take by mouth., Disp: , Rfl:  .  ALPRAZolam (XANAX) 0.25 MG tablet, Take 0.25 mg by mouth daily as needed for anxiety., Disp: , Rfl:  .  B-D UF III MINI PEN NEEDLES 31G X 5 MM MISC, , Disp: , Rfl:  .  benazepril (LOTENSIN) 10 MG tablet, Take 10 mg by mouth daily., Disp: , Rfl:  .  cephALEXin (KEFLEX) 500 MG capsule, , Disp: , Rfl:  .  colchicine 0.6 MG tablet, Take by mouth., Disp: , Rfl:  .  diclofenac (VOLTAREN) 75 MG EC tablet, Take by mouth., Disp: , Rfl:  .  diclofenac sodium (VOLTAREN) 1 % GEL, APPLY 4 GRAMS 4 TIMES A DAY AS NEEDED FOR PAINS, Disp: , Rfl: 5 .  diphenoxylate-atropine (LOMOTIL) 2.5-0.025 MG tablet, TAKE 1 TABLET BY MOUTH EVERY 6 HOURS AS NEEDED FOR DIARRHEA, Disp: , Rfl:  .  DULoxetine (CYMBALTA) 60 MG capsule, Take 60 mg by mouth daily. , Disp: , Rfl:  .  ferrous sulfate 325 (65 FE) MG  tablet, Take by mouth., Disp: , Rfl:  .  fluticasone (FLONASE) 50 MCG/ACT nasal spray, USE 2 SPRAY(S) IN EACH NOSTRIL DAILY, Disp: , Rfl: 11 .  furosemide (LASIX) 40 MG tablet, Take 1 tablet (40 mg total) by mouth daily., Disp: , Rfl:  .  glimepiride (AMARYL) 4 MG tablet, Take 4 mg by mouth 2 (two) times daily. , Disp: , Rfl:  .  Lancets (ONETOUCH DELICA PLUS [id number]) MISC, CHECK GLUCOSE 3 TIMES A DAY, Disp: , Rfl: 2 .  LYRICA 200 MG capsule, Take 200 mg by mouth 4 (four) times daily. , Disp: , Rfl:  .  meloxicam (MOBIC) 15 MG tablet, TAKE 1 TABLET BY MOUTH ONCE DAILY FOR 30 DAYS, Disp: , Rfl:  .  metFORMIN (GLUCOPHAGE) 500 MG tablet, Take 500 mg by mouth 2 (two) times daily. , Disp: , Rfl:  .  metoprolol succinate (TOPROL-XL) 50 MG 24 hr tablet, Take 50 mg by mouth daily. , Disp: , Rfl:  .  Multiple Vitamins-Minerals (BARIATRIC FUSION PO), Take 1 tablet by mouth daily., Disp: , Rfl:  .  NARCAN 4 MG/0.1ML LIQD nasal spray kit, PLEASE SEE ATTACHED FOR DETAILED DIRECTIONS, Disp: , Rfl:  .  nortriptyline (PAMELOR) 50  MG capsule, TAKE 1 CAPSULE BY MOUTH DAILY AT BEDTIME, Disp: , Rfl:  .  NOVOLOG FLEXPEN 100 UNIT/ML FlexPen, Inject 2-15 Units into the skin 3 (three) times daily with meals. , Disp: , Rfl:  .  Olopatadine HCl 0.2 % SOLN, INSTILL 1 DROP IN [facility] EYE DAILY AS NEEDED, Disp: , Rfl: 2 .  omeprazole (PRILOSEC) 20 MG capsule, Take 1 capsule (20 mg total) by mouth daily., Disp: 30 capsule, Rfl: 0 .  ondansetron (ZOFRAN-ODT) 4 MG disintegrating tablet, DISSOLVE 1 TABLET IN MOUTH EVERY 8 HOURS AS NEEDED FOR UP TO 7 DAYS FOR NAUSEA OR VOMITING, Disp: , Rfl:  .  Oxycodone HCl 10 MG TABS, Take 10 mg by mouth every 8 (eight) hours as needed., Disp: , Rfl:  .  pantoprazole (PROTONIX) 40 MG tablet, , Disp: , Rfl:  .  simvastatin (ZOCOR) 40 MG tablet, Take 20 mg by mouth daily., Disp: , Rfl:  .  spironolactone (ALDACTONE) 25 MG tablet, Take by mouth., Disp: , Rfl:  .  tiZANidine (ZANAFLEX) 2 MG tablet,  TAKE 1 TABLET BY MOUTH AT BEDTIME AS NEEDED FOR PAINS, Disp: , Rfl: 2 .  triamcinolone cream (KENALOG) 0.5 %, APPLY CREAM TOPICALLY TO LEGS TWICE A DAY AS NEEDED, Disp: , Rfl: 2 .  venlafaxine XR (EFFEXOR-XR) 150 MG 24 hr capsule, Take 150 mg by mouth daily with breakfast., Disp: , Rfl:  .  XULTOPHY 100-3.6 UNIT-MG/ML SOPN, INJECT 20 UNITS SUBCUTANEOUSLY ONCE DAILY IN THE MORNING, Disp: , Rfl: 5 .  zolpidem (AMBIEN) 10 MG tablet, Take 10 mg by mouth at bedtime as needed., Disp: , Rfl: 5  Social History   Tobacco Use  Smoking Status Former Smoker  . Packs/day: 0.25  . Years: 1.00  . Pack years: 0.25  . Types: Cigarettes  . Quit date: 03/06/2010  . Years since quitting: 8.8  Smokeless Tobacco Never Used    Allergies  Allergen Reactions  . Ampicillin Anaphylaxis, Hives and Other (See Comments)    Has patient had a PCN reaction causing immediate rash, facial/tongue/throat swelling, SOB or lightheadedness with hypotension: Yes Has patient had a PCN reaction causing severe rash involving mucus membranes or skin necrosis: Yes Has patient had a PCN reaction that required hospitalization Yes Has patient had a PCN reaction occurring within the last 10 years: No If all of the above answers are "NO", then may proceed with Cephalosporin use.   Marland Kitchen Penicillins Anaphylaxis, Hives and Nausea And Vomiting    Has patient had a PCN reaction causing immediate rash, facial/tongue/throat swelling, SOB or lightheadedness with hypotension: Yes Has patient had a PCN reaction causing severe rash involving mucus membranes or skin necrosis: Yes Has patient had a PCN reaction that required hospitalization Yes Has patient had a PCN reaction occurring within the last 10 years: No If all of the above answers are "NO", then may proceed with Cephalosporin use. Has patient had a PCN reaction causing immediate rash, facial/tongue/throat swelling, SOB or lightheadedness with hypotension: Yes Has patient had a PCN  reaction causing severe rash involving mucus membranes or skin necrosis: Yes Has patient had a PCN reaction that required hospitalization Yes Has patient had a PCN reaction occurring within the last 10 years: No If all of the above answers are "NO", then may proceed with Cephalosporin use.  Marland Kitchen Doxycycline Other (See Comments)    unknown unknown unknown  . Other Swelling, Rash and Other (See Comments)    Blistex Blistex Blistex   Objective:  There  were no vitals filed for this visit. There is no height or weight on file to calculate BMI. Constitutional Well developed. Well nourished.  Vascular Dorsalis pedis pulses palpable bilaterally. Posterior tibial pulses palpable bilaterally. Capillary refill normal to all digits.  No cyanosis or clubbing noted. Pedal hair growth normal.  Neurologic Normal speech. Oriented to person, place, and time. Epicritic sensation to light touch grossly present bilaterally.  Dermatologic  plantar midfoot puncture wound granular wound base.  No malodor or other clinical signs of infection noted no frank pus.  Mild maceration surrounding periwound.  Orthopedic: Normal joint ROM without pain or crepitus bilaterally. No visible deformities. No bony tenderness.   Radiographs: None Assessment:   1. Puncture wound   2. Type 2 diabetes mellitus with diabetic neuropathy, with long-term current use of insulin ([place])   3. Diabetes mellitus type 2 in obese [facility])    Plan:  Patient was evaluated and treated and all questions answered.  Left midfoot plantar puncture wound -It appears that the wound has been about the same size since I last saw her 2 weeks ago.  Recent mild maceration in the periwound however very granular wound base.  I spoke to her extensively about possible surgical intervention for closure of this wound.  This would entail wound closure via flap.  I will give her another 2 weeks for her body to attempt the closure of the puncture wound.  If  unable to do so I will talk to her about a possible surgical intervention for closure. -Continue offloading with surgical shoe.   -I also applied Prisma to the wound bed.  I instructed the husband on how to apply Prisma.  He will continue to do so every other day until they come see me in 2 weeks.  No follow-ups on file.

## 2019-01-23 ENCOUNTER — Encounter: Payer: Self-pay | Admitting: Podiatry

## 2019-01-23 ENCOUNTER — Other Ambulatory Visit: Payer: Self-pay

## 2019-01-23 ENCOUNTER — Ambulatory Visit (INDEPENDENT_AMBULATORY_CARE_PROVIDER_SITE_OTHER): Payer: Medicare Other | Admitting: Podiatry

## 2019-01-23 DIAGNOSIS — M2012 Hallux valgus (acquired), left foot: Secondary | ICD-10-CM | POA: Diagnosis not present

## 2019-01-23 DIAGNOSIS — Z794 Long term (current) use of insulin: Secondary | ICD-10-CM

## 2019-01-23 DIAGNOSIS — E1140 Type 2 diabetes mellitus with diabetic neuropathy, unspecified: Secondary | ICD-10-CM

## 2019-01-23 DIAGNOSIS — T148XXA Other injury of unspecified body region, initial encounter: Secondary | ICD-10-CM | POA: Diagnosis not present

## 2019-01-23 DIAGNOSIS — M2031 Hallux varus (acquired), right foot: Secondary | ICD-10-CM

## 2019-01-23 DIAGNOSIS — M2032 Hallux varus (acquired), left foot: Secondary | ICD-10-CM | POA: Diagnosis not present

## 2019-01-23 NOTE — Progress Notes (Addendum)
Subjective:  Patient ID: Lacey Meyer, female    DOB: 07-19-1960,  MRN: [id number]  Chief Complaint  Patient presents with  . Foot Pain    pt is here for a f/u on puncture wound     58 y.o. female presents with the above complaint.  Patient presents with follow-up on the wound on the plantar aspect of left midfoot.  Patient states that they are applying the Prisma directly on the wound.  Patient's husband states that the wound is looking really good.  She denies any acute complaints.  They have been doing dressing changes daily.  She has been ambulating in a surgical shoe.   Review of Systems: Negative except as noted in the HPI. Denies N/V/F/Ch.  Past Medical History:  Diagnosis Date  . Anemia   . Arthritis   . Diabetes mellitus without complication ([place])   . GERD (gastroesophageal reflux disease)   . Gout   . History of blood transfusion   . HTN (hypertension)   . Impaired vision     Current Outpatient Medications:  .  ACCU-CHEK GUIDE test strip, USE TO TEST THREE TIMES DAILY (E11.65), Disp: , Rfl:  .  allopurinol (ZYLOPRIM) 300 MG tablet, Take by mouth., Disp: , Rfl:  .  ALPRAZolam (XANAX) 0.25 MG tablet, Take 0.25 mg by mouth daily as needed for anxiety., Disp: , Rfl:  .  B-D UF III MINI PEN NEEDLES 31G X 5 MM MISC, , Disp: , Rfl:  .  benazepril (LOTENSIN) 10 MG tablet, Take 10 mg by mouth daily., Disp: , Rfl:  .  cephALEXin (KEFLEX) 500 MG capsule, , Disp: , Rfl:  .  colchicine 0.6 MG tablet, Take by mouth., Disp: , Rfl:  .  diclofenac (VOLTAREN) 75 MG EC tablet, Take by mouth., Disp: , Rfl:  .  diclofenac sodium (VOLTAREN) 1 % GEL, APPLY 4 GRAMS 4 TIMES A DAY AS NEEDED FOR PAINS, Disp: , Rfl: 5 .  diclofenac Sodium (VOLTAREN) 1 % GEL, , Disp: , Rfl:  .  diphenoxylate-atropine (LOMOTIL) 2.5-0.025 MG tablet, TAKE 1 TABLET BY MOUTH EVERY 6 HOURS AS NEEDED FOR DIARRHEA, Disp: , Rfl:  .  DULoxetine (CYMBALTA) 60 MG capsule, Take 60 mg by mouth daily. , Disp: , Rfl:  .   ferrous sulfate 325 (65 FE) MG tablet, Take by mouth., Disp: , Rfl:  .  fluticasone (FLONASE) 50 MCG/ACT nasal spray, USE 2 SPRAY(S) IN EACH NOSTRIL DAILY, Disp: , Rfl: 11 .  furosemide (LASIX) 40 MG tablet, Take 1 tablet (40 mg total) by mouth daily., Disp: , Rfl:  .  glimepiride (AMARYL) 4 MG tablet, Take 4 mg by mouth 2 (two) times daily. , Disp: , Rfl:  .  Lancets (ONETOUCH DELICA PLUS [id number]) MISC, CHECK GLUCOSE 3 TIMES A DAY, Disp: , Rfl: 2 .  LYRICA 200 MG capsule, Take 200 mg by mouth 4 (four) times daily. , Disp: , Rfl:  .  meloxicam (MOBIC) 15 MG tablet, TAKE 1 TABLET BY MOUTH ONCE DAILY FOR 30 DAYS, Disp: , Rfl:  .  metFORMIN (GLUCOPHAGE) 500 MG tablet, Take 500 mg by mouth 2 (two) times daily. , Disp: , Rfl:  .  metoprolol succinate (TOPROL-XL) 50 MG 24 hr tablet, Take 50 mg by mouth daily. , Disp: , Rfl:  .  Multiple Vitamins-Minerals (BARIATRIC FUSION PO), Take 1 tablet by mouth daily., Disp: , Rfl:  .  NARCAN 4 MG/0.1ML LIQD nasal spray kit, PLEASE SEE ATTACHED FOR DETAILED DIRECTIONS, Disp: ,  Rfl:  .  nortriptyline (PAMELOR) 50 MG capsule, TAKE 1 CAPSULE BY MOUTH DAILY AT BEDTIME, Disp: , Rfl:  .  NOVOLOG FLEXPEN 100 UNIT/ML FlexPen, Inject 2-15 Units into the skin 3 (three) times daily with meals. , Disp: , Rfl:  .  Olopatadine HCl 0.2 % SOLN, INSTILL 1 DROP IN EACH EYE DAILY AS NEEDED, Disp: , Rfl: 2 .  ondansetron (ZOFRAN-ODT) 4 MG disintegrating tablet, DISSOLVE 1 TABLET IN MOUTH EVERY 8 HOURS AS NEEDED FOR UP TO 7 DAYS FOR NAUSEA OR VOMITING, Disp: , Rfl:  .  Oxycodone HCl 10 MG TABS, Take 10 mg by mouth every 8 (eight) hours as needed., Disp: , Rfl:  .  pantoprazole (PROTONIX) 40 MG tablet, , Disp: , Rfl:  .  simvastatin (ZOCOR) 40 MG tablet, Take 20 mg by mouth daily., Disp: , Rfl:  .  spironolactone (ALDACTONE) 25 MG tablet, Take by mouth., Disp: , Rfl:  .  tiZANidine (ZANAFLEX) 2 MG tablet, TAKE 1 TABLET BY MOUTH AT BEDTIME AS NEEDED FOR PAINS, Disp: , Rfl: 2 .   triamcinolone cream (KENALOG) 0.5 %, APPLY CREAM TOPICALLY TO LEGS TWICE A DAY AS NEEDED, Disp: , Rfl: 2 .  venlafaxine XR (EFFEXOR-XR) 150 MG 24 hr capsule, Take 150 mg by mouth daily with breakfast., Disp: , Rfl:  .  XULTOPHY 100-3.6 UNIT-MG/ML SOPN, INJECT 20 UNITS SUBCUTANEOUSLY ONCE DAILY IN THE MORNING, Disp: , Rfl: 5 .  zolpidem (AMBIEN) 10 MG tablet, Take 10 mg by mouth at bedtime as needed., Disp: , Rfl: 5 .  omeprazole (PRILOSEC) 20 MG capsule, Take 1 capsule (20 mg total) by mouth daily., Disp: 30 capsule, Rfl: 0  Social History   Tobacco Use  Smoking Status Former Smoker  . Packs/day: 0.25  . Years: 1.00  . Pack years: 0.25  . Types: Cigarettes  . Quit date: 03/06/2010  . Years since quitting: 8.9  Smokeless Tobacco Never Used    Allergies  Allergen Reactions  . Ampicillin Anaphylaxis, Hives and Other (See Comments)    Has patient had a PCN reaction causing immediate rash, facial/tongue/throat swelling, SOB or lightheadedness with hypotension: Yes Has patient had a PCN reaction causing severe rash involving mucus membranes or skin necrosis: Yes Has patient had a PCN reaction that required hospitalization Yes Has patient had a PCN reaction occurring within the last 10 years: No If all of the above answers are "NO", then may proceed with Cephalosporin use.   Marland Kitchen Penicillins Anaphylaxis, Hives and Nausea And Vomiting    Has patient had a PCN reaction causing immediate rash, facial/tongue/throat swelling, SOB or lightheadedness with hypotension: Yes Has patient had a PCN reaction causing severe rash involving mucus membranes or skin necrosis: Yes Has patient had a PCN reaction that required hospitalization Yes Has patient had a PCN reaction occurring within the last 10 years: No If all of the above answers are "NO", then may proceed with Cephalosporin use. Has patient had a PCN reaction causing immediate rash, facial/tongue/throat swelling, SOB or lightheadedness with  hypotension: Yes Has patient had a PCN reaction causing severe rash involving mucus membranes or skin necrosis: Yes Has patient had a PCN reaction that required hospitalization Yes Has patient had a PCN reaction occurring within the last 10 years: No If all of the above answers are "NO", then may proceed with Cephalosporin use.  Marland Kitchen Doxycycline Other (See Comments)    unknown unknown unknown  . Other Swelling, Rash and Other (See Comments)    Blistex  Blistex Blistex   Objective:  There were no vitals filed for this visit. There is no height or weight on file to calculate BMI. Constitutional Well developed. Well nourished.  Vascular Dorsalis pedis pulses palpable bilaterally. Posterior tibial pulses palpable bilaterally. Capillary refill normal to all digits.  No cyanosis or clubbing noted. Pedal hair growth normal.  Neurologic Normal speech. Oriented to person, place, and time. Epicritic sensation to light touch grossly present bilaterally.  Dermatologic  hyperkeratotic lesion noted on the left plantar midfoot.  This was a site of the puncture wound.  No cellulitis or any other skin infection noted.  Orthopedic: Normal joint ROM without pain or crepitus bilaterally. No visible deformities. No bony tenderness.   Radiographs: None Assessment:   1. Puncture wound   2. Type 2 diabetes mellitus with diabetic neuropathy, with long-term current use of insulin ([facility])   3. Hallux malleus of left foot   4. Hav (hallux abducto valgus), left   5. Hallux malleus of right foot    Plan:  Patient was evaluated and treated and all questions answered.  Left plantar midfoot puncture wound -Resolved.  It appears that patient has completely reepithelialized the left plantar midfoot achieving closure.  At this point I will hold off on any surgical procedures.  -Patient was seen by back to have diabetic insoles casted.  I believe this will help with even distribution of pressure to bilateral  foot and prevent ulcers and callus formation.  No follow-ups on file.

## 2019-03-04 ENCOUNTER — Ambulatory Visit: Payer: Medicare Other | Admitting: Podiatry

## 2019-04-08 ENCOUNTER — Ambulatory Visit (INDEPENDENT_AMBULATORY_CARE_PROVIDER_SITE_OTHER): Payer: Medicare Other | Admitting: Podiatry

## 2019-04-08 ENCOUNTER — Encounter: Payer: Self-pay | Admitting: Podiatry

## 2019-04-08 ENCOUNTER — Other Ambulatory Visit: Payer: Self-pay

## 2019-04-08 DIAGNOSIS — E1140 Type 2 diabetes mellitus with diabetic neuropathy, unspecified: Secondary | ICD-10-CM | POA: Diagnosis not present

## 2019-04-08 DIAGNOSIS — B351 Tinea unguium: Secondary | ICD-10-CM

## 2019-04-08 DIAGNOSIS — Z794 Long term (current) use of insulin: Secondary | ICD-10-CM

## 2019-04-08 DIAGNOSIS — M79675 Pain in left toe(s): Secondary | ICD-10-CM | POA: Diagnosis not present

## 2019-04-08 DIAGNOSIS — M79674 Pain in right toe(s): Secondary | ICD-10-CM

## 2019-04-08 NOTE — Progress Notes (Signed)
Complaint:  Visit Type: Patient returns to my office for continued preventative foot care services. Complaint: Patient states" my nails have grown long and thick and become painful to walk and wear shoes" Patient has been diagnosed with DM with no foot complications. The patient presents for preventative foot care services. No changes to ROS.  Patient has had surgery with Dr.  Al Corpus.  Podiatric Exam: Vascular: dorsalis pedis and posterior tibial pulses are palpable bilateral. Capillary return is immediate. Temperature gradient is WNL. Skin turgor WNL  Sensorium: Diminished  Semmes Weinstein monofilament test. Normal tactile sensation bilaterally. Nail Exam: Pt has thick disfigured discolored nails with subungual debris noted bilateral entire nail hallux through fifth toenails Ulcer Exam: There is no evidence of ulcer or pre-ulcerative changes or infection. Orthopedic Exam: Muscle tone and strength are WNL. No limitations in general ROM. No crepitus or effusions noted. Foot type and digits show no abnormalities. Bony prominences are unremarkable. Skin: No Porokeratosis. No infection or ulcers  Diagnosis:  Onychomycosis, , Pain in right toe, pain in left toes  Treatment & Plan Procedures and Treatment: Consent by patient was obtained for treatment procedures.   Debridement of mycotic and hypertrophic toenails, 1 through 5 bilateral and clearing of subungual debris. No ulceration, no infection noted.  Return Visit-Office Procedure: Patient instructed to return to the office for a follow up visit 3 months for continued evaluation and treatment.    Helane Gunther DPM

## 2019-04-29 ENCOUNTER — Ambulatory Visit: Payer: Medicare Other | Admitting: Orthotics

## 2019-04-29 ENCOUNTER — Other Ambulatory Visit: Payer: Self-pay

## 2019-04-29 DIAGNOSIS — M216X1 Other acquired deformities of right foot: Secondary | ICD-10-CM | POA: Diagnosis not present

## 2019-04-29 DIAGNOSIS — E1140 Type 2 diabetes mellitus with diabetic neuropathy, unspecified: Secondary | ICD-10-CM

## 2019-04-29 DIAGNOSIS — M216X2 Other acquired deformities of left foot: Secondary | ICD-10-CM

## 2019-06-16 ENCOUNTER — Other Ambulatory Visit: Payer: Self-pay

## 2019-06-16 ENCOUNTER — Ambulatory Visit (INDEPENDENT_AMBULATORY_CARE_PROVIDER_SITE_OTHER): Payer: Medicare Other | Admitting: Ophthalmology

## 2019-06-16 ENCOUNTER — Encounter (INDEPENDENT_AMBULATORY_CARE_PROVIDER_SITE_OTHER): Payer: Self-pay | Admitting: Ophthalmology

## 2019-06-16 DIAGNOSIS — E113493 Type 2 diabetes mellitus with severe nonproliferative diabetic retinopathy without macular edema, bilateral: Secondary | ICD-10-CM

## 2019-06-16 DIAGNOSIS — H43821 Vitreomacular adhesion, right eye: Secondary | ICD-10-CM

## 2019-06-16 DIAGNOSIS — H43822 Vitreomacular adhesion, left eye: Secondary | ICD-10-CM

## 2019-06-16 DIAGNOSIS — H34831 Tributary (branch) retinal vein occlusion, right eye, with macular edema: Secondary | ICD-10-CM

## 2019-06-16 HISTORY — DX: Vitreomacular adhesion, left eye: H43.822

## 2019-06-16 MED ORDER — BEVACIZUMAB CHEMO INJECTION 1.25MG/0.05ML SYRINGE FOR KALEIDOSCOPE
1.2500 mg | INTRAVITREAL | Status: AC | PRN
  Administered 2019-06-16: 11:00:00 1.25 mg via INTRAVITREAL

## 2019-06-16 NOTE — Patient Instructions (Signed)
The nature of severe nonproliferative diabetic retinopathy discussed with the patient as well as the need for more frequent follow up and likely progression to proliferative disease in the near future. The options of continued observation versus panretinal photocoagulation at this time were reviewed as well as the risks, benefits, and alternatives. More recent option includes the use of ocular injectable medications to slow progression of retinal disease. Tight control of glucose, blood pressure, and serum lipid levels were recommended under the direction of general physician or endocrinologist, as well as avoidance of smoking and maintenance of normal body weight. The 2-year risk of progression to proliferative diabetic retinopathy is 60%. 

## 2019-07-08 ENCOUNTER — Ambulatory Visit: Payer: Medicare Other | Admitting: Podiatry

## 2019-07-22 ENCOUNTER — Encounter (HOSPITAL_COMMUNITY): Payer: Self-pay

## 2019-07-28 ENCOUNTER — Ambulatory Visit (INDEPENDENT_AMBULATORY_CARE_PROVIDER_SITE_OTHER): Payer: Medicare Other | Admitting: Ophthalmology

## 2019-07-28 ENCOUNTER — Encounter (INDEPENDENT_AMBULATORY_CARE_PROVIDER_SITE_OTHER): Payer: Self-pay | Admitting: Ophthalmology

## 2019-07-28 ENCOUNTER — Other Ambulatory Visit: Payer: Self-pay

## 2019-07-28 DIAGNOSIS — H26491 Other secondary cataract, right eye: Secondary | ICD-10-CM

## 2019-07-28 DIAGNOSIS — H34831 Tributary (branch) retinal vein occlusion, right eye, with macular edema: Secondary | ICD-10-CM | POA: Diagnosis not present

## 2019-07-28 MED ORDER — BEVACIZUMAB CHEMO INJECTION 1.25MG/0.05ML SYRINGE FOR KALEIDOSCOPE
1.2500 mg | INTRAVITREAL | Status: AC | PRN
  Administered 2019-07-28: 1.25 mg via INTRAVITREAL

## 2019-07-28 NOTE — Assessment & Plan Note (Signed)
Posterior capsule opacification centrally in the right eye.  Patient is asked to follow-up with Dr. Elmer Picker for consideration of YAG capsulotomy to maximize visual potential in the right eye

## 2019-07-28 NOTE — Progress Notes (Signed)
07/28/2019     CHIEF COMPLAINT Patient presents for Retina Follow Up   HISTORY OF PRESENT ILLNESS: Lacey Meyer is a 59 y.o. female who presents to the clinic today for:   HPI    Retina Follow Up    Patient presents with  CRVO/BRVO.  In right eye.  This started 6 weeks ago.  Severity is mild.  Duration of 6 weeks.  Since onset it is stable.          Comments    6 Week BRVO F/U OD, poss Avastin OD  Pt denies noticeable changes to [place] OU since last visit. Pt denies ocular pain, flashes of light, or floaters OU.         Last edited by Rockie Neighbours, [place] on 07/28/2019  9:13 AM. (History)      Referring physician: Nolene Ebbs, MD [place],  [place] [id number]  HISTORICAL INFORMATION:   Selected notes from the MEDICAL RECORD NUMBER    Lab Results  Component Value Date   HGBA1C 10.1 (H) 11/07/2018     CURRENT MEDICATIONS: Current Outpatient Medications (Ophthalmic Drugs)  Medication Sig  . Olopatadine HCl 0.2 % SOLN INSTILL 1 DROP IN [facility] EYE DAILY AS NEEDED   No current facility-administered medications for this visit. (Ophthalmic Drugs)   Current Outpatient Medications (Other)  Medication Sig  . ACCU-CHEK GUIDE test strip USE TO TEST THREE TIMES DAILY (E11.65)  . allopurinol (ZYLOPRIM) 300 MG tablet Take by mouth.  . ALPRAZolam (XANAX) 0.25 MG tablet Take 0.25 mg by mouth daily as needed for anxiety.  . B-D UF III MINI PEN NEEDLES 31G X 5 MM MISC   . benazepril (LOTENSIN) 10 MG tablet Take 10 mg by mouth daily.  . cephALEXin (KEFLEX) 500 MG capsule   . colchicine 0.6 MG tablet Take by mouth.  . diclofenac (VOLTAREN) 75 MG EC tablet Take by mouth.  . diclofenac Sodium (VOLTAREN) 1 % GEL   . diphenoxylate-atropine (LOMOTIL) 2.5-0.025 MG tablet TAKE 1 TABLET BY MOUTH EVERY 6 HOURS AS NEEDED FOR DIARRHEA  . DULoxetine (CYMBALTA) 60 MG capsule Take 60 mg by mouth daily.   . ferrous sulfate 325 (65 FE) MG tablet Take by mouth.  . fluticasone  (FLONASE) 50 MCG/ACT nasal spray USE 2 SPRAY(S) IN EACH NOSTRIL DAILY  . furosemide (LASIX) 40 MG tablet Take 1 tablet (40 mg total) by mouth daily.  Marland Kitchen glimepiride (AMARYL) 4 MG tablet Take 4 mg by mouth 2 (two) times daily.   . Lancets (ONETOUCH DELICA PLUS CLEXNT70Y) MISC CHECK GLUCOSE 3 TIMES A DAY  . LYRICA 150 MG capsule   . LYRICA 200 MG capsule Take 200 mg by mouth 4 (four) times daily.   . meloxicam (MOBIC) 15 MG tablet TAKE 1 TABLET BY MOUTH ONCE DAILY FOR 30 DAYS  . metFORMIN (GLUCOPHAGE) 500 MG tablet Take 500 mg by mouth 2 (two) times daily.   . methylPREDNISolone (MEDROL DOSEPAK) 4 MG TBPK tablet   . metoprolol succinate (TOPROL-XL) 50 MG 24 hr tablet Take 50 mg by mouth daily.   . Multiple Vitamins-Minerals (BARIATRIC FUSION PO) Take 1 tablet by mouth daily.  . naloxone (NARCAN) 4 MG/0.1ML LIQD nasal spray kit PLEASE SEE ATTACHED FOR DETAILED DIRECTIONS  . nortriptyline (PAMELOR) 50 MG capsule TAKE 1 CAPSULE BY MOUTH DAILY AT BEDTIME  . NOVOLOG FLEXPEN 100 UNIT/ML FlexPen Inject 2-15 Units into the skin 3 (three) times daily with meals.   Marland Kitchen omeprazole (PRILOSEC) 20 MG  capsule Take 1 capsule (20 mg total) by mouth daily.  . ondansetron (ZOFRAN-ODT) 4 MG disintegrating tablet DISSOLVE 1 TABLET IN MOUTH EVERY 8 HOURS AS NEEDED FOR UP TO 7 DAYS FOR NAUSEA OR VOMITING  . Oxycodone HCl 10 MG TABS Take 10 mg by mouth every 8 (eight) hours as needed.  . pantoprazole (PROTONIX) 40 MG tablet   . simvastatin (ZOCOR) 40 MG tablet   . spironolactone (ALDACTONE) 25 MG tablet Take by mouth.  Marland Kitchen tiZANidine (ZANAFLEX) 2 MG tablet TAKE 1 TABLET BY MOUTH AT BEDTIME AS NEEDED FOR PAINS  . triamcinolone cream (KENALOG) 0.5 % APPLY CREAM TOPICALLY TO LEGS TWICE A DAY AS NEEDED  . venlafaxine XR (EFFEXOR-XR) 150 MG 24 hr capsule Take 150 mg by mouth daily with breakfast.  . VENTOLIN HFA 108 (90 Base) MCG/ACT inhaler   . Vitamin D, Ergocalciferol, (DRISDOL) 1.25 MG (50000 UNIT) CAPS capsule   .  XULTOPHY 100-3.6 UNIT-MG/ML SOPN INJECT 20 UNITS SUBCUTANEOUSLY ONCE DAILY IN THE MORNING  . zolpidem (AMBIEN) 10 MG tablet Take 10 mg by mouth at bedtime as needed.   No current facility-administered medications for this visit. (Other)      REVIEW OF SYSTEMS:    ALLERGIES Allergies  Allergen Reactions  . Ampicillin Anaphylaxis, Hives and Other (See Comments)    Has patient had a PCN reaction causing immediate rash, facial/tongue/throat swelling, SOB or lightheadedness with hypotension: Yes Has patient had a PCN reaction causing severe rash involving mucus membranes or skin necrosis: Yes Has patient had a PCN reaction that required hospitalization Yes Has patient had a PCN reaction occurring within the last 10 years: No If all of the above answers are "NO", then may proceed with Cephalosporin use.   Marland Kitchen Penicillins Anaphylaxis, Hives and Nausea And Vomiting    Has patient had a PCN reaction causing immediate rash, facial/tongue/throat swelling, SOB or lightheadedness with hypotension: Yes Has patient had a PCN reaction causing severe rash involving mucus membranes or skin necrosis: Yes Has patient had a PCN reaction that required hospitalization Yes Has patient had a PCN reaction occurring within the last 10 years: No If all of the above answers are "NO", then may proceed with Cephalosporin use. Has patient had a PCN reaction causing immediate rash, facial/tongue/throat swelling, SOB or lightheadedness with hypotension: Yes Has patient had a PCN reaction causing severe rash involving mucus membranes or skin necrosis: Yes Has patient had a PCN reaction that required hospitalization Yes Has patient had a PCN reaction occurring within the last 10 years: No If all of the above answers are "NO", then may proceed with Cephalosporin use.  Marland Kitchen Doxycycline Other (See Comments)    unknown unknown unknown  . Other Swelling, Rash and Other (See Comments)    Blistex Blistex Blistex    PAST  MEDICAL HISTORY Past Medical History:  Diagnosis Date  . Anemia   . Arthritis   . Diabetes mellitus without complication ([place])   . GERD (gastroesophageal reflux disease)   . Gout   . History of blood transfusion   . HTN (hypertension)   . Impaired vision    Past Surgical History:  Procedure Laterality Date  . BTL    . EYE SURGERY     cataract surgery / right eye   . FOOT SURGERY     bilateral   . LAPAROSCOPIC GASTRIC SLEEVE RESECTION N/A 01/03/2016   Procedure: LAPAROSCOPIC GASTRIC SLEEVE RESECTION WITH UPPER ENDO;  Surgeon: Alphonsa Overall, MD;  Location: [facility] ORS;  Service: General;  Laterality: N/A;  . SHOULDER ARTHROSCOPY W/ ROTATOR CUFF REPAIR     left   . TUBAL LIGATION      FAMILY HISTORY Family History  Problem Relation Age of Onset  . Hypertension Mother   . Hypertension Father     SOCIAL HISTORY Social History   Tobacco Use  . Smoking status: Former Smoker    Packs/day: 0.25    Years: 1.00    Pack years: 0.25    Types: Cigarettes    Quit date: 03/06/2010    Years since quitting: 9.4  . Smokeless tobacco: Never Used  Substance Use Topics  . Alcohol use: No  . Drug use: No         OPHTHALMIC EXAM:  Base Eye Exam    Visual Acuity (ETDRS)      Right Left   Dist [place] 20/200 +1 20/200 -1   Dist ph [place] NI 20/100 -3       Tonometry (Tonopen, 9:17 AM)      Right Left   Pressure 22 22       Pupils      Pupils Dark Light Shape React APD   Right PERRL 5 4 Round Brisk None   Left PERRL 5 4 Round Brisk None       Visual Fields (Counting fingers)      Left Right    Full Full       Extraocular Movement      Right Left    Full Full       Neuro/Psych    Oriented x3: Yes   Mood/Affect: Normal       Dilation    Right eye: 1.0% Mydriacyl, 2.5% Phenylephrine @ 9:17 AM        Slit Lamp and Fundus Exam    External Exam      Right Left   External Normal Normal       Slit Lamp Exam      Right Left   Lids/Lashes Normal Normal    Conjunctiva/Sclera White and quiet White and quiet   Cornea Clear Clear   Anterior Chamber Deep and quiet Deep and quiet   Iris Round and reactive Round and reactive   Lens Posterior chamber intraocular lens, 2+ Posterior capsular opacification centrally  Posterior chamber intraocular lens, Centered posterior chamber intraocular lens   Anterior Vitreous Normal Normal       Fundus Exam      Right Left   Posterior Vitreous  Normal   Disc Normal    C/D Ratio 0.2 0.4   Macula Retinal pigment epithelial mottling Retinal pigment epithelial atrophy, Macular atrophy   Vessels Old BRVO a collateralization along the superotemporal margin of the nerve Normal   Periphery Normal Normal          IMAGING AND PROCEDURES  Imaging and Procedures for 07/28/19  OCT, Retina - OU - Both Eyes       Right Eye Quality was good. Scan locations included subfoveal. Central Foveal Thickness: 179. Progression has been stable. Findings include abnormal foveal contour, outer retinal atrophy, inner retinal atrophy.   Left Eye Quality was good. Scan locations included subfoveal. Central Foveal Thickness: 178. Progression has been stable. Findings include abnormal foveal contour, central retinal atrophy, outer retinal atrophy, inner retinal atrophy.   Notes Prior CME along the superotemporal margin the macula is continuing to remain stable.  On intravitreal Avastin at 6 weeks.,  Will repeat injection OD today and exam in  6 weeks.       Intravitreal Injection, Pharmacologic Agent - OD - Right Eye       Time Out 07/28/2019. 10:01 AM. Confirmed correct patient, procedure, site, and patient consented.   Anesthesia Topical anesthesia was used. Anesthetic medications included [place] 3.5%.   Procedure Preparation included Tobramycin 0.3%, 10% betadine to eyelids. A 30 gauge needle was used.   Injection:  1.25 mg Bevacizumab (AVASTIN) SOLN   NDC: [address]-0, Lot: [id number]   Route: Intravitreal, Site: Right  Eye, Waste: 0 mg  Post-op Post injection exam found visual acuity of at least counting fingers. The patient tolerated the procedure well. There were no complications. The patient received written and verbal post procedure care education. Post injection medications were not given.                 ASSESSMENT/PLAN:  Branch retinal vein occlusion with macular edema of right eye The nature of branch retinal vein occlusion with macular edema was discussed.  The patient was given access to printed information.  The treatment options including continued observation looking for spontaneous resolution versus grid laser versus intravitreal Kenalog injection were discussed.  PRIMARY THERAPY CONSISTS of Anti-VEGF Therapies, AVASTIN, LUCENTIS AND EYLEA.  Their usage was discussed to assist in halting the progression of Macular Edema, in order to preserve, protect or improve acuity.  Additionally, at times, limited focal laser therapy is used in the management.  The risks and benefits of all these options were discussed with the patient.  The patient's questions were answered.  OD, less active and stable.  At 6-week exam interval, repeat intravitreal Avastin today.  Examination in 6 weeks.  Vision is still limited by macular atrophy of the outer retina  Right posterior capsular opacification Posterior capsule opacification centrally in the right eye.  Patient is asked to follow-up with Dr. Herbert Deaner for consideration of YAG capsulotomy to maximize visual potential in the right eye      ICD-10-CM   1. Branch retinal vein occlusion with macular edema of right eye  H34.8310 OCT, Retina - OU - Both Eyes    Intravitreal Injection, Pharmacologic Agent - OD - Right Eye    Bevacizumab (AVASTIN) SOLN 1.25 mg  2. Right posterior capsular opacification  H26.491     1.superotemporal BRVO with prior CME along the superotemporal margin the macula is continuing to remain stable.  On intravitreal Avastin at 6 weeks.,   Will repeat injection OD today and exam in 6 weeks.  2.Posterior capsule opacification centrally in the right eye.  Patient is asked to follow-up with Dr. Herbert Deaner for consideration of YAG capsulotomy to maximize visual potential in the right eye  3.  Ophthalmic Meds Ordered this visit:  Meds ordered this encounter  Medications  . Bevacizumab (AVASTIN) SOLN 1.25 mg       No follow-ups on file.  There are no Patient Instructions on file for this visit.   Explained the diagnoses, plan, and follow up with the patient and they expressed understanding.  Patient expressed understanding of the importance of proper follow up care.   Clent Demark  M.D. Diseases & Surgery of the Retina and Vitreous Retina & Diabetic [place] 07/28/19     Abbreviations: M myopia (nearsighted); A astigmatism; H hyperopia (farsighted); P presbyopia; Mrx spectacle prescription;  CTL contact lenses; OD right eye; OS left eye; OU both eyes  XT exotropia; ET esotropia; PEK punctate epithelial keratitis; PEE punctate epithelial erosions; DES dry eye syndrome; MGD meibomian gland  dysfunction; ATs artificial tears; PFAT's preservative free artificial tears; [place] nuclear sclerotic cataract; PSC posterior subcapsular cataract; ERM epi-retinal membrane; PVD posterior vitreous detachment; RD retinal detachment; DM diabetes mellitus; DR diabetic retinopathy; NPDR non-proliferative diabetic retinopathy; PDR proliferative diabetic retinopathy; CSME clinically significant macular edema; DME diabetic macular edema; dbh dot blot hemorrhages; CWS cotton wool spot; POAG primary open angle glaucoma; C/D cup-to-disc ratio; HVF humphrey visual field; GVF goldmann visual field; OCT optical coherence tomography; IOP intraocular pressure; BRVO Branch retinal vein occlusion; CRVO central retinal vein occlusion; CRAO central retinal artery occlusion; BRAO branch retinal artery occlusion; RT retinal tear; SB scleral buckle; PPV pars plana  vitrectomy; VH Vitreous hemorrhage; PRP panretinal laser photocoagulation; IVK intravitreal kenalog; VMT vitreomacular traction; MH Macular hole;  NVD neovascularization of the disc; NVE neovascularization elsewhere; AREDS age related eye disease study; ARMD age related macular degeneration; POAG primary open angle glaucoma; EBMD epithelial/anterior basement membrane dystrophy; ACIOL anterior chamber intraocular lens; IOL intraocular lens; PCIOL posterior chamber intraocular lens; Phaco/IOL phacoemulsification with intraocular lens placement; [place] photorefractive keratectomy; LASIK laser assisted in situ keratomileusis; HTN hypertension; DM diabetes mellitus; COPD chronic obstructive pulmonary disease

## 2019-07-28 NOTE — Assessment & Plan Note (Signed)
The nature of branch retinal vein occlusion with macular edema was discussed.  The patient was given access to printed information.  The treatment options including continued observation looking for spontaneous resolution versus grid laser versus intravitreal Kenalog injection were discussed.  PRIMARY THERAPY CONSISTS of Anti-VEGF Therapies, AVASTIN, LUCENTIS AND EYLEA.  Their usage was discussed to assist in halting the progression of Macular Edema, in order to preserve, protect or improve acuity.  Additionally, at times, limited focal laser therapy is used in the management.  The risks and benefits of all these options were discussed with the patient.  The patient's questions were answered.  OD, less active and stable.  At 6-week exam interval, repeat intravitreal Avastin today.  Examination in 6 weeks.  Vision is still limited by macular atrophy of the outer retina

## 2019-09-08 ENCOUNTER — Other Ambulatory Visit: Payer: Self-pay

## 2019-09-08 ENCOUNTER — Encounter (INDEPENDENT_AMBULATORY_CARE_PROVIDER_SITE_OTHER): Payer: Self-pay | Admitting: Ophthalmology

## 2019-09-08 ENCOUNTER — Ambulatory Visit (INDEPENDENT_AMBULATORY_CARE_PROVIDER_SITE_OTHER): Payer: Medicare Other | Admitting: Ophthalmology

## 2019-09-08 DIAGNOSIS — H34831 Tributary (branch) retinal vein occlusion, right eye, with macular edema: Secondary | ICD-10-CM | POA: Diagnosis not present

## 2019-09-08 DIAGNOSIS — H43821 Vitreomacular adhesion, right eye: Secondary | ICD-10-CM

## 2019-09-08 MED ORDER — BEVACIZUMAB CHEMO INJECTION 1.25MG/0.05ML SYRINGE FOR KALEIDOSCOPE
1.2500 mg | INTRAVITREAL | Status: AC | PRN
  Administered 2019-09-08: 1.25 mg via INTRAVITREAL

## 2019-09-08 NOTE — Assessment & Plan Note (Signed)
Minor OD and no change

## 2019-09-08 NOTE — Progress Notes (Signed)
09/08/2019     CHIEF COMPLAINT Patient presents for Retina Follow Up   HISTORY OF PRESENT ILLNESS: Lacey Meyer is a 59 y.o. female who presents to the clinic today for:   HPI    Retina Follow Up    Patient presents with  CRVO/BRVO.  In right eye.  Severity is moderate.  Duration of 6 weeks.  Since onset it is stable.  I, the attending physician,  performed the HPI with the patient and updated documentation appropriately.          Comments    6 Week BRVO f\u OD. Possible Avastin OD. OCT  Pt states no changes in vision. BGL: 134 A1C: 8.5       Last edited by Tilda Franco on 09/08/2019  9:27 AM. (History)      Referring physician: Nolene Ebbs, MD [place],  [place] [address]  HISTORICAL INFORMATION:   Selected notes from the MEDICAL RECORD NUMBER    Lab Results  Component Value Date   HGBA1C 10.1 (H) 11/07/2018     CURRENT MEDICATIONS: Current Outpatient Medications (Ophthalmic Drugs)  Medication Sig  . Olopatadine HCl 0.2 % SOLN INSTILL 1 DROP IN [facility] EYE DAILY AS NEEDED   No current facility-administered medications for this visit. (Ophthalmic Drugs)   Current Outpatient Medications (Other)  Medication Sig  . ACCU-CHEK GUIDE test strip USE TO TEST THREE TIMES DAILY (E11.65)  . allopurinol (ZYLOPRIM) 300 MG tablet Take by mouth.  . ALPRAZolam (XANAX) 0.25 MG tablet Take 0.25 mg by mouth daily as needed for anxiety.  . B-D UF III MINI PEN NEEDLES 31G X 5 MM MISC   . benazepril (LOTENSIN) 10 MG tablet Take 10 mg by mouth daily.  . cephALEXin (KEFLEX) 500 MG capsule   . colchicine 0.6 MG tablet Take by mouth.  . diclofenac (VOLTAREN) 75 MG EC tablet Take by mouth.  . diclofenac Sodium (VOLTAREN) 1 % GEL   . diphenoxylate-atropine (LOMOTIL) 2.5-0.025 MG tablet TAKE 1 TABLET BY MOUTH EVERY 6 HOURS AS NEEDED FOR DIARRHEA  . DULoxetine (CYMBALTA) 60 MG capsule Take 60 mg by mouth daily.   . ferrous sulfate 325 (65 FE) MG tablet Take by  mouth.  . fluticasone (FLONASE) 50 MCG/ACT nasal spray USE 2 SPRAY(S) IN EACH NOSTRIL DAILY  . furosemide (LASIX) 40 MG tablet Take 1 tablet (40 mg total) by mouth daily.  Marland Kitchen glimepiride (AMARYL) 4 MG tablet Take 4 mg by mouth 2 (two) times daily.   . Lancets (ONETOUCH DELICA PLUS [id number]) MISC CHECK GLUCOSE 3 TIMES A DAY  . LYRICA 150 MG capsule   . LYRICA 200 MG capsule Take 200 mg by mouth 4 (four) times daily.   . meloxicam (MOBIC) 15 MG tablet TAKE 1 TABLET BY MOUTH ONCE DAILY FOR 30 DAYS  . metFORMIN (GLUCOPHAGE) 500 MG tablet Take 500 mg by mouth 2 (two) times daily.   . methylPREDNISolone (MEDROL DOSEPAK) 4 MG TBPK tablet   . metoprolol succinate (TOPROL-XL) 50 MG 24 hr tablet Take 50 mg by mouth daily.   . Multiple Vitamins-Minerals (BARIATRIC FUSION PO) Take 1 tablet by mouth daily.  . naloxone (NARCAN) 4 MG/0.1ML LIQD nasal spray kit PLEASE SEE ATTACHED FOR DETAILED DIRECTIONS  . nortriptyline (PAMELOR) 50 MG capsule TAKE 1 CAPSULE BY MOUTH DAILY AT BEDTIME  . NOVOLOG FLEXPEN 100 UNIT/ML FlexPen Inject 2-15 Units into the skin 3 (three) times daily with meals.   Marland Kitchen omeprazole (PRILOSEC) 20 MG capsule  Take 1 capsule (20 mg total) by mouth daily.  . ondansetron (ZOFRAN-ODT) 4 MG disintegrating tablet DISSOLVE 1 TABLET IN MOUTH EVERY 8 HOURS AS NEEDED FOR UP TO 7 DAYS FOR NAUSEA OR VOMITING  . Oxycodone HCl 10 MG TABS Take 10 mg by mouth every 8 (eight) hours as needed.  . pantoprazole (PROTONIX) 40 MG tablet   . simvastatin (ZOCOR) 40 MG tablet   . spironolactone (ALDACTONE) 25 MG tablet Take by mouth.  Marland Kitchen tiZANidine (ZANAFLEX) 2 MG tablet TAKE 1 TABLET BY MOUTH AT BEDTIME AS NEEDED FOR PAINS  . triamcinolone cream (KENALOG) 0.5 % APPLY CREAM TOPICALLY TO LEGS TWICE A DAY AS NEEDED  . venlafaxine XR (EFFEXOR-XR) 150 MG 24 hr capsule Take 150 mg by mouth daily with breakfast.  . VENTOLIN HFA 108 (90 Base) MCG/ACT inhaler   . Vitamin D, Ergocalciferol, (DRISDOL) 1.25 MG (50000 UNIT)  CAPS capsule   . XULTOPHY 100-3.6 UNIT-MG/ML SOPN INJECT 20 UNITS SUBCUTANEOUSLY ONCE DAILY IN THE MORNING  . zolpidem (AMBIEN) 10 MG tablet Take 10 mg by mouth at bedtime as needed.   No current facility-administered medications for this visit. (Other)      REVIEW OF SYSTEMS: ROS    Positive for: Endocrine   Last edited by Tilda Franco on 09/08/2019  9:27 AM. (History)       ALLERGIES Allergies  Allergen Reactions  . Ampicillin Anaphylaxis, Hives and Other (See Comments)    Has patient had a PCN reaction causing immediate rash, facial/tongue/throat swelling, SOB or lightheadedness with hypotension: Yes Has patient had a PCN reaction causing severe rash involving mucus membranes or skin necrosis: Yes Has patient had a PCN reaction that required hospitalization Yes Has patient had a PCN reaction occurring within the last 10 years: No If all of the above answers are "NO", then may proceed with Cephalosporin use.   Marland Kitchen Penicillins Anaphylaxis, Hives and Nausea And Vomiting    Has patient had a PCN reaction causing immediate rash, facial/tongue/throat swelling, SOB or lightheadedness with hypotension: Yes Has patient had a PCN reaction causing severe rash involving mucus membranes or skin necrosis: Yes Has patient had a PCN reaction that required hospitalization Yes Has patient had a PCN reaction occurring within the last 10 years: No If all of the above answers are "NO", then may proceed with Cephalosporin use. Has patient had a PCN reaction causing immediate rash, facial/tongue/throat swelling, SOB or lightheadedness with hypotension: Yes Has patient had a PCN reaction causing severe rash involving mucus membranes or skin necrosis: Yes Has patient had a PCN reaction that required hospitalization Yes Has patient had a PCN reaction occurring within the last 10 years: No If all of the above answers are "NO", then may proceed with Cephalosporin use.  Marland Kitchen Doxycycline Other (See  Comments)    unknown unknown unknown  . Other Swelling, Rash and Other (See Comments)    Blistex Blistex Blistex    PAST MEDICAL HISTORY Past Medical History:  Diagnosis Date  . Anemia   . Arthritis   . Diabetes mellitus without complication ([place])   . GERD (gastroesophageal reflux disease)   . Gout   . History of blood transfusion   . HTN (hypertension)   . Impaired vision    Past Surgical History:  Procedure Laterality Date  . BTL    . EYE SURGERY     cataract surgery / right eye   . FOOT SURGERY     bilateral   . LAPAROSCOPIC GASTRIC SLEEVE  RESECTION N/A 01/03/2016   Procedure: LAPAROSCOPIC GASTRIC SLEEVE RESECTION WITH UPPER ENDO;  Surgeon: Alphonsa Overall, MD;  Location: [facility] ORS;  Service: General;  Laterality: N/A;  . SHOULDER ARTHROSCOPY W/ ROTATOR CUFF REPAIR     left   . TUBAL LIGATION      FAMILY HISTORY Family History  Problem Relation Age of Onset  . Hypertension Mother   . Hypertension Father     SOCIAL HISTORY Social History   Tobacco Use  . Smoking status: Former Smoker    Packs/day: 0.25    Years: 1.00    Pack years: 0.25    Types: Cigarettes    Quit date: 03/06/2010    Years since quitting: 9.5  . Smokeless tobacco: Never Used  Substance Use Topics  . Alcohol use: No  . Drug use: No         OPHTHALMIC EXAM:  Base Eye Exam    Visual Acuity (Snellen - Linear)      Right Left   Dist [place] 20/200 20/200   Dist ph [place] NI NI       Tonometry (Tonopen, 9:32 AM)      Right Left   Pressure 12 11       Pupils      Pupils Dark Light Shape React APD   Right PERRL 6 4 Round Brisk None   Left PERRL 6 4 Round Brisk None       Visual Fields (Counting fingers)      Left Right    Full Full       Neuro/Psych    Oriented x3: Yes   Mood/Affect: Normal       Dilation    Right eye: 1.0% Mydriacyl, 2.5% Phenylephrine @ 9:33 AM        Slit Lamp and Fundus Exam    External Exam      Right Left   External Normal Normal       Slit  Lamp Exam      Right Left   Lids/Lashes Normal Normal   Conjunctiva/Sclera White and quiet White and quiet   Cornea Clear Clear   Anterior Chamber Deep and quiet Deep and quiet   Iris Round and reactive Round and reactive   Lens Posterior chamber intraocular lens, 2+ Posterior capsular opacification centrally  Posterior chamber intraocular lens, Centered posterior chamber intraocular lens   Anterior Vitreous Normal Normal       Fundus Exam      Right Left   Posterior Vitreous Normal    Disc Normal    C/D Ratio 0.2    Macula Retinal pigment epithelial mottling    Vessels Old BRVO a collateralization along the superotemporal margin of the nerve    Periphery Normal           IMAGING AND PROCEDURES  Imaging and Procedures for 09/08/19  OCT, Retina - OU - Both Eyes       Right Eye Quality was good. Scan locations included subfoveal. Central Foveal Thickness: 180. Progression has been stable. Findings include vitreomacular adhesion , central retinal atrophy.   Left Eye Quality was good. Scan locations included subfoveal. Central Foveal Thickness: 175. Progression has been stable. Findings include vitreomacular adhesion , central retinal atrophy.   Notes OD, CME resolved currently at 6-week interval on intravitreal Avastin.  Repeat today and examination in 7 weeks       Intravitreal Injection, Pharmacologic Agent - OD - Right Eye       Time Out  09/08/2019. 11:09 AM. Confirmed correct patient, procedure, site, and patient consented.   Anesthesia Topical anesthesia was used. Anesthetic medications included Akten 3.5%.   Procedure Preparation included 10% betadine to eyelids, Ofloxacin . A 30 gauge needle was used.   Injection:  1.25 mg Bevacizumab (AVASTIN) SOLN   NDC: [address]-5, Lot: [id number]   Route: Intravitreal, Site: Right Eye, Waste: 0 mg  Post-op Post injection exam found visual acuity of at least counting fingers. The patient tolerated the procedure well.  There were no complications. The patient received written and verbal post procedure care education. Post injection medications were not given.                 ASSESSMENT/PLAN:  Branch retinal vein occlusion with macular edema of right eye OD, improved and stable at 6-week examination post Avastin  Vitreomacular adhesion of right eye Minor OD and no change      ICD-10-CM   1. Branch retinal vein occlusion with macular edema of right eye  H34.8310 OCT, Retina - OU - Both Eyes    Intravitreal Injection, Pharmacologic Agent - OD - Right Eye    Bevacizumab (AVASTIN) SOLN 1.25 mg  2. Vitreomacular adhesion of right eye  H43.821     1.  OD with branch retinal vein occlusion recurrent CME in the past, improved and stable and resolved at 6-week interval today post Avastin.  We will repeat today  2.  Exam OD in 7 weeks  3.  Ophthalmic Meds Ordered this visit:  Meds ordered this encounter  Medications  . Bevacizumab (AVASTIN) SOLN 1.25 mg       Return in about 7 weeks (around 10/27/2019) for dilate, OD, AVASTIN OCT.  There are no Patient Instructions on file for this visit.   Explained the diagnoses, plan, and follow up with the patient and they expressed understanding.  Patient expressed understanding of the importance of proper follow up care.   Clent Demark Bradyn Vassey M.D. Diseases & Surgery of the Retina and Vitreous Retina & Diabetic [place] 09/08/19     Abbreviations: M myopia (nearsighted); A astigmatism; H hyperopia (farsighted); P presbyopia; Mrx spectacle prescription;  CTL contact lenses; OD right eye; OS left eye; OU both eyes  XT exotropia; ET esotropia; PEK punctate epithelial keratitis; PEE punctate epithelial erosions; DES dry eye syndrome; MGD meibomian gland dysfunction; ATs artificial tears; PFAT's preservative free artificial tears; [place] nuclear sclerotic cataract; PSC posterior subcapsular cataract; ERM epi-retinal membrane; PVD posterior vitreous detachment;  RD retinal detachment; DM diabetes mellitus; DR diabetic retinopathy; NPDR non-proliferative diabetic retinopathy; PDR proliferative diabetic retinopathy; CSME clinically significant macular edema; DME diabetic macular edema; dbh dot blot hemorrhages; CWS cotton wool spot; POAG primary open angle glaucoma; C/D cup-to-disc ratio; HVF humphrey visual field; GVF goldmann visual field; OCT optical coherence tomography; IOP intraocular pressure; BRVO Branch retinal vein occlusion; CRVO central retinal vein occlusion; CRAO central retinal artery occlusion; BRAO branch retinal artery occlusion; RT retinal tear; SB scleral buckle; PPV pars plana vitrectomy; VH Vitreous hemorrhage; PRP panretinal laser photocoagulation; IVK intravitreal kenalog; VMT vitreomacular traction; MH Macular hole;  NVD neovascularization of the disc; NVE neovascularization elsewhere; AREDS age related eye disease study; ARMD age related macular degeneration; POAG primary open angle glaucoma; EBMD epithelial/anterior basement membrane dystrophy; ACIOL anterior chamber intraocular lens; IOL intraocular lens; PCIOL posterior chamber intraocular lens; Phaco/IOL phacoemulsification with intraocular lens placement; [place] photorefractive keratectomy; LASIK laser assisted in situ keratomileusis; HTN hypertension; DM diabetes mellitus; COPD chronic obstructive pulmonary disease

## 2019-09-08 NOTE — Assessment & Plan Note (Signed)
OD, improved and stable at 6-week examination post Avastin

## 2019-10-27 ENCOUNTER — Ambulatory Visit (INDEPENDENT_AMBULATORY_CARE_PROVIDER_SITE_OTHER): Payer: Medicare Other | Admitting: Ophthalmology

## 2019-10-27 ENCOUNTER — Encounter (INDEPENDENT_AMBULATORY_CARE_PROVIDER_SITE_OTHER): Payer: Self-pay | Admitting: Ophthalmology

## 2019-10-27 ENCOUNTER — Other Ambulatory Visit: Payer: Self-pay

## 2019-10-27 DIAGNOSIS — H34831 Tributary (branch) retinal vein occlusion, right eye, with macular edema: Secondary | ICD-10-CM

## 2019-10-27 MED ORDER — BEVACIZUMAB CHEMO INJECTION 1.25MG/0.05ML SYRINGE FOR KALEIDOSCOPE
1.2500 mg | INTRAVITREAL | Status: AC | PRN
  Administered 2019-10-27: 1.25 mg via INTRAVITREAL

## 2019-10-27 NOTE — Progress Notes (Signed)
10/27/2019     CHIEF COMPLAINT Patient presents for Retina Follow Up   HISTORY OF PRESENT ILLNESS: Lacey Meyer is a 59 y.o. female who presents to the clinic today for:   HPI    Retina Follow Up    Patient presents with  CRVO/BRVO.  In right eye.  This started 7 weeks ago.  Severity is mild.  Duration of 7 weeks.  Since onset it is stable.          Comments    7 Week BRVO F/U OD, poss Avastin OD  Pt denies any visual improvement OD. Pt denies changes to [place]. Pt denies new symptoms OU. LBS: 83 this AM       Last edited by Rockie Neighbours, [place] on 10/27/2019  9:36 AM. (History)      Referring physician: Nolene Ebbs, MD [place],  [place] [id number]  HISTORICAL INFORMATION:   Selected notes from the MEDICAL RECORD NUMBER    Lab Results  Component Value Date   HGBA1C 10.1 (H) 11/07/2018     CURRENT MEDICATIONS: Current Outpatient Medications (Ophthalmic Drugs)  Medication Sig   Olopatadine HCl 0.2 % SOLN INSTILL 1 DROP IN [facility] EYE DAILY AS NEEDED   No current facility-administered medications for this visit. (Ophthalmic Drugs)   Current Outpatient Medications (Other)  Medication Sig   ACCU-CHEK GUIDE test strip USE TO TEST THREE TIMES DAILY (E11.65)   allopurinol (ZYLOPRIM) 300 MG tablet Take by mouth.   ALPRAZolam (XANAX) 0.25 MG tablet Take 0.25 mg by mouth daily as needed for anxiety.   B-D UF III MINI PEN NEEDLES 31G X 5 MM MISC    benazepril (LOTENSIN) 10 MG tablet Take 10 mg by mouth daily.   cephALEXin (KEFLEX) 500 MG capsule    colchicine 0.6 MG tablet Take by mouth.   diclofenac (VOLTAREN) 75 MG EC tablet Take by mouth.   diclofenac Sodium (VOLTAREN) 1 % GEL    diphenoxylate-atropine (LOMOTIL) 2.5-0.025 MG tablet TAKE 1 TABLET BY MOUTH EVERY 6 HOURS AS NEEDED FOR DIARRHEA   DULoxetine (CYMBALTA) 60 MG capsule Take 60 mg by mouth daily.    ferrous sulfate 325 (65 FE) MG tablet Take by mouth.   fluticasone (FLONASE) 50  MCG/ACT nasal spray USE 2 SPRAY(S) IN EACH NOSTRIL DAILY   furosemide (LASIX) 40 MG tablet Take 1 tablet (40 mg total) by mouth daily.   glimepiride (AMARYL) 4 MG tablet Take 4 mg by mouth 2 (two) times daily.    Lancets (ONETOUCH DELICA PLUS [id number]) MISC CHECK GLUCOSE 3 TIMES A DAY   LYRICA 150 MG capsule    LYRICA 200 MG capsule Take 200 mg by mouth 4 (four) times daily.    meloxicam (MOBIC) 15 MG tablet TAKE 1 TABLET BY MOUTH ONCE DAILY FOR 30 DAYS   metFORMIN (GLUCOPHAGE) 500 MG tablet Take 500 mg by mouth 2 (two) times daily.    methylPREDNISolone (MEDROL DOSEPAK) 4 MG TBPK tablet    metoprolol succinate (TOPROL-XL) 50 MG 24 hr tablet Take 50 mg by mouth daily.    Multiple Vitamins-Minerals (BARIATRIC FUSION PO) Take 1 tablet by mouth daily.   naloxone (NARCAN) 4 MG/0.1ML LIQD nasal spray kit PLEASE SEE ATTACHED FOR DETAILED DIRECTIONS   nortriptyline (PAMELOR) 50 MG capsule TAKE 1 CAPSULE BY MOUTH DAILY AT BEDTIME   NOVOLOG FLEXPEN 100 UNIT/ML FlexPen Inject 2-15 Units into the skin 3 (three) times daily with meals.    omeprazole (PRILOSEC) 20 MG capsule Take  1 capsule (20 mg total) by mouth daily.   ondansetron (ZOFRAN-ODT) 4 MG disintegrating tablet DISSOLVE 1 TABLET IN MOUTH EVERY 8 HOURS AS NEEDED FOR UP TO 7 DAYS FOR NAUSEA OR VOMITING   Oxycodone HCl 10 MG TABS Take 10 mg by mouth every 8 (eight) hours as needed.   pantoprazole (PROTONIX) 40 MG tablet    simvastatin (ZOCOR) 40 MG tablet    spironolactone (ALDACTONE) 25 MG tablet Take by mouth.   tiZANidine (ZANAFLEX) 2 MG tablet TAKE 1 TABLET BY MOUTH AT BEDTIME AS NEEDED FOR PAINS   triamcinolone cream (KENALOG) 0.5 % APPLY CREAM TOPICALLY TO LEGS TWICE A DAY AS NEEDED   venlafaxine XR (EFFEXOR-XR) 150 MG 24 hr capsule Take 150 mg by mouth daily with breakfast.   VENTOLIN HFA 108 (90 Base) MCG/ACT inhaler    Vitamin D, Ergocalciferol, (DRISDOL) 1.25 MG (50000 UNIT) CAPS capsule    XULTOPHY 100-3.6  UNIT-MG/ML SOPN INJECT 20 UNITS SUBCUTANEOUSLY ONCE DAILY IN THE MORNING   zolpidem (AMBIEN) 10 MG tablet Take 10 mg by mouth at bedtime as needed.   No current facility-administered medications for this visit. (Other)      REVIEW OF SYSTEMS:    ALLERGIES Allergies  Allergen Reactions   Ampicillin Anaphylaxis, Hives and Other (See Comments)    Has patient had a PCN reaction causing immediate rash, facial/tongue/throat swelling, SOB or lightheadedness with hypotension: Yes Has patient had a PCN reaction causing severe rash involving mucus membranes or skin necrosis: Yes Has patient had a PCN reaction that required hospitalization Yes Has patient had a PCN reaction occurring within the last 10 years: No If all of the above answers are "NO", then may proceed with Cephalosporin use.    Penicillins Anaphylaxis, Hives and Nausea And Vomiting    Has patient had a PCN reaction causing immediate rash, facial/tongue/throat swelling, SOB or lightheadedness with hypotension: Yes Has patient had a PCN reaction causing severe rash involving mucus membranes or skin necrosis: Yes Has patient had a PCN reaction that required hospitalization Yes Has patient had a PCN reaction occurring within the last 10 years: No If all of the above answers are "NO", then may proceed with Cephalosporin use. Has patient had a PCN reaction causing immediate rash, facial/tongue/throat swelling, SOB or lightheadedness with hypotension: Yes Has patient had a PCN reaction causing severe rash involving mucus membranes or skin necrosis: Yes Has patient had a PCN reaction that required hospitalization Yes Has patient had a PCN reaction occurring within the last 10 years: No If all of the above answers are "NO", then may proceed with Cephalosporin use.   Doxycycline Other (See Comments)    unknown unknown unknown   Other Swelling, Rash and Other (See Comments)    Blistex Blistex Blistex    PAST MEDICAL  HISTORY Past Medical History:  Diagnosis Date   Anemia    Arthritis    Diabetes mellitus without complication (HCC)    GERD (gastroesophageal reflux disease)    Gout    History of blood transfusion    HTN (hypertension)    Impaired vision    Past Surgical History:  Procedure Laterality Date   BTL     EYE SURGERY     cataract surgery / right eye    FOOT SURGERY     bilateral    LAPAROSCOPIC GASTRIC SLEEVE RESECTION N/A 01/03/2016   Procedure: LAPAROSCOPIC GASTRIC SLEEVE RESECTION WITH UPPER ENDO;  Surgeon: Alphonsa Overall, MD;  Location: [facility];  Service: General;  Laterality: N/A;   SHOULDER ARTHROSCOPY W/ ROTATOR CUFF REPAIR     left    TUBAL LIGATION      FAMILY HISTORY Family History  Problem Relation Age of Onset   Hypertension Mother    Hypertension Father     SOCIAL HISTORY Social History   Tobacco Use   Smoking status: Former Smoker    Packs/day: 0.25    Years: 1.00    Pack years: 0.25    Types: Cigarettes    Quit date: 03/06/2010    Years since quitting: 9.6   Smokeless tobacco: Never Used  Substance Use Topics   Alcohol use: No   Drug use: No         OPHTHALMIC EXAM:  Base Eye Exam    Visual Acuity (ETDRS)      Right Left   Dist [place] 20/200 20/200   Dist ph [place] NI NI       Tonometry (Tonopen, 9:39 AM)      Right Left   Pressure 17 13       Pupils      Pupils Dark Light Shape React APD   Right PERRL 5 4 Round Brisk None   Left PERRL 5 4 Round Brisk None       Visual Fields (Counting fingers)      Left Right    Full Full       Extraocular Movement      Right Left    Full Full       Neuro/Psych    Oriented x3: Yes   Mood/Affect: Normal       Dilation    Right eye: 1.0% Mydriacyl, 2.5% Phenylephrine @ 9:39 AM        Slit Lamp and Fundus Exam    External Exam      Right Left   External Normal Normal       Slit Lamp Exam      Right Left   Lids/Lashes Normal Normal   Conjunctiva/Sclera White and  quiet White and quiet   Cornea Clear Clear   Anterior Chamber Deep and quiet Deep and quiet   Iris Round and reactive Round and reactive   Lens Posterior chamber intraocular lens, 2+ Posterior capsular opacification centrally  Posterior chamber intraocular lens, Centered posterior chamber intraocular lens   Anterior Vitreous Normal Normal       Fundus Exam      Right Left   Posterior Vitreous Normal    Disc Normal    C/D Ratio 0.2    Macula Retinal pigment epithelial mottling    Vessels Old BRVO a collateralization along the superotemporal margin of the nerve    Periphery Normal           IMAGING AND PROCEDURES  Imaging and Procedures for 10/27/19  OCT, Retina - OU - Both Eyes       Right Eye Quality was good. Scan locations included subfoveal. Central Foveal Thickness: 174. Progression has improved. Findings include central retinal atrophy, outer retinal atrophy.   Left Eye Quality was good. Scan locations included subfoveal. Central Foveal Thickness: 174. Progression has been stable.   Notes To the massive CME noted January 2021, much less CME in the fovea as well as along the superotemporal macula arcade post intravitreal Avastin today at 7-week interval       Intravitreal Injection, Pharmacologic Agent - OD - Right Eye       Time Out 10/27/2019. 10:38 AM. Confirmed correct patient, procedure,  site, and patient consented.   Anesthesia Topical anesthesia was used. Anesthetic medications included [place] 3.5%.   Procedure Preparation included 5% betadine to ocular surface, 10% betadine to eyelids, Tobramycin 0.3%, Ofloxacin . A supplied needle was used.   Injection:  1.25 mg Bevacizumab (AVASTIN) SOLN   NDC: [address]-1, Lot: [id number]   Route: Intravitreal, Site: Right Eye, Waste: 0 mg  Post-op Post injection exam found visual acuity of at least counting fingers. The patient tolerated the procedure well. There were no complications. The patient received written  and verbal post procedure care education. Post injection medications were not given.                 ASSESSMENT/PLAN:  Branch retinal vein occlusion with macular edema of right eye At current interval 7-week follow-up, post Avastin, CME continues to be resolved OD.  Injection intravitreal Avastin OD today and examination in 9 weeks      ICD-10-CM   1. Branch retinal vein occlusion with macular edema of right eye  H34.8310 OCT, Retina - OU - Both Eyes    Intravitreal Injection, Pharmacologic Agent - OD - Right Eye    Bevacizumab (AVASTIN) SOLN 1.25 mg    1.  7-week interval follow-up today, branch retinal vein occlusion with CME continues to remain stable and resolved, will extend interval examination next to 9 weeks  2.  3.  Ophthalmic Meds Ordered this visit:  Meds ordered this encounter  Medications   Bevacizumab (AVASTIN) SOLN 1.25 mg       Return in about 9 weeks (around 12/29/2019) for AVASTIN OCT, OD, dilate.  Patient Instructions  She will notify the office promptly if new visual acuity decline or distortion develops Central Retinal Vein Occlusion, Adult  Central retinal vein occlusion (CRVO) is a blockage in the main blood vessel that carries blood away from the retina (central retinal vein). The retina is the part of your eye that senses light and sends signals to the brain that allow you to see. CRVO can cause blurry vision. It can also cause complete or partial vision loss. The condition usually affects only one eye. What are the causes? This condition is usually caused by a blood clot that forms inside the central retinal vein. A clot is blood that has thickened into a gel or solid. Vision loss happens because the blockage in the vein causes fluid to leak out of the vein and collect in the retina. Sometimes the cause is not known. What increases the risk? This condition is more likely to develop in:  People who have a blood vessel disease that causes  narrowing of the arteries (atherosclerosis). This is the main risk factor for this condition.  People who are age 15 or older.  People who have any of the following medical conditions: ? High blood pressure (hypertension). ? Diabetes. ? Heart disease. ? High levels of fat in the blood (hyperlipidemia). ? Increased pressure inside the eye (glaucoma). ? Disorders that increase blood clotting (coagulopathy). What are the signs or symptoms? Symptoms of this condition include:  Sudden and painless vision loss. If the vision loss is partial, it may get worse over a span of hours or days.  Sudden and painless blurry vision.  Tiny spots or clumps that move across your vision (floaters).  Pain or pressure in your eye (uncommon). How is this diagnosed? This condition is usually diagnosed by a health care provider who specializes in eye diseases (ophthalmologist). To diagnose the condition, the ophthalmologist will do  an eye exam. She or he may also:  Do an exam that involves having eye drops put in the eye (slit lamp exam or dilated fundus exam).  Order tests that help diagnose the condition, such as: ? A vision test. ? A measurement of the pressure inside your eye. ? A fluorescein angiogram. In this test, pictures are taken of your retina after a dye is injected into your bloodstream. ? Optical coherence tomography. In this test, light waves are used to create pictures of your retina.  Check your blood pressure.  Order tests to find the cause of your condition and rule out other conditions. The tests may check for these problems: ? Diabetes. ? High levels of fat or cholesterol in your blood. ? Abnormal blood clotting. How is this treated? There is no cure for this condition. The goals of treatment are to save as much of your vision as possible and to prevent the condition from happening again. Treatment may include:  Medicine to reduce swelling in your eye. The medicine may be  injected in your eye, or an implant may be placed in your eye to release the medicine.  Laser treatment to reduce swelling and stop fluid leakage in the eye (focal laser treatment).  Laser treatment to seal or destroy abnormal blood vessels (pan-retinal laser treatment). Follow these instructions at home:  Use over-the-counter and prescription medicines only as told by your health care provider.  Keep all follow-up visits as told by your health care provider. This is important. How is this prevented? To help prevent this condition:  Work with your health care providers to reduce your risk factors.  Do not use any products that contain nicotine or tobacco, such as cigarettes and e-cigarettes. If you need help quitting, ask your health care provider.  Follow a heart-healthy diet. This diet includes a lot of fruits, vegetables, grains, and lean protein. It is low in saturated fat and sugar.  Maintain a healthy weight.  Exercise regularly. Contact a health care provider if:  You have any changes in your vision. Get help right away if:  You have eye pain.  You have symptoms of this condition, including blurry vision or floaters.  You have new or sudden vision loss. Summary  Central retinal vein occlusion (CRVO) is a blockage in the main blood vessel that carries blood away from the retina (central retinal vein). The retina is the part of your eye that senses light and sends signals to the brain that allow you to see.  CRVO can cause blurry vision. It can also cause complete or partial vision loss. The condition usually affects only one eye.  There is no cure for this condition. The goals of treatment are to save as much of your vision as possible and to prevent the condition from happening again.  Get help right away if you have new or sudden vision loss. This information is not intended to replace advice given to you by your health care provider. Make sure you discuss any  questions you have with your health care provider. Document Revised: 02/08/2017 Document Reviewed: 05/04/2016 Elsevier Patient Education  2020 [employer].     Explained the diagnoses, plan, and follow up with the patient and they expressed understanding.  Patient expressed understanding of the importance of proper follow up care.   Clent Demark Vauda Salvucci M.D. Diseases & Surgery of the Retina and Vitreous Retina [facility] Diabetic [place] 10/27/19     Abbreviations: M myopia (nearsighted); A astigmatism;  H hyperopia (farsighted); P presbyopia; Mrx spectacle prescription;  CTL contact lenses; OD right eye; OS left eye; OU both eyes  XT exotropia; ET esotropia; PEK punctate epithelial keratitis; PEE punctate epithelial erosions; DES dry eye syndrome; MGD meibomian gland dysfunction; ATs artificial tears; PFAT's preservative free artificial tears; [place] nuclear sclerotic cataract; PSC posterior subcapsular cataract; ERM epi-retinal membrane; PVD posterior vitreous detachment; RD retinal detachment; DM diabetes mellitus; DR diabetic retinopathy; NPDR non-proliferative diabetic retinopathy; PDR proliferative diabetic retinopathy; CSME clinically significant macular edema; DME diabetic macular edema; dbh dot blot hemorrhages; CWS cotton wool spot; POAG primary open angle glaucoma; C/D cup-to-disc ratio; HVF humphrey visual field; GVF goldmann visual field; OCT optical coherence tomography; IOP intraocular pressure; BRVO Branch retinal vein occlusion; CRVO central retinal vein occlusion; CRAO central retinal artery occlusion; BRAO branch retinal artery occlusion; RT retinal tear; SB scleral buckle; PPV pars plana vitrectomy; VH Vitreous hemorrhage; PRP panretinal laser photocoagulation; IVK intravitreal kenalog; VMT vitreomacular traction; MH Macular hole;  NVD neovascularization of the disc; NVE neovascularization elsewhere; AREDS age related eye disease study; ARMD age related macular degeneration; POAG primary open  angle glaucoma; EBMD epithelial/anterior basement membrane dystrophy; ACIOL anterior chamber intraocular lens; IOL intraocular lens; PCIOL posterior chamber intraocular lens; Phaco/IOL phacoemulsification with intraocular lens placement; [place] photorefractive keratectomy; LASIK laser assisted in situ keratomileusis; HTN hypertension; DM diabetes mellitus; COPD chronic obstructive pulmonary disease

## 2019-10-27 NOTE — Patient Instructions (Signed)
She will notify the office promptly if new visual acuity decline or distortion develops Central Retinal Vein Occlusion, Adult  Central retinal vein occlusion (CRVO) is a blockage in the main blood vessel that carries blood away from the retina (central retinal vein). The retina is the part of your eye that senses light and sends signals to the brain that allow you to see. CRVO can cause blurry vision. It can also cause complete or partial vision loss. The condition usually affects only one eye. What are the causes? This condition is usually caused by a blood clot that forms inside the central retinal vein. A clot is blood that has thickened into a gel or solid. Vision loss happens because the blockage in the vein causes fluid to leak out of the vein and collect in the retina. Sometimes the cause is not known. What increases the risk? This condition is more likely to develop in:  People who have a blood vessel disease that causes narrowing of the arteries (atherosclerosis). This is the main risk factor for this condition.  People who are age 75 or older.  People who have any of the following medical conditions: ? High blood pressure (hypertension). ? Diabetes. ? Heart disease. ? High levels of fat in the blood (hyperlipidemia). ? Increased pressure inside the eye (glaucoma). ? Disorders that increase blood clotting (coagulopathy). What are the signs or symptoms? Symptoms of this condition include:  Sudden and painless vision loss. If the vision loss is partial, it may get worse over a span of hours or days.  Sudden and painless blurry vision.  Tiny spots or clumps that move across your vision (floaters).  Pain or pressure in your eye (uncommon). How is this diagnosed? This condition is usually diagnosed by a health care provider who specializes in eye diseases (ophthalmologist). To diagnose the condition, the ophthalmologist will do an eye exam. She or he may also:  Do an exam that  involves having eye drops put in the eye (slit lamp exam or dilated fundus exam).  Order tests that help diagnose the condition, such as: ? A vision test. ? A measurement of the pressure inside your eye. ? A fluorescein angiogram. In this test, pictures are taken of your retina after a dye is injected into your bloodstream. ? Optical coherence tomography. In this test, light waves are used to create pictures of your retina.  Check your blood pressure.  Order tests to find the cause of your condition and rule out other conditions. The tests may check for these problems: ? Diabetes. ? High levels of fat or cholesterol in your blood. ? Abnormal blood clotting. How is this treated? There is no cure for this condition. The goals of treatment are to save as much of your vision as possible and to prevent the condition from happening again. Treatment may include:  Medicine to reduce swelling in your eye. The medicine may be injected in your eye, or an implant may be placed in your eye to release the medicine.  Laser treatment to reduce swelling and stop fluid leakage in the eye (focal laser treatment).  Laser treatment to seal or destroy abnormal blood vessels (pan-retinal laser treatment). Follow these instructions at home:  Use over-the-counter and prescription medicines only as told by your health care provider.  Keep all follow-up visits as told by your health care provider. This is important. How is this prevented? To help prevent this condition:  Work with your health care providers to reduce your  risk factors.  Do not use any products that contain nicotine or tobacco, such as cigarettes and e-cigarettes. If you need help quitting, ask your health care provider.  Follow a heart-healthy diet. This diet includes a lot of fruits, vegetables, grains, and lean protein. It is low in saturated fat and sugar.  Maintain a healthy weight.  Exercise regularly. Contact a health care provider  if:  You have any changes in your vision. Get help right away if:  You have eye pain.  You have symptoms of this condition, including blurry vision or floaters.  You have new or sudden vision loss. Summary  Central retinal vein occlusion (CRVO) is a blockage in the main blood vessel that carries blood away from the retina (central retinal vein). The retina is the part of your eye that senses light and sends signals to the brain that allow you to see.  CRVO can cause blurry vision. It can also cause complete or partial vision loss. The condition usually affects only one eye.  There is no cure for this condition. The goals of treatment are to save as much of your vision as possible and to prevent the condition from happening again.  Get help right away if you have new or sudden vision loss. This information is not intended to replace advice given to you by your health care provider. Make sure you discuss any questions you have with your health care provider. Document Revised: 02/08/2017 Document Reviewed: 05/04/2016 Elsevier Patient Education  2020 [employer].

## 2019-10-27 NOTE — Assessment & Plan Note (Signed)
At current interval 7-week follow-up, post Avastin, CME continues to be resolved OD.  Injection intravitreal Avastin OD today and examination in 9 weeks

## 2019-12-30 ENCOUNTER — Ambulatory Visit: Payer: Medicare Other | Admitting: Podiatry

## 2019-12-30 ENCOUNTER — Encounter (INDEPENDENT_AMBULATORY_CARE_PROVIDER_SITE_OTHER): Payer: Medicare Other | Admitting: Ophthalmology

## 2020-01-04 ENCOUNTER — Other Ambulatory Visit: Payer: Self-pay

## 2020-01-04 ENCOUNTER — Encounter (INDEPENDENT_AMBULATORY_CARE_PROVIDER_SITE_OTHER): Payer: Self-pay | Admitting: Ophthalmology

## 2020-01-04 ENCOUNTER — Ambulatory Visit (INDEPENDENT_AMBULATORY_CARE_PROVIDER_SITE_OTHER): Payer: Medicare Other | Admitting: Ophthalmology

## 2020-01-04 DIAGNOSIS — H34831 Tributary (branch) retinal vein occlusion, right eye, with macular edema: Secondary | ICD-10-CM

## 2020-01-04 DIAGNOSIS — H43821 Vitreomacular adhesion, right eye: Secondary | ICD-10-CM | POA: Diagnosis not present

## 2020-01-04 MED ORDER — BEVACIZUMAB CHEMO INJECTION 1.25MG/0.05ML SYRINGE FOR KALEIDOSCOPE
1.2500 mg | INTRAVITREAL | Status: AC | PRN
  Administered 2020-01-04: 1.25 mg via INTRAVITREAL

## 2020-01-04 NOTE — Assessment & Plan Note (Signed)
Vitreomacular traction may cause vision loss from anatomic distortion to the center of the vision, the macula.  If visual function is symptomatic or threatened, therapy may be needed.  Surgical intervention offers the highest chance of visual stability and improvement.  Distortion of the macula anatomy may cause splitting of the retinal layers, termed foveomacular retinoschisis, which can cause more permanent vision loss.  Epiretinal membranes may also be associated.  Macular hole may also develop if vitreomacular traction progresses. The minor form of this condition is Vitreomacular adhesion, which is a natural change in the aging process of the eye, which requires observation only.   OU minor at this time

## 2020-01-04 NOTE — Progress Notes (Signed)
01/04/2020     CHIEF COMPLAINT Patient presents for Retina Follow Up   HISTORY OF PRESENT ILLNESS: Lacey Meyer is a 59 y.o. female who presents to the clinic today for:   HPI    Retina Follow Up    Patient presents with  CRVO/BRVO.  In right eye.  Severity is moderate.  Duration of 10 weeks.  Since onset it is stable.  I, the attending physician,  performed the HPI with the patient and updated documentation appropriately.          Comments    10 Week BRVO f\u OD. Possible Avastin OD. OCT  Pt states no changes in vision. Pt denies any new complaints. BGL: 160 this AM       Last edited by Tilda Franco on 01/04/2020 10:26 AM. (History)      Referring physician: Nolene Ebbs, MD [place],  [place] [address]  HISTORICAL INFORMATION:   Selected notes from the MEDICAL RECORD NUMBER    Lab Results  Component Value Date   HGBA1C 10.1 (H) 11/07/2018     CURRENT MEDICATIONS: Current Outpatient Medications (Ophthalmic Drugs)  Medication Sig  . Olopatadine HCl 0.2 % SOLN INSTILL 1 DROP IN [facility] EYE DAILY AS NEEDED   No current facility-administered medications for this visit. (Ophthalmic Drugs)   Current Outpatient Medications (Other)  Medication Sig  . ACCU-CHEK GUIDE test strip USE TO TEST THREE TIMES DAILY (E11.65)  . allopurinol (ZYLOPRIM) 300 MG tablet Take by mouth.  . ALPRAZolam (XANAX) 0.25 MG tablet Take 0.25 mg by mouth daily as needed for anxiety.  . B-D UF III MINI PEN NEEDLES 31G X 5 MM MISC   . benazepril (LOTENSIN) 10 MG tablet Take 10 mg by mouth daily.  . cephALEXin (KEFLEX) 500 MG capsule   . colchicine 0.6 MG tablet Take by mouth.  . diclofenac (VOLTAREN) 75 MG EC tablet Take by mouth.  . diclofenac Sodium (VOLTAREN) 1 % GEL   . diphenoxylate-atropine (LOMOTIL) 2.5-0.025 MG tablet TAKE 1 TABLET BY MOUTH EVERY 6 HOURS AS NEEDED FOR DIARRHEA  . DULoxetine (CYMBALTA) 60 MG capsule Take 60 mg by mouth daily.   . ferrous  sulfate 325 (65 FE) MG tablet Take by mouth.  . fluticasone (FLONASE) 50 MCG/ACT nasal spray USE 2 SPRAY(S) IN EACH NOSTRIL DAILY  . furosemide (LASIX) 40 MG tablet Take 1 tablet (40 mg total) by mouth daily.  Marland Kitchen glimepiride (AMARYL) 4 MG tablet Take 4 mg by mouth 2 (two) times daily.   . Lancets (ONETOUCH DELICA PLUS [id number]) MISC CHECK GLUCOSE 3 TIMES A DAY  . LYRICA 150 MG capsule   . LYRICA 200 MG capsule Take 200 mg by mouth 4 (four) times daily.   . meloxicam (MOBIC) 15 MG tablet TAKE 1 TABLET BY MOUTH ONCE DAILY FOR 30 DAYS  . metFORMIN (GLUCOPHAGE) 500 MG tablet Take 500 mg by mouth 2 (two) times daily.   . methylPREDNISolone (MEDROL DOSEPAK) 4 MG TBPK tablet   . metoprolol succinate (TOPROL-XL) 50 MG 24 hr tablet Take 50 mg by mouth daily.   . Multiple Vitamins-Minerals (BARIATRIC FUSION PO) Take 1 tablet by mouth daily.  . naloxone (NARCAN) 4 MG/0.1ML LIQD nasal spray kit PLEASE SEE ATTACHED FOR DETAILED DIRECTIONS  . nortriptyline (PAMELOR) 50 MG capsule TAKE 1 CAPSULE BY MOUTH DAILY AT BEDTIME  . NOVOLOG FLEXPEN 100 UNIT/ML FlexPen Inject 2-15 Units into the skin 3 (three) times daily with meals.   Marland Kitchen omeprazole (  PRILOSEC) 20 MG capsule Take 1 capsule (20 mg total) by mouth daily.  . ondansetron (ZOFRAN-ODT) 4 MG disintegrating tablet DISSOLVE 1 TABLET IN MOUTH EVERY 8 HOURS AS NEEDED FOR UP TO 7 DAYS FOR NAUSEA OR VOMITING  . Oxycodone HCl 10 MG TABS Take 10 mg by mouth every 8 (eight) hours as needed.  . pantoprazole (PROTONIX) 40 MG tablet   . simvastatin (ZOCOR) 40 MG tablet   . spironolactone (ALDACTONE) 25 MG tablet Take by mouth.  Marland Kitchen tiZANidine (ZANAFLEX) 2 MG tablet TAKE 1 TABLET BY MOUTH AT BEDTIME AS NEEDED FOR PAINS  . triamcinolone cream (KENALOG) 0.5 % APPLY CREAM TOPICALLY TO LEGS TWICE A DAY AS NEEDED  . venlafaxine XR (EFFEXOR-XR) 150 MG 24 hr capsule Take 150 mg by mouth daily with breakfast.  . VENTOLIN HFA 108 (90 Base) MCG/ACT inhaler   . Vitamin D,  Ergocalciferol, (DRISDOL) 1.25 MG (50000 UNIT) CAPS capsule   . XULTOPHY 100-3.6 UNIT-MG/ML SOPN INJECT 20 UNITS SUBCUTANEOUSLY ONCE DAILY IN THE MORNING  . zolpidem (AMBIEN) 10 MG tablet Take 10 mg by mouth at bedtime as needed.   No current facility-administered medications for this visit. (Other)      REVIEW OF SYSTEMS: ROS    Positive for: Endocrine   Last edited by Tilda Franco on 01/04/2020 10:26 AM. (History)       ALLERGIES Allergies  Allergen Reactions  . Ampicillin Anaphylaxis, Hives and Other (See Comments)    Has patient had a PCN reaction causing immediate rash, facial/tongue/throat swelling, SOB or lightheadedness with hypotension: Yes Has patient had a PCN reaction causing severe rash involving mucus membranes or skin necrosis: Yes Has patient had a PCN reaction that required hospitalization Yes Has patient had a PCN reaction occurring within the last 10 years: No If all of the above answers are "NO", then may proceed with Cephalosporin use.   Marland Kitchen Penicillins Anaphylaxis, Hives and Nausea And Vomiting    Has patient had a PCN reaction causing immediate rash, facial/tongue/throat swelling, SOB or lightheadedness with hypotension: Yes Has patient had a PCN reaction causing severe rash involving mucus membranes or skin necrosis: Yes Has patient had a PCN reaction that required hospitalization Yes Has patient had a PCN reaction occurring within the last 10 years: No If all of the above answers are "NO", then may proceed with Cephalosporin use. Has patient had a PCN reaction causing immediate rash, facial/tongue/throat swelling, SOB or lightheadedness with hypotension: Yes Has patient had a PCN reaction causing severe rash involving mucus membranes or skin necrosis: Yes Has patient had a PCN reaction that required hospitalization Yes Has patient had a PCN reaction occurring within the last 10 years: No If all of the above answers are "NO", then may proceed with  Cephalosporin use.  Marland Kitchen Doxycycline Other (See Comments)    unknown unknown unknown  . Other Swelling, Rash and Other (See Comments)    Blistex Blistex Blistex    PAST MEDICAL HISTORY Past Medical History:  Diagnosis Date  . Anemia   . Arthritis   . Diabetes mellitus without complication ([place])   . GERD (gastroesophageal reflux disease)   . Gout   . History of blood transfusion   . HTN (hypertension)   . Impaired vision    Past Surgical History:  Procedure Laterality Date  . BTL    . EYE SURGERY     cataract surgery / right eye   . FOOT SURGERY     bilateral   .  LAPAROSCOPIC GASTRIC SLEEVE RESECTION N/A 01/03/2016   Procedure: LAPAROSCOPIC GASTRIC SLEEVE RESECTION WITH UPPER ENDO;  Surgeon: Alphonsa Overall, MD;  Location: [facility];  Service: General;  Laterality: N/A;  . SHOULDER ARTHROSCOPY W/ ROTATOR CUFF REPAIR     left   . TUBAL LIGATION      FAMILY HISTORY Family History  Problem Relation Age of Onset  . Hypertension Mother   . Hypertension Father     SOCIAL HISTORY Social History   Tobacco Use  . Smoking status: Former Smoker    Packs/day: 0.25    Years: 1.00    Pack years: 0.25    Types: Cigarettes    Quit date: 03/06/2010    Years since quitting: 9.8  . Smokeless tobacco: Never Used  Substance Use Topics  . Alcohol use: No  . Drug use: No         OPHTHALMIC EXAM:  Base Eye Exam    Visual Acuity (Snellen - Linear)      Right Left   Dist Walker 20/200 -1 20/200   Dist ph  NI NI       Tonometry (Tonopen, 10:31 AM)      Right Left   Pressure 24 25       Tonometry #2 (Tonopen, 10:31 AM)      Right Left   Pressure 22 22       Pupils      Pupils Dark Light Shape React APD   Right PERRL 5 4 Round Slow None   Left PERRL 5 4 Round Slow None       Visual Fields (Counting fingers)      Left Right    Full Full       Neuro/Psych    Oriented x3: Yes   Mood/Affect: Normal       Dilation    Right eye: 1.0% Mydriacyl, 2.5% Phenylephrine  @ 10:31 AM        Slit Lamp and Fundus Exam    External Exam      Right Left   External Normal Normal       Slit Lamp Exam      Right Left   Lids/Lashes Normal Normal   Conjunctiva/Sclera White and quiet White and quiet   Cornea Clear Clear   Anterior Chamber Deep and quiet Deep and quiet   Iris Round and reactive Round and reactive   Lens Posterior chamber intraocular lens, 2+ Posterior capsular opacification centrally  Posterior chamber intraocular lens, Centered posterior chamber intraocular lens   Anterior Vitreous Normal Normal       Fundus Exam      Right Left   Posterior Vitreous Normal    Disc Normal    C/D Ratio 0.2    Macula Retinal pigment epithelial mottling    Vessels Old BRVO a collateralization along the superotemporal margin of the nerve    Periphery Normal, good  prp           IMAGING AND PROCEDURES  Imaging and Procedures for 01/04/20  OCT, Retina - OU - Both Eyes       Right Eye Quality was good. Scan locations included subfoveal. Central Foveal Thickness: 173. Findings include vitreomacular adhesion .   Left Eye Quality was good. Scan locations included subfoveal. Central Foveal Thickness: 169. Findings include vitreomacular adhesion .   Notes Active CME OD from a history of branch retinal vein occlusion, recovered and improved since January 2021 post Avastin currently at 10-week interval.  Intravitreal Injection, Pharmacologic Agent - OD - Right Eye       Time Out 01/04/2020. 11:38 AM. Confirmed correct patient, procedure, site, and patient consented.   Anesthesia Topical anesthesia was used. Anesthetic medications included Akten 3.5%.   Procedure Preparation included 5% betadine to ocular surface, 10% betadine to eyelids, Tobramycin 0.3%, Ofloxacin . A supplied needle was used.   Injection:  1.25 mg Bevacizumab (AVASTIN) SOLN   NDC: 70360-001-02, Lot: [id number]   Route: Intravitreal, Site: Right Eye, Waste: 0  mg  Post-op Post injection exam found visual acuity of at least counting fingers. The patient tolerated the procedure well. There were no complications. The patient received written and verbal post procedure care education. Post injection medications were not given.                 ASSESSMENT/PLAN:  Branch retinal vein occlusion with macular edema of right eye Stable OD currently at 10-week follow-up interval on intravitreal Avastin, will follow up again in 10 weeks  Vitreomacular adhesion of right eye Vitreomacular traction may cause vision loss from anatomic distortion to the center of the vision, the macula.  If visual function is symptomatic or threatened, therapy may be needed.  Surgical intervention offers the highest chance of visual stability and improvement.  Distortion of the macula anatomy may cause splitting of the retinal layers, termed foveomacular retinoschisis, which can cause more permanent vision loss.  Epiretinal membranes may also be associated.  Macular hole may also develop if vitreomacular traction progresses. The minor form of this condition is Vitreomacular adhesion, which is a natural change in the aging process of the eye, which requires observation only.   OU minor at this time      ICD-10-CM   1. Branch retinal vein occlusion with macular edema of right eye  H34.8310 OCT, Retina - OU - Both Eyes    Intravitreal Injection, Pharmacologic Agent - OD - Right Eye    Bevacizumab (AVASTIN) SOLN 1.25 mg  2. Vitreomacular adhesion of right eye  H43.821     1.  2.  3.  Ophthalmic Meds Ordered this visit:  Meds ordered this encounter  Medications  . Bevacizumab (AVASTIN) SOLN 1.25 mg       Return in about 10 weeks (around 03/14/2020) for DILATE OU, AVASTIN OCT, OD.  Patient Instructions  Patient instructed to follow-up promptly with new onset visual acuity decline or distortion    Explained the diagnoses, plan, and follow up with the patient and  they expressed understanding.  Patient expressed understanding of the importance of proper follow up care.   Clent Demark Reyn Faivre M.D. Diseases & Surgery of the Retina and Vitreous [facility] [place] 01/04/20     Abbreviations: M myopia (nearsighted); A astigmatism; H hyperopia (farsighted); P presbyopia; Mrx spectacle prescription;  CTL contact lenses; OD right eye; OS left eye; OU both eyes  XT exotropia; ET esotropia; PEK punctate epithelial keratitis; PEE punctate epithelial erosions; DES dry eye syndrome; MGD meibomian gland dysfunction; ATs artificial tears; PFAT's preservative free artificial tears; [place] nuclear sclerotic cataract; PSC posterior subcapsular cataract; ERM epi-retinal membrane; PVD posterior vitreous detachment; RD retinal detachment; DM diabetes mellitus; DR diabetic retinopathy; NPDR non-proliferative diabetic retinopathy; PDR proliferative diabetic retinopathy; CSME clinically significant macular edema; DME diabetic macular edema; dbh dot blot hemorrhages; CWS cotton wool spot; POAG primary open angle glaucoma; C/D cup-to-disc ratio; HVF humphrey visual field; GVF goldmann visual field; OCT optical coherence tomography; IOP intraocular pressure; BRVO Branch retinal vein  occlusion; CRVO central retinal vein occlusion; CRAO central retinal artery occlusion; BRAO branch retinal artery occlusion; RT retinal tear; SB scleral buckle; PPV pars plana vitrectomy; VH Vitreous hemorrhage; PRP panretinal laser photocoagulation; IVK intravitreal kenalog; VMT vitreomacular traction; MH Macular hole;  NVD neovascularization of the disc; NVE neovascularization elsewhere; AREDS age related eye disease study; ARMD age related macular degeneration; POAG primary open angle glaucoma; EBMD epithelial/anterior basement membrane dystrophy; ACIOL anterior chamber intraocular lens; IOL intraocular lens; PCIOL posterior chamber intraocular lens; Phaco/IOL phacoemulsification with intraocular lens  placement; [place] photorefractive keratectomy; LASIK laser assisted in situ keratomileusis; HTN hypertension; DM diabetes mellitus; COPD chronic obstructive pulmonary disease

## 2020-01-04 NOTE — Assessment & Plan Note (Signed)
Stable OD currently at 10-week follow-up interval on intravitreal Avastin, will follow up again in 10 weeks

## 2020-01-04 NOTE — Patient Instructions (Signed)
Patient instructed to follow-up promptly with new onset visual acuity decline or distortion

## 2020-01-12 ENCOUNTER — Other Ambulatory Visit: Payer: Self-pay | Admitting: Internal Medicine

## 2020-01-12 DIAGNOSIS — Z1231 Encounter for screening mammogram for malignant neoplasm of breast: Secondary | ICD-10-CM

## 2020-01-13 ENCOUNTER — Ambulatory Visit: Payer: Medicare Other

## 2020-01-13 ENCOUNTER — Other Ambulatory Visit: Payer: Self-pay

## 2020-01-13 ENCOUNTER — Ambulatory Visit
Admission: RE | Admit: 2020-01-13 | Discharge: 2020-01-13 | Disposition: A | Discharge status: Home / Self Care | Payer: Medicare Other | Source: Ambulatory Visit | Attending: Internal Medicine | Admitting: Internal Medicine

## 2020-01-13 DIAGNOSIS — Z1231 Encounter for screening mammogram for malignant neoplasm of breast: Secondary | ICD-10-CM

## 2020-02-01 ENCOUNTER — Ambulatory Visit: Payer: Medicare Other | Admitting: Obstetrics

## 2020-02-17 ENCOUNTER — Ambulatory Visit: Payer: Medicare Other | Admitting: Obstetrics

## 2020-03-14 ENCOUNTER — Encounter (INDEPENDENT_AMBULATORY_CARE_PROVIDER_SITE_OTHER): Payer: Medicare Other | Admitting: Ophthalmology

## 2020-03-14 ENCOUNTER — Encounter (INDEPENDENT_AMBULATORY_CARE_PROVIDER_SITE_OTHER): Payer: Self-pay

## 2020-04-01 ENCOUNTER — Other Ambulatory Visit: Payer: Self-pay

## 2020-04-01 ENCOUNTER — Encounter: Payer: Self-pay | Admitting: Podiatry

## 2020-04-01 ENCOUNTER — Ambulatory Visit (INDEPENDENT_AMBULATORY_CARE_PROVIDER_SITE_OTHER): Payer: Medicare Other | Admitting: Podiatry

## 2020-04-01 DIAGNOSIS — M79674 Pain in right toe(s): Secondary | ICD-10-CM

## 2020-04-01 DIAGNOSIS — M79675 Pain in left toe(s): Secondary | ICD-10-CM | POA: Diagnosis not present

## 2020-04-01 DIAGNOSIS — Z794 Long term (current) use of insulin: Secondary | ICD-10-CM

## 2020-04-01 DIAGNOSIS — E1140 Type 2 diabetes mellitus with diabetic neuropathy, unspecified: Secondary | ICD-10-CM | POA: Diagnosis not present

## 2020-04-01 DIAGNOSIS — B351 Tinea unguium: Secondary | ICD-10-CM

## 2020-04-01 NOTE — Progress Notes (Signed)
Complaint:  Visit Type: Patient returns to my office for continued preventative foot care services. Complaint: Patient states" my nails have grown long and thick and become painful to walk and wear shoes" Patient has been diagnosed with DM with no foot complications. The patient presents for preventative foot care services. No changes to ROS.  Patient has had surgery with Dr.  Hyatt.  Podiatric Exam: Vascular: dorsalis pedis and posterior tibial pulses are palpable bilateral. Capillary return is immediate. Temperature gradient is WNL. Skin turgor WNL  Sensorium: Diminished  Semmes Weinstein monofilament test. Normal tactile sensation bilaterally. Nail Exam: Pt has thick disfigured discolored nails with subungual debris noted bilateral entire nail hallux through fifth toenails Ulcer Exam: There is no evidence of ulcer or pre-ulcerative changes or infection. Orthopedic Exam: Muscle tone and strength are WNL. No limitations in general ROM. No crepitus or effusions noted. Foot type and digits show no abnormalities. Bony prominences are unremarkable. Skin: No Porokeratosis. No infection or ulcers  Diagnosis:  Onychomycosis, , Pain in right toe, pain in left toes  Treatment & Plan Procedures and Treatment: Consent by patient was obtained for treatment procedures.   Debridement of mycotic and hypertrophic toenails, 1 through 5 bilateral and clearing of subungual debris. No ulceration, no infection noted.  Return Visit-Office Procedure: Patient instructed to return to the office for a follow up visit 3 months for continued evaluation and treatment.    Ade Stmarie DPM 

## 2020-04-15 ENCOUNTER — Other Ambulatory Visit: Payer: Self-pay | Admitting: Internal Medicine

## 2020-04-16 LAB — CBC
HCT: 35.5 % (ref 35.0–45.0)
Hemoglobin: 12.2 g/dL (ref 11.7–15.5)
MCH: 29.5 pg (ref 27.0–33.0)
MCHC: 34.4 g/dL (ref 32.0–36.0)
MCV: 85.7 fL (ref 80.0–100.0)
MPV: 11.8 fL (ref 7.5–12.5)
Platelets: 178 10*3/uL (ref 140–400)
RBC: 4.14 10*6/uL (ref 3.80–5.10)
RDW: 15.1 % — ABNORMAL HIGH (ref 11.0–15.0)
WBC: 6.2 10*3/uL (ref 3.8–10.8)

## 2020-04-16 LAB — COMPLETE METABOLIC PANEL WITH GFR
AG Ratio: 1.4 (calc) (ref 1.0–2.5)
ALT: 23 U/L (ref 6–29)
AST: 22 U/L (ref 10–35)
Albumin: 4.4 g/dL (ref 3.6–5.1)
Alkaline phosphatase (APISO): 72 U/L (ref 37–153)
BUN/Creatinine Ratio: 15 (calc) (ref 6–22)
BUN: 17 mg/dL (ref 7–25)
CO2: 22 mmol/L (ref 20–32)
Calcium: 9.6 mg/dL (ref 8.6–10.4)
Chloride: 107 mmol/L (ref 98–110)
Creat: 1.11 mg/dL — ABNORMAL HIGH (ref 0.50–0.99)
GFR, Est African American: 62 mL/min/{1.73_m2} (ref >=60)
GFR, Est Non African American: 54 mL/min/{1.73_m2} — ABNORMAL LOW (ref >=60)
Globulin: 3.1 g/dL (calc) (ref 1.9–3.7)
Glucose, Bld: 149 mg/dL — ABNORMAL HIGH (ref 65–99)
Potassium: 4.4 mmol/L (ref 3.5–5.3)
Sodium: 142 mmol/L (ref 135–146)
Total Bilirubin: 0.5 mg/dL (ref 0.2–1.2)
Total Protein: 7.5 g/dL (ref 6.1–8.1)

## 2020-04-16 LAB — LIPID PANEL
Cholesterol: 128 mg/dL (ref <200)
HDL: 43 mg/dL — ABNORMAL LOW (ref >=50)
LDL Cholesterol (Calc): 50 mg/dL (calc)
Non-HDL Cholesterol (Calc): 85 mg/dL (calc) (ref <130)
Total CHOL/HDL Ratio: 3 (calc) (ref <5.0)
Triglycerides: 330 mg/dL — ABNORMAL HIGH (ref <150)

## 2020-04-16 LAB — VITAMIN D 25 HYDROXY (VIT D DEFICIENCY, FRACTURES): Vit D, 25-Hydroxy: 60 ng/mL (ref 30–100)

## 2020-04-16 LAB — TSH: TSH: 2.3 mIU/L (ref 0.40–4.50)

## 2020-05-04 ENCOUNTER — Ambulatory Visit (INDEPENDENT_AMBULATORY_CARE_PROVIDER_SITE_OTHER): Payer: Medicare Other | Admitting: Ophthalmology

## 2020-05-04 ENCOUNTER — Encounter (INDEPENDENT_AMBULATORY_CARE_PROVIDER_SITE_OTHER): Payer: Self-pay | Admitting: Ophthalmology

## 2020-05-04 ENCOUNTER — Other Ambulatory Visit: Payer: Self-pay

## 2020-05-04 DIAGNOSIS — H43812 Vitreous degeneration, left eye: Secondary | ICD-10-CM

## 2020-05-04 DIAGNOSIS — E113493 Type 2 diabetes mellitus with severe nonproliferative diabetic retinopathy without macular edema, bilateral: Secondary | ICD-10-CM | POA: Diagnosis not present

## 2020-05-04 DIAGNOSIS — H34831 Tributary (branch) retinal vein occlusion, right eye, with macular edema: Secondary | ICD-10-CM

## 2020-05-04 DIAGNOSIS — H26491 Other secondary cataract, right eye: Secondary | ICD-10-CM

## 2020-05-04 MED ORDER — BEVACIZUMAB 2.5 MG/0.1ML IZ SOSY
2.5000 mg | PREFILLED_SYRINGE | INTRAVITREAL | Status: AC | PRN
  Administered 2020-05-04: 2.5 mg via INTRAVITREAL

## 2020-05-04 NOTE — Assessment & Plan Note (Signed)
Do not pathologic

## 2020-05-04 NOTE — Assessment & Plan Note (Signed)
Improved post capsulotomy

## 2020-05-04 NOTE — Assessment & Plan Note (Signed)
At 3 months follow-up with recurrent CME and vision loss symptomatic and on my chart from CME of BRVO superotemporal.  Patient had illness preventing return prior to this time.  OD will need intravitreal Avastin today and follow-up in 6 weeks

## 2020-05-04 NOTE — Progress Notes (Signed)
05/04/2020     CHIEF COMPLAINT Patient presents for Retina Follow Up (3 Month F/U OU, poss Avastin OD//Pt denies noticeable changes to [place] OU since last visit. Pt denies ocular pain, flashes of light, or floaters OU. //)   HISTORY OF PRESENT ILLNESS: Lacey Meyer is a 60 y.o. female who presents to the clinic today for:   HPI    Retina Follow Up    Patient presents with  CRVO/BRVO.  In right eye.  This started 3 months ago.  Severity is mild.  Duration of 3 months.  Since onset it is stable. Additional comments: 3 Month F/U OU, poss Avastin OD  Pt denies noticeable changes to [place] OU since last visit. Pt denies ocular pain, flashes of light, or floaters OU.          Last edited by Rockie Neighbours, [place] on 05/04/2020 10:00 AM. (History)      Referring physician: Nolene Ebbs, MD [place],  [place] [address]  HISTORICAL INFORMATION:   Selected notes from the MEDICAL RECORD NUMBER    Lab Results  Component Value Date   HGBA1C 10.1 (H) 11/07/2018     CURRENT MEDICATIONS: Current Outpatient Medications (Ophthalmic Drugs)  Medication Sig  . Olopatadine HCl 0.2 % SOLN INSTILL 1 DROP IN [facility] EYE DAILY AS NEEDED   No current facility-administered medications for this visit. (Ophthalmic Drugs)   Current Outpatient Medications (Other)  Medication Sig  . ACCU-CHEK GUIDE test strip USE TO TEST THREE TIMES DAILY (E11.65)  . allopurinol (ZYLOPRIM) 300 MG tablet Take by mouth.  . ALPRAZolam (XANAX) 0.25 MG tablet Take 0.25 mg by mouth daily as needed for anxiety.  . B-D UF III MINI PEN NEEDLES 31G X 5 MM MISC   . benazepril (LOTENSIN) 10 MG tablet Take 10 mg by mouth daily.  . cephALEXin (KEFLEX) 500 MG capsule   . colchicine 0.6 MG tablet Take by mouth.  . diclofenac (VOLTAREN) 75 MG EC tablet Take by mouth.  . diclofenac Sodium (VOLTAREN) 1 % GEL   . diphenoxylate-atropine (LOMOTIL) 2.5-0.025 MG tablet TAKE 1 TABLET BY MOUTH EVERY 6 HOURS AS NEEDED FOR  DIARRHEA  . DULoxetine (CYMBALTA) 60 MG capsule Take 60 mg by mouth daily.   . ferrous sulfate 325 (65 FE) MG tablet Take by mouth.  . fluticasone (FLONASE) 50 MCG/ACT nasal spray USE 2 SPRAY(S) IN EACH NOSTRIL DAILY  . furosemide (LASIX) 40 MG tablet Take 1 tablet (40 mg total) by mouth daily.  Marland Kitchen glimepiride (AMARYL) 4 MG tablet Take 4 mg by mouth 2 (two) times daily.   . Lancets (ONETOUCH DELICA PLUS [id number]) MISC CHECK GLUCOSE 3 TIMES A DAY  . LYRICA 150 MG capsule   . LYRICA 200 MG capsule Take 200 mg by mouth 4 (four) times daily.   . meloxicam (MOBIC) 15 MG tablet TAKE 1 TABLET BY MOUTH ONCE DAILY FOR 30 DAYS  . metFORMIN (GLUCOPHAGE) 500 MG tablet Take 500 mg by mouth 2 (two) times daily.   . methylPREDNISolone (MEDROL DOSEPAK) 4 MG TBPK tablet   . metoprolol succinate (TOPROL-XL) 50 MG 24 hr tablet Take 50 mg by mouth daily.   . Multiple Vitamins-Minerals (BARIATRIC FUSION PO) Take 1 tablet by mouth daily.  . naloxone (NARCAN) 4 MG/0.1ML LIQD nasal spray kit PLEASE SEE ATTACHED FOR DETAILED DIRECTIONS  . nortriptyline (PAMELOR) 50 MG capsule TAKE 1 CAPSULE BY MOUTH DAILY AT BEDTIME  . NOVOLOG FLEXPEN 100 UNIT/ML FlexPen Inject 2-15 Units into  the skin 3 (three) times daily with meals.   Marland Kitchen omeprazole (PRILOSEC) 20 MG capsule Take 1 capsule (20 mg total) by mouth daily.  . ondansetron (ZOFRAN-ODT) 4 MG disintegrating tablet DISSOLVE 1 TABLET IN MOUTH EVERY 8 HOURS AS NEEDED FOR UP TO 7 DAYS FOR NAUSEA OR VOMITING  . Oxycodone HCl 10 MG TABS Take 10 mg by mouth every 8 (eight) hours as needed.  . pantoprazole (PROTONIX) 40 MG tablet   . simvastatin (ZOCOR) 40 MG tablet   . spironolactone (ALDACTONE) 25 MG tablet Take by mouth.  Marland Kitchen tiZANidine (ZANAFLEX) 2 MG tablet TAKE 1 TABLET BY MOUTH AT BEDTIME AS NEEDED FOR PAINS  . triamcinolone cream (KENALOG) 0.5 % APPLY CREAM TOPICALLY TO LEGS TWICE A DAY AS NEEDED  . venlafaxine XR (EFFEXOR-XR) 150 MG 24 hr capsule Take 150 mg by mouth daily  with breakfast.  . VENTOLIN HFA 108 (90 Base) MCG/ACT inhaler   . Vitamin D, Ergocalciferol, (DRISDOL) 1.25 MG (50000 UNIT) CAPS capsule   . XULTOPHY 100-3.6 UNIT-MG/ML SOPN INJECT 20 UNITS SUBCUTANEOUSLY ONCE DAILY IN THE MORNING  . zolpidem (AMBIEN) 10 MG tablet Take 10 mg by mouth at bedtime as needed.   No current facility-administered medications for this visit. (Other)      REVIEW OF SYSTEMS:    ALLERGIES Allergies  Allergen Reactions  . Ampicillin Anaphylaxis, Hives and Other (See Comments)    Has patient had a PCN reaction causing immediate rash, facial/tongue/throat swelling, SOB or lightheadedness with hypotension: Yes Has patient had a PCN reaction causing severe rash involving mucus membranes or skin necrosis: Yes Has patient had a PCN reaction that required hospitalization Yes Has patient had a PCN reaction occurring within the last 10 years: No If all of the above answers are "NO", then may proceed with Cephalosporin use.   Marland Kitchen Penicillins Anaphylaxis, Hives and Nausea And Vomiting    Has patient had a PCN reaction causing immediate rash, facial/tongue/throat swelling, SOB or lightheadedness with hypotension: Yes Has patient had a PCN reaction causing severe rash involving mucus membranes or skin necrosis: Yes Has patient had a PCN reaction that required hospitalization Yes Has patient had a PCN reaction occurring within the last 10 years: No If all of the above answers are "NO", then may proceed with Cephalosporin use. Has patient had a PCN reaction causing immediate rash, facial/tongue/throat swelling, SOB or lightheadedness with hypotension: Yes Has patient had a PCN reaction causing severe rash involving mucus membranes or skin necrosis: Yes Has patient had a PCN reaction that required hospitalization Yes Has patient had a PCN reaction occurring within the last 10 years: No If all of the above answers are "NO", then may proceed with Cephalosporin use.  Marland Kitchen  Doxycycline Other (See Comments)    unknown unknown unknown  . Other Swelling, Rash and Other (See Comments)    Blistex Blistex Blistex    PAST MEDICAL HISTORY Past Medical History:  Diagnosis Date  . Anemia   . Arthritis   . Diabetes mellitus without complication ([place])   . GERD (gastroesophageal reflux disease)   . Gout   . History of blood transfusion   . HTN (hypertension)   . Impaired vision   . 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 BTL    . EYE SURGERY     cataract surgery / right eye   . FOOT SURGERY     bilateral   . LAPAROSCOPIC GASTRIC SLEEVE  RESECTION N/A 01/03/2016   Procedure: LAPAROSCOPIC GASTRIC SLEEVE RESECTION WITH UPPER ENDO;  Surgeon: Alphonsa Overall, MD;  Location: [facility]S;  Service: General;  Laterality: N/A;  . SHOULDER ARTHROSCOPY W/ ROTATOR CUFF REPAIR     left   . TUBAL LIGATION      FAMILY HISTORY Family History  Problem Relation Age of Onset  . Hypertension Mother   . Hypertension Father     SOCIAL HISTORY Social History   Tobacco Use  . Smoking status: Former Smoker    Packs/day: 0.25    Years: 1.00    Pack years: 0.25    Types: Cigarettes    Quit date: 03/06/2010    Years since quitting: 10.1  . Smokeless tobacco: Never Used  Substance Use Topics  . Alcohol use: No  . Drug use: No         OPHTHALMIC EXAM:  Base Eye Exam    Visual Acuity (ETDRS)      Right Left   Dist [place] 20/100 -2 20/100 -2   Dist ph [place] NI NI       Tonometry (Tonopen, 10:00 AM)      Right Left   Pressure 14 16       Pupils      Pupils Dark Light Shape React APD   Right PERRL 5 4 Round Slow None   Left PERRL 5 4 Round Slow None       Visual Fields (Counting fingers)      Left Right    Full Full       Neuro/Psych    Oriented x3: Yes   Mood/Affect: Normal       Dilation    Both eyes: 1.0% Mydriacyl, 2.5% Phenylephrine @ 10:04 AM        Slit Lamp and Fundus Exam     External Exam      Right Left   External Normal Normal       Slit Lamp Exam      Right Left   Lids/Lashes Normal Normal   Conjunctiva/Sclera White and quiet White and quiet   Cornea Clear Clear   Anterior Chamber Deep and quiet Deep and quiet   Iris Round and reactive Round and reactive   Lens Posterior chamber intraocular lens, 2+ Posterior capsular opacification centrally  Posterior chamber intraocular lens, Centered posterior chamber intraocular lens   Anterior Vitreous Normal Normal       Fundus Exam      Right Left   Posterior Vitreous ,, Posterior vitreous detachment ,, Posterior vitreous detachment   Disc Normal Normal   C/D Ratio 0.3 0.4   Macula Retinal pigment epithelial mottling Retinal pigment epithelial mottling   Vessels Old BRVO  collateralization along the superotemporal margin of the nerve NPDR- Moderate   Periphery Normal, good  prp Normal          IMAGING AND PROCEDURES  Imaging and Procedures for 05/04/20  OCT, Retina - OU - Both Eyes       Right Eye Quality was good. Scan locations included subfoveal. Central Foveal Thickness: 258. Progression has worsened. Findings include vitreomacular adhesion , abnormal foveal contour, cystoid macular edema.   Left Eye Quality was good. Scan locations included subfoveal. Central Foveal Thickness: 180. Findings include vitreomacular adhesion .   Notes Active CME OD from a history of branch retinal vein occlusion, worse today with recurrence of CME is center involved and superotemporal, currently at  3 months follow-up.  Intravitreal Injection, Pharmacologic Agent - OD - Right Eye       Time Out 05/04/2020. 11:18 AM. Confirmed correct patient, procedure, site, and patient consented.   Anesthesia Topical anesthesia was used. Anesthetic medications included [place] 3.5%.   Procedure Preparation included 5% betadine to ocular surface, 10% betadine to eyelids, Tobramycin 0.3%, Ofloxacin . A supplied  needle was used.   Injection:  2.5 mg Bevacizumab (AVASTIN) 2.36m/0.1mL SOSY   NDC:: 02774-128-78 Lot:: [id number]  Route: Intravitreal, Site: Right Eye  Post-op Post injection exam found visual acuity of at least counting fingers. The patient tolerated the procedure well. There were no complications. The patient received written and verbal post procedure care education. Post injection medications were not given.                 ASSESSMENT/PLAN:  Branch retinal vein occlusion with macular edema of right eye At 3 months follow-up with recurrent CME and vision loss symptomatic and on my chart from CME of BRVO superotemporal.  Patient had illness preventing return prior to this time.  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 Branch retinal vein occlusion with macular edema of right eye  H34.8310 OCT, Retina - OU - Both Eyes    Intravitreal Injection, Pharmacologic Agent - OD - Right Eye    bevacizumab (AVASTIN) SOSY 2.5 mg  2. Right posterior capsular opacification  H26.491   3. 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 Posterior vitreous detachment, left eye  H43.812     1.  Recurrence of branch retinal vein occlusion with CME OD at 321-monthollow-up.  We will need to shorten interval follow-up to control the CME and protect visual acuity decline OD  2.  No active disease OS, will continue to observe 3.  Ophthalmic Meds Ordered this visit:  Meds ordered this encounter  Medications  . bevacizumab (AVASTIN) SOSY 2.5 mg       Return in about 6 weeks (around 06/15/2020) for  dilate, OD, AVASTIN OCT.  There are no Patient Instructions on file for this visit.   Explained the diagnoses, plan, and follow up with the patient and they expressed understanding.  Patient expressed understanding of the importance of proper follow up care.   GaClent Demarkankin M.D. Diseases & Surgery of the Retina and Vitreous Retina & Diabetic EyAlhambra2/23/22     Abbreviations: M myopia (nearsighted); A astigmatism; H hyperopia (farsighted); P presbyopia; Mrx spectacle prescription;  CTL contact lenses; OD right eye; OS left eye; OU both eyes  XT exotropia; ET esotropia; PEK punctate epithelial keratitis; PEE punctate epithelial erosions; DES dry eye syndrome; MGD meibomian gland dysfunction; ATs artificial tears; PFAT's preservative free artificial tears; NSFultonuclear sclerotic cataract; PSC posterior subcapsular cataract; ERM epi-retinal membrane; PVD posterior vitreous detachment; RD retinal detachment; DM diabetes mellitus; DR diabetic retinopathy; NPDR non-proliferative diabetic retinopathy; PDR proliferative diabetic retinopathy; CSME clinically significant macular edema; DME diabetic macular edema; dbh dot blot hemorrhages; CWS cotton wool spot; POAG primary open angle glaucoma; C/D cup-to-disc ratio; HVF humphrey visual field; GVF goldmann visual field; OCT optical coherence tomography; IOP intraocular pressure; BRVO  Branch retinal vein occlusion; CRVO central retinal vein occlusion; CRAO central retinal artery occlusion; BRAO branch retinal artery occlusion; RT retinal tear; SB scleral buckle; PPV pars plana vitrectomy; VH Vitreous hemorrhage; PRP panretinal laser photocoagulation; IVK intravitreal kenalog; VMT vitreomacular traction; MH Macular hole;  NVD neovascularization of the disc; NVE neovascularization elsewhere; AREDS age related eye disease study; ARMD age related macular degeneration; POAG primary open angle glaucoma; EBMD epithelial/anterior basement membrane dystrophy; ACIOL  anterior chamber intraocular lens; IOL intraocular lens; PCIOL posterior chamber intraocular lens; Phaco/IOL phacoemulsification with intraocular lens placement; [place] photorefractive keratectomy; LASIK laser assisted in situ keratomileusis; HTN hypertension; DM diabetes mellitus; COPD chronic obstructive pulmonary disease

## 2020-05-04 NOTE — Assessment & Plan Note (Signed)
Stable OU 

## 2020-05-10 ENCOUNTER — Encounter (INDEPENDENT_AMBULATORY_CARE_PROVIDER_SITE_OTHER): Payer: Self-pay

## 2020-05-10 LAB — HM DIABETES EYE EXAM

## 2020-06-15 ENCOUNTER — Other Ambulatory Visit: Payer: Self-pay

## 2020-06-15 ENCOUNTER — Ambulatory Visit (INDEPENDENT_AMBULATORY_CARE_PROVIDER_SITE_OTHER): Payer: Medicare Other | Admitting: Ophthalmology

## 2020-06-15 ENCOUNTER — Encounter (INDEPENDENT_AMBULATORY_CARE_PROVIDER_SITE_OTHER): Payer: Self-pay | Admitting: Ophthalmology

## 2020-06-15 DIAGNOSIS — H34831 Tributary (branch) retinal vein occlusion, right eye, with macular edema: Secondary | ICD-10-CM

## 2020-06-15 DIAGNOSIS — H43821 Vitreomacular adhesion, right eye: Secondary | ICD-10-CM

## 2020-06-15 MED ORDER — BEVACIZUMAB 2.5 MG/0.1ML IZ SOSY
2.5000 mg | PREFILLED_SYRINGE | INTRAVITREAL | Status: AC | PRN
  Administered 2020-06-15: 2.5 mg via INTRAVITREAL

## 2020-06-15 NOTE — Assessment & Plan Note (Signed)
Much improved 6 weeks post injection Avastin will repeat injection today and monitor response again in 6 to 7 weeks

## 2020-06-15 NOTE — Progress Notes (Signed)
06/15/2020     CHIEF COMPLAINT Patient presents for Retina Follow Up (6 Wk F/U OD, poss Avastin OD//Pt denies noticeable changes to [place] OU since last visit. Pt denies ocular pain, flashes of light, or floaters OU. //)   HISTORY OF PRESENT ILLNESS: Lacey Meyer is a 60 y.o. female who presents to the clinic today for:   HPI    Retina Follow Up    Patient presents with  CRVO/BRVO.  In right eye.  This started 6 weeks ago.  Severity is moderate.  Duration of 6 weeks.  Since onset it is stable. Additional comments: 6 Wk F/U OD, poss Avastin OD  Pt denies noticeable changes to [place] OU since last visit. Pt denies ocular pain, flashes of light, or floaters OU.          Last edited by Rockie Neighbours, [place] on 06/15/2020  9:34 AM. (History)      Referring physician: Nolene Ebbs, MD [place],  [place] [address]  HISTORICAL INFORMATION:   Selected notes from the MEDICAL RECORD NUMBER    Lab Results  Component Value Date   HGBA1C 10.1 (H) 11/07/2018     CURRENT MEDICATIONS: Current Outpatient Medications (Ophthalmic Drugs)  Medication Sig  . Olopatadine HCl 0.2 % SOLN INSTILL 1 DROP IN [facility] EYE DAILY AS NEEDED   No current facility-administered medications for this visit. (Ophthalmic Drugs)   Current Outpatient Medications (Other)  Medication Sig  . ACCU-CHEK GUIDE test strip USE TO TEST THREE TIMES DAILY (E11.65)  . allopurinol (ZYLOPRIM) 300 MG tablet Take by mouth.  . ALPRAZolam (XANAX) 0.25 MG tablet Take 0.25 mg by mouth daily as needed for anxiety.  . B-D UF III MINI PEN NEEDLES 31G X 5 MM MISC   . benazepril (LOTENSIN) 10 MG tablet Take 10 mg by mouth daily.  . cephALEXin (KEFLEX) 500 MG capsule   . colchicine 0.6 MG tablet Take by mouth.  . diclofenac (VOLTAREN) 75 MG EC tablet Take by mouth.  . diclofenac Sodium (VOLTAREN) 1 % GEL   . diphenoxylate-atropine (LOMOTIL) 2.5-0.025 MG tablet TAKE 1 TABLET BY MOUTH EVERY 6 HOURS AS NEEDED FOR DIARRHEA  .  DULoxetine (CYMBALTA) 60 MG capsule Take 60 mg by mouth daily.   . ferrous sulfate 325 (65 FE) MG tablet Take by mouth.  . fluticasone (FLONASE) 50 MCG/ACT nasal spray USE 2 SPRAY(S) IN EACH NOSTRIL DAILY  . furosemide (LASIX) 40 MG tablet Take 1 tablet (40 mg total) by mouth daily.  Marland Kitchen glimepiride (AMARYL) 4 MG tablet Take 4 mg by mouth 2 (two) times daily.   . Lancets (ONETOUCH DELICA PLUS [id number]) MISC CHECK GLUCOSE 3 TIMES A DAY  . LYRICA 150 MG capsule   . LYRICA 200 MG capsule Take 200 mg by mouth 4 (four) times daily.   . meloxicam (MOBIC) 15 MG tablet TAKE 1 TABLET BY MOUTH ONCE DAILY FOR 30 DAYS  . metFORMIN (GLUCOPHAGE) 500 MG tablet Take 500 mg by mouth 2 (two) times daily.   . methylPREDNISolone (MEDROL DOSEPAK) 4 MG TBPK tablet   . metoprolol succinate (TOPROL-XL) 50 MG 24 hr tablet Take 50 mg by mouth daily.   . Multiple Vitamins-Minerals (BARIATRIC FUSION PO) Take 1 tablet by mouth daily.  . naloxone (NARCAN) 4 MG/0.1ML LIQD nasal spray kit PLEASE SEE ATTACHED FOR DETAILED DIRECTIONS  . nortriptyline (PAMELOR) 50 MG capsule TAKE 1 CAPSULE BY MOUTH DAILY AT BEDTIME  . NOVOLOG FLEXPEN 100 UNIT/ML FlexPen Inject 2-15 Units  into the skin 3 (three) times daily with meals.   Marland Kitchen omeprazole (PRILOSEC) 20 MG capsule Take 1 capsule (20 mg total) by mouth daily.  . ondansetron (ZOFRAN-ODT) 4 MG disintegrating tablet DISSOLVE 1 TABLET IN MOUTH EVERY 8 HOURS AS NEEDED FOR UP TO 7 DAYS FOR NAUSEA OR VOMITING  . Oxycodone HCl 10 MG TABS Take 10 mg by mouth every 8 (eight) hours as needed.  . pantoprazole (PROTONIX) 40 MG tablet   . simvastatin (ZOCOR) 40 MG tablet   . spironolactone (ALDACTONE) 25 MG tablet Take by mouth.  Marland Kitchen tiZANidine (ZANAFLEX) 2 MG tablet TAKE 1 TABLET BY MOUTH AT BEDTIME AS NEEDED FOR PAINS  . triamcinolone cream (KENALOG) 0.5 % APPLY CREAM TOPICALLY TO LEGS TWICE A DAY AS NEEDED  . venlafaxine XR (EFFEXOR-XR) 150 MG 24 hr capsule Take 150 mg by mouth daily with  breakfast.  . VENTOLIN HFA 108 (90 Base) MCG/ACT inhaler   . Vitamin D, Ergocalciferol, (DRISDOL) 1.25 MG (50000 UNIT) CAPS capsule   . XULTOPHY 100-3.6 UNIT-MG/ML SOPN INJECT 20 UNITS SUBCUTANEOUSLY ONCE DAILY IN THE MORNING  . zolpidem (AMBIEN) 10 MG tablet Take 10 mg by mouth at bedtime as needed.   No current facility-administered medications for this visit. (Other)      REVIEW OF SYSTEMS:    ALLERGIES Allergies  Allergen Reactions  . Ampicillin Anaphylaxis, Hives and Other (See Comments)    Has patient had a PCN reaction causing immediate rash, facial/tongue/throat swelling, SOB or lightheadedness with hypotension: Yes Has patient had a PCN reaction causing severe rash involving mucus membranes or skin necrosis: Yes Has patient had a PCN reaction that required hospitalization Yes Has patient had a PCN reaction occurring within the last 10 years: No If all of the above answers are "NO", then may proceed with Cephalosporin use.   Marland Kitchen Penicillins Anaphylaxis, Hives and Nausea And Vomiting    Has patient had a PCN reaction causing immediate rash, facial/tongue/throat swelling, SOB or lightheadedness with hypotension: Yes Has patient had a PCN reaction causing severe rash involving mucus membranes or skin necrosis: Yes Has patient had a PCN reaction that required hospitalization Yes Has patient had a PCN reaction occurring within the last 10 years: No If all of the above answers are "NO", then may proceed with Cephalosporin use. Has patient had a PCN reaction causing immediate rash, facial/tongue/throat swelling, SOB or lightheadedness with hypotension: Yes Has patient had a PCN reaction causing severe rash involving mucus membranes or skin necrosis: Yes Has patient had a PCN reaction that required hospitalization Yes Has patient had a PCN reaction occurring within the last 10 years: No If all of the above answers are "NO", then may proceed with Cephalosporin use.  Marland Kitchen Doxycycline  Other (See Comments)    unknown unknown unknown  . Other Swelling, Rash and Other (See Comments)    Blistex Blistex Blistex    PAST MEDICAL HISTORY Past Medical History:  Diagnosis Date  . Anemia   . Arthritis   . Diabetes mellitus without complication ([place])   . GERD (gastroesophageal reflux disease)   . Gout   . History of blood transfusion   . HTN (hypertension)   . Impaired vision   . 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 BTL    . EYE SURGERY     cataract surgery / right eye   . FOOT SURGERY     bilateral   . LAPAROSCOPIC GASTRIC  SLEEVE RESECTION N/A 01/03/2016   Procedure: LAPAROSCOPIC GASTRIC SLEEVE RESECTION WITH UPPER ENDO;  Surgeon: Alphonsa Overall, MD;  Location: [facility] ORS;  Service: General;  Laterality: N/A;  . SHOULDER ARTHROSCOPY W/ ROTATOR CUFF REPAIR     left   . TUBAL LIGATION      FAMILY HISTORY Family History  Problem Relation Age of Onset  . Hypertension Mother   . Hypertension Father     SOCIAL HISTORY Social History   Tobacco Use  . Smoking status: Former Smoker    Packs/day: 0.25    Years: 1.00    Pack years: 0.25    Types: Cigarettes    Quit date: 03/06/2010    Years since quitting: 10.2  . Smokeless tobacco: Never Used  Substance Use Topics  . Alcohol use: No  . Drug use: No         OPHTHALMIC EXAM:  Base Eye Exam    Visual Acuity (ETDRS)      Right Left   Dist [place] 20/100 -2 20/100 -1   Dist ph [place] NI 20/100 +1       Tonometry (Tonopen, 9:34 AM)      Right Left   Pressure 17 18       Pupils      Pupils Dark Light Shape React APD   Right PERRL 6 5 Round Slow None   Left PERRL 6 5 Round Slow None       Visual Fields (Counting fingers)      Left Right    Full Full       Extraocular Movement      Right Left    Full Full       Neuro/Psych    Oriented x3: Yes   Mood/Affect: Normal       Dilation    Right eye: 1.0% Mydriacyl, 2.5% Phenylephrine @  9:38 AM        Slit Lamp and Fundus Exam    External Exam      Right Left   External Normal Normal       Slit Lamp Exam      Right Left   Lids/Lashes Normal Normal   Conjunctiva/Sclera White and quiet White and quiet   Cornea Clear Clear   Anterior Chamber Deep and quiet Deep and quiet   Iris Round and reactive Round and reactive   Lens Posterior chamber intraocular lens, 2+ Posterior capsular opacification centrally  Posterior chamber intraocular lens, Centered posterior chamber intraocular lens   Anterior Vitreous Normal Normal       Fundus Exam      Right Left   Posterior Vitreous ,, Posterior vitreous detachment    Disc Normal    C/D Ratio 0.3    Macula Retinal pigment epithelial mottling, no CME clinically    Vessels Old BRVO  collateralization along the superotemporal margin of the nerve    Periphery Normal, good  prp           IMAGING AND PROCEDURES  Imaging and Procedures for 06/15/20  OCT, Retina - OU - Both Eyes       Right Eye Quality was good. Scan locations included subfoveal. Central Foveal Thickness: 188. Progression has worsened. Findings include vitreomacular adhesion , abnormal foveal contour, cystoid macular edema.   Left Eye Quality was good. Scan locations included subfoveal. Central Foveal Thickness: 180. Findings include vitreomacular adhesion .   Notes Improved CME OD 6 weeks post injection for BRVO.  Will repeat injection today to  maintain  OS diffuse retinal atrophy no active maculopathy       Intravitreal Injection, Pharmacologic Agent - OD - Right Eye       Time Out 06/15/2020. 10:40 AM. Confirmed correct patient, procedure, site, and patient consented.   Anesthesia Topical anesthesia was used. Anesthetic medications included [place] 3.5%.   Procedure Preparation included 5% betadine to ocular surface, 10% betadine to eyelids, Tobramycin 0.3%, Ofloxacin . A supplied needle was used.   Injection:  2.5 mg Bevacizumab (AVASTIN)  2.$RemoveBefo'5mg'[id number]$ /0.58mL SOSY   NDC: 32202-542-70, Lot: [id number]   Route: Intravitreal, Site: Right Eye  Post-op Post injection exam found visual acuity of at least counting fingers. The patient tolerated the procedure well. There were no complications. The patient received written and verbal post procedure care education. Post injection medications were not given.                 ASSESSMENT/PLAN:  Branch retinal vein occlusion with macular edema of right eye Much improved 6 weeks post injection Avastin will repeat injection today and monitor response again in 6 to 7 weeks  Vitreomacular adhesion of right eye No pathologic distortion will observe      ICD-10-CM   1. Branch retinal vein occlusion with macular edema of right eye  H34.8310 OCT, Retina - OU - Both Eyes    Intravitreal Injection, Pharmacologic Agent - OD - Right Eye    bevacizumab (AVASTIN) SOSY 2.5 mg  2. Vitreomacular adhesion of right eye  H43.821     1.  Much improved CME secondary to BRVO 6 weeks post recent injection.  We will need to repeat injection today to maintain.  2.  3.  Ophthalmic Meds Ordered this visit:  Meds ordered this encounter  Medications  . bevacizumab (AVASTIN) SOSY 2.5 mg       Return for Return visit dilate OD 6 to 7 weeks, OD, AVASTIN OCT.  There are no Patient Instructions on file for this visit.   Explained the diagnoses, plan, and follow up with the patient and they expressed understanding.  Patient expressed understanding of the importance of proper follow up care.   Clent Demark Kandy Towery M.D. Diseases & Surgery of the Retina and Vitreous [facility] [place] 06/15/20     Abbreviations: M myopia (nearsighted); A astigmatism; H hyperopia (farsighted); P presbyopia; Mrx spectacle prescription;  CTL contact lenses; OD right eye; OS left eye; OU both eyes  XT exotropia; ET esotropia; PEK punctate epithelial keratitis; PEE punctate epithelial erosions; DES dry eye syndrome; MGD  meibomian gland dysfunction; ATs artificial tears; PFAT's preservative free artificial tears; [place] nuclear sclerotic cataract; PSC posterior subcapsular cataract; ERM epi-retinal membrane; PVD posterior vitreous detachment; RD retinal detachment; DM diabetes mellitus; DR diabetic retinopathy; NPDR non-proliferative diabetic retinopathy; PDR proliferative diabetic retinopathy; CSME clinically significant macular edema; DME diabetic macular edema; dbh dot blot hemorrhages; CWS cotton wool spot; POAG primary open angle glaucoma; C/D cup-to-disc ratio; HVF humphrey visual field; GVF goldmann visual field; OCT optical coherence tomography; IOP intraocular pressure; BRVO Branch retinal vein occlusion; CRVO central retinal vein occlusion; CRAO central retinal artery occlusion; BRAO branch retinal artery occlusion; RT retinal tear; SB scleral buckle; PPV pars plana vitrectomy; VH Vitreous hemorrhage; PRP panretinal laser photocoagulation; IVK intravitreal kenalog; VMT vitreomacular traction; MH Macular hole;  NVD neovascularization of the disc; NVE neovascularization elsewhere; AREDS age related eye disease study; ARMD age related macular degeneration; POAG primary open angle glaucoma; EBMD epithelial/anterior basement membrane dystrophy; ACIOL anterior chamber intraocular lens;  IOL intraocular lens; PCIOL posterior chamber intraocular lens; Phaco/IOL phacoemulsification with intraocular lens placement; [place] photorefractive keratectomy; LASIK laser assisted in situ keratomileusis; HTN hypertension; DM diabetes mellitus; COPD chronic obstructive pulmonary disease

## 2020-06-15 NOTE — Assessment & Plan Note (Signed)
No pathologic distortion will observe

## 2020-07-01 ENCOUNTER — Ambulatory Visit: Payer: Medicare Other | Admitting: Podiatry

## 2020-07-08 ENCOUNTER — Other Ambulatory Visit: Payer: Self-pay

## 2020-07-08 ENCOUNTER — Ambulatory Visit (INDEPENDENT_AMBULATORY_CARE_PROVIDER_SITE_OTHER): Payer: Medicare Other | Admitting: Podiatry

## 2020-07-08 DIAGNOSIS — Z794 Long term (current) use of insulin: Secondary | ICD-10-CM | POA: Diagnosis not present

## 2020-07-08 DIAGNOSIS — M79674 Pain in right toe(s): Secondary | ICD-10-CM

## 2020-07-08 DIAGNOSIS — E1140 Type 2 diabetes mellitus with diabetic neuropathy, unspecified: Secondary | ICD-10-CM

## 2020-07-08 DIAGNOSIS — B351 Tinea unguium: Secondary | ICD-10-CM

## 2020-07-08 DIAGNOSIS — M79675 Pain in left toe(s): Secondary | ICD-10-CM | POA: Diagnosis not present

## 2020-07-08 NOTE — Progress Notes (Signed)
Complaint:  Visit Type: Patient returns to my office for continued preventative foot care services. Complaint: Patient states" my nails have grown long and thick and become painful to walk and wear shoes" Patient has been diagnosed with DM with no foot complications. The patient presents for preventative foot care services. No changes to ROS.  Patient has had surgery with Dr.  Al Corpus.  Podiatric Exam: Vascular: dorsalis pedis and posterior tibial pulses are palpable bilateral. Capillary return is immediate. Temperature gradient is WNL. Skin turgor WNL  Sensorium: Diminished  Semmes Weinstein monofilament test. Normal tactile sensation bilaterally. Nail Exam: Pt has thick disfigured discolored nails with subungual debris noted bilateral entire nail hallux through fifth toenails Ulcer Exam: There is no evidence of ulcer or pre-ulcerative changes or infection. Orthopedic Exam: Muscle tone and strength are WNL. No limitations in general ROM. No crepitus or effusions noted. Foot type and digits show no abnormalities. Bony prominences are unremarkable. Skin: No Porokeratosis. No infection or ulcers  Diagnosis:  Onychomycosis, , Pain in right toe, pain in left toes  Treatment & Plan Procedures and Treatment: Consent by patient was obtained for treatment procedures.   Debridement of mycotic and hypertrophic toenails, 1 through 5 bilateral and clearing of subungual debris. No ulceration, no infection noted.  Return Visit-Office Procedure: Patient instructed to return to the office for a follow up visit 3 months for continued evaluation and treatment.    Helane Gunther DPM

## 2020-07-20 ENCOUNTER — Encounter (HOSPITAL_COMMUNITY): Payer: Self-pay | Admitting: *Deleted

## 2020-08-03 ENCOUNTER — Encounter (INDEPENDENT_AMBULATORY_CARE_PROVIDER_SITE_OTHER): Payer: Self-pay | Admitting: Ophthalmology

## 2020-08-03 ENCOUNTER — Other Ambulatory Visit: Payer: Self-pay

## 2020-08-03 ENCOUNTER — Ambulatory Visit (INDEPENDENT_AMBULATORY_CARE_PROVIDER_SITE_OTHER): Payer: Medicare Other | Admitting: Ophthalmology

## 2020-08-03 DIAGNOSIS — H34831 Tributary (branch) retinal vein occlusion, right eye, with macular edema: Secondary | ICD-10-CM | POA: Diagnosis not present

## 2020-08-03 MED ORDER — BEVACIZUMAB 2.5 MG/0.1ML IZ SOSY
2.5000 mg | PREFILLED_SYRINGE | INTRAVITREAL | Status: AC | PRN
  Administered 2020-08-03: 2.5 mg via INTRAVITREAL

## 2020-08-03 NOTE — Assessment & Plan Note (Signed)
Now 9 months status post restitution and reinstitution of therapy for massive CME secondary to branch retinal vein occlusion.  Currently at 7-week follow-up interval.  We will repeat injection today and examination again in 7 weeks

## 2020-08-03 NOTE — Progress Notes (Signed)
08/03/2020     CHIEF COMPLAINT Patient presents for Retina Follow Up (7 Wk F/U OD, poss Avastin OD//Pt denies noticeable changes to [place] OU since last visit. Pt denies ocular pain, flashes of light, or floaters OU. /LBS: 134 yesterday/)   HISTORY OF PRESENT ILLNESS: Lacey Meyer is a 60 y.o. female who presents to the clinic today for:   HPI    Retina Follow Up    Diagnosis: CRVO/BRVO   Laterality: right eye   Onset: 7 weeks ago   Severity: moderate   Duration: 7 weeks   Course: stable   Comments: 7 Wk F/U OD, poss Avastin OD  Pt denies noticeable changes to [place] OU since last visit. Pt denies ocular pain, flashes of light, or floaters OU.  LBS: 134 yesterday        Last edited by Rockie Neighbours, [place] on 08/03/2020 10:19 AM. (History)      Referring physician: Nolene Ebbs, MD [place],  [place] [id number]  HISTORICAL INFORMATION:   Selected notes from the MEDICAL RECORD NUMBER    Lab Results  Component Value Date   HGBA1C 10.1 (H) 11/07/2018     CURRENT MEDICATIONS: Current Outpatient Medications (Ophthalmic Drugs)  Medication Sig  . Olopatadine HCl 0.2 % SOLN INSTILL 1 DROP IN [facility] EYE DAILY AS NEEDED   No current facility-administered medications for this visit. (Ophthalmic Drugs)   Current Outpatient Medications (Other)  Medication Sig  . ACCU-CHEK GUIDE test strip USE TO TEST THREE TIMES DAILY (E11.65)  . allopurinol (ZYLOPRIM) 300 MG tablet Take by mouth.  . ALPRAZolam (XANAX) 0.25 MG tablet Take 0.25 mg by mouth daily as needed for anxiety.  . B-D UF III MINI PEN NEEDLES 31G X 5 MM MISC   . benazepril (LOTENSIN) 10 MG tablet Take 10 mg by mouth daily.  . cephALEXin (KEFLEX) 500 MG capsule   . colchicine 0.6 MG tablet Take by mouth.  . diclofenac (VOLTAREN) 75 MG EC tablet Take by mouth.  . diclofenac Sodium (VOLTAREN) 1 % GEL   . diphenoxylate-atropine (LOMOTIL) 2.5-0.025 MG tablet TAKE 1 TABLET BY MOUTH EVERY 6 HOURS AS NEEDED FOR  DIARRHEA  . DULoxetine (CYMBALTA) 60 MG capsule Take 60 mg by mouth daily.   . ferrous sulfate 325 (65 FE) MG tablet Take by mouth.  . fluticasone (FLONASE) 50 MCG/ACT nasal spray USE 2 SPRAY(S) IN EACH NOSTRIL DAILY  . furosemide (LASIX) 40 MG tablet Take 1 tablet (40 mg total) by mouth daily.  Marland Kitchen glimepiride (AMARYL) 4 MG tablet Take 4 mg by mouth 2 (two) times daily.   . Lancets (ONETOUCH DELICA PLUS [id number]) MISC CHECK GLUCOSE 3 TIMES A DAY  . LYRICA 150 MG capsule   . LYRICA 200 MG capsule Take 200 mg by mouth 4 (four) times daily.   . meloxicam (MOBIC) 15 MG tablet TAKE 1 TABLET BY MOUTH ONCE DAILY FOR 30 DAYS  . metFORMIN (GLUCOPHAGE) 500 MG tablet Take 500 mg by mouth 2 (two) times daily.   . methylPREDNISolone (MEDROL DOSEPAK) 4 MG TBPK tablet   . metoprolol succinate (TOPROL-XL) 50 MG 24 hr tablet Take 50 mg by mouth daily.   . Multiple Vitamins-Minerals (BARIATRIC FUSION PO) Take 1 tablet by mouth daily.  . naloxone (NARCAN) 4 MG/0.1ML LIQD nasal spray kit PLEASE SEE ATTACHED FOR DETAILED DIRECTIONS  . nortriptyline (PAMELOR) 50 MG capsule TAKE 1 CAPSULE BY MOUTH DAILY AT BEDTIME  . NOVOLOG FLEXPEN 100 UNIT/ML FlexPen Inject 2-15 Units  into the skin 3 (three) times daily with meals.   Marland Kitchen omeprazole (PRILOSEC) 20 MG capsule Take 1 capsule (20 mg total) by mouth daily.  . ondansetron (ZOFRAN-ODT) 4 MG disintegrating tablet DISSOLVE 1 TABLET IN MOUTH EVERY 8 HOURS AS NEEDED FOR UP TO 7 DAYS FOR NAUSEA OR VOMITING  . Oxycodone HCl 10 MG TABS Take 10 mg by mouth every 8 (eight) hours as needed.  . pantoprazole (PROTONIX) 40 MG tablet   . simvastatin (ZOCOR) 40 MG tablet   . spironolactone (ALDACTONE) 25 MG tablet Take by mouth.  Marland Kitchen tiZANidine (ZANAFLEX) 2 MG tablet TAKE 1 TABLET BY MOUTH AT BEDTIME AS NEEDED FOR PAINS  . triamcinolone cream (KENALOG) 0.5 % APPLY CREAM TOPICALLY TO LEGS TWICE A DAY AS NEEDED  . venlafaxine XR (EFFEXOR-XR) 150 MG 24 hr capsule Take 150 mg by mouth daily  with breakfast.  . VENTOLIN HFA 108 (90 Base) MCG/ACT inhaler   . Vitamin D, Ergocalciferol, (DRISDOL) 1.25 MG (50000 UNIT) CAPS capsule   . XULTOPHY 100-3.6 UNIT-MG/ML SOPN INJECT 20 UNITS SUBCUTANEOUSLY ONCE DAILY IN THE MORNING  . zolpidem (AMBIEN) 10 MG tablet Take 10 mg by mouth at bedtime as needed.   No current facility-administered medications for this visit. (Other)      REVIEW OF SYSTEMS:    ALLERGIES Allergies  Allergen Reactions  . Ampicillin Anaphylaxis, Hives and Other (See Comments)    Has patient had a PCN reaction causing immediate rash, facial/tongue/throat swelling, SOB or lightheadedness with hypotension: Yes Has patient had a PCN reaction causing severe rash involving mucus membranes or skin necrosis: Yes Has patient had a PCN reaction that required hospitalization Yes Has patient had a PCN reaction occurring within the last 10 years: No If all of the above answers are "NO", then may proceed with Cephalosporin use.   Marland Kitchen Penicillins Anaphylaxis, Hives and Nausea And Vomiting    Has patient had a PCN reaction causing immediate rash, facial/tongue/throat swelling, SOB or lightheadedness with hypotension: Yes Has patient had a PCN reaction causing severe rash involving mucus membranes or skin necrosis: Yes Has patient had a PCN reaction that required hospitalization Yes Has patient had a PCN reaction occurring within the last 10 years: No If all of the above answers are "NO", then may proceed with Cephalosporin use. Has patient had a PCN reaction causing immediate rash, facial/tongue/throat swelling, SOB or lightheadedness with hypotension: Yes Has patient had a PCN reaction causing severe rash involving mucus membranes or skin necrosis: Yes Has patient had a PCN reaction that required hospitalization Yes Has patient had a PCN reaction occurring within the last 10 years: No If all of the above answers are "NO", then may proceed with Cephalosporin use.  Marland Kitchen  Doxycycline Other (See Comments)    unknown unknown unknown  . Other Swelling, Rash and Other (See Comments)    Blistex Blistex Blistex    PAST MEDICAL HISTORY Past Medical History:  Diagnosis Date  . Anemia   . Arthritis   . Diabetes mellitus without complication ([place])   . GERD (gastroesophageal reflux disease)   . Gout   . History of blood transfusion   . HTN (hypertension)   . Impaired vision   . 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 BTL    . EYE SURGERY     cataract surgery / right eye   . FOOT SURGERY     bilateral   . LAPAROSCOPIC GASTRIC  SLEEVE RESECTION N/A 01/03/2016   Procedure: LAPAROSCOPIC GASTRIC SLEEVE RESECTION WITH UPPER ENDO;  Surgeon: Alphonsa Overall, MD;  Location: [facility]S;  Service: General;  Laterality: N/A;  . SHOULDER ARTHROSCOPY W/ ROTATOR CUFF REPAIR     left   . TUBAL LIGATION      FAMILY HISTORY Family History  Problem Relation Age of Onset  . Hypertension Mother   . Hypertension Father     SOCIAL HISTORY Social History   Tobacco Use  . Smoking status: Former Smoker    Packs/day: 0.25    Years: 1.00    Pack years: 0.25    Types: Cigarettes    Quit date: 03/06/2010    Years since quitting: 10.4  . Smokeless tobacco: Never Used  Substance Use Topics  . Alcohol use: No  . Drug use: No         OPHTHALMIC EXAM: Base Eye Exam    Visual Acuity (ETDRS)      Right Left   Dist [place] 20/200 20/200   Dist ph [place] 20/100 -2 20/100 -2       Tonometry (Tonopen, 10:23 AM)      Right Left   Pressure 13 17       Pupils      Pupils Dark Light Shape React APD   Right PERRL 6 5 Round Slow None   Left PERRL 6 5 Round Slow None       Visual Fields (Counting fingers)      Left Right    Full Full       Extraocular Movement      Right Left    Full Full       Neuro/Psych    Oriented x3: Yes   Mood/Affect: Normal       Dilation    Right eye: 1.0% Mydriacyl, 2.5%  Phenylephrine @ 10:23 AM        Slit Lamp and Fundus Exam    External Exam      Right Left   External Normal Normal       Slit Lamp Exam      Right Left   Lids/Lashes Normal Normal   Conjunctiva/Sclera White and quiet White and quiet   Cornea Clear Clear   Anterior Chamber Deep and quiet Deep and quiet   Iris Round and reactive Round and reactive   Lens Posterior chamber intraocular lens, 2+ Posterior capsular opacification centrally  Posterior chamber intraocular lens, Centered posterior chamber intraocular lens   Anterior Vitreous Normal Normal       Fundus Exam      Right Left   Posterior Vitreous ,, Posterior vitreous detachment    Disc Normal    C/D Ratio 0.3    Macula Retinal pigment epithelial mottling, no CME clinically    Vessels Old BRVO  collateralization along the superotemporal margin of the nerve    Periphery Normal, good  prp sup           IMAGING AND PROCEDURES  Imaging and Procedures for 08/03/20  OCT, Retina - OU - Both Eyes       Right Eye Quality was good. Scan locations included subfoveal. Central Foveal Thickness: 185. Progression has improved. Findings include vitreomacular adhesion , abnormal foveal contour, cystoid macular edema.   Left Eye Quality was good. Scan locations included subfoveal. Central Foveal Thickness: 179. Progression has been stable. Findings include vitreomacular adhesion .   Notes Improved CME OD 7 weeks post injection for BRVO.  Will repeat  injection today to maintain  OS diffuse retinal atrophy no active maculopathy       Intravitreal Injection, Pharmacologic Agent - OD - Right Eye       Time Out 08/03/2020. 11:26 AM. Confirmed correct patient, procedure, site, and patient consented.   Anesthesia Topical anesthesia was used. Anesthetic medications included [place] 3.5%.   Procedure Preparation included 5% betadine to ocular surface, 10% betadine to eyelids, Tobramycin 0.3%, Ofloxacin . A supplied needle was  used.   Injection:  2.5 mg Bevacizumab (AVASTIN) 2.31m/0.1mL SOSY   NDC:: 81103-159-45 Lot:: [id number]  Route: Intravitreal, Site: Right Eye  Post-op Post injection exam found visual acuity of at least counting fingers. The patient tolerated the procedure well. There were no complications. The patient received written and verbal post procedure care education. Post injection medications were not given.                 ASSESSMENT/PLAN:  Branch retinal vein occlusion with macular edema of right eye Now 9 months status post restitution and reinstitution of therapy for massive CME secondary to branch retinal vein occlusion.  Currently at 7-week follow-up interval.  We will repeat injection today and examination again in 7 weeks      ICD-10-CM   1. Branch retinal vein occlusion with macular edema of right eye  H34.8310 OCT, Retina - OU - Both Eyes    Intravitreal Injection, Pharmacologic Agent - OD - Right Eye    bevacizumab (AVASTIN) SOSY 2.5 mg    1.  Vastly improved CME secondary to branch retinal vein occlusion now since reinstitution of therapy on a consistent basis for branch retinal vein occlusion worsening August 2021.  Currently 7-week follow-up and CME has remained resolved OD.  Repeat injection today and examination next in 7 weeks  2.  Dilate OU next  3.  Ophthalmic Meds Ordered this visit:  Meds ordered this encounter  Medications  . bevacizumab (AVASTIN) SOSY 2.5 mg       Return in about 7 weeks (around 09/21/2020) for DILATE OU, AVASTIN OCT, OD.  There are no Patient Instructions on file for this visit.   Explained the diagnoses, plan, and follow up with the patient and they expressed understanding.  Patient expressed understanding of the importance of proper follow up care.   GClent DemarkRankin M.D. Diseases & Surgery of the Retina and Vitreous [facility] [place]05/25/22     Abbreviations: M myopia (nearsighted); A astigmatism; H hyperopia  (farsighted); P presbyopia; Mrx spectacle prescription;  CTL contact lenses; OD right eye; OS left eye; OU both eyes  XT exotropia; ET esotropia; PEK punctate epithelial keratitis; PEE punctate epithelial erosions; DES dry eye syndrome; MGD meibomian gland dysfunction; ATs artificial tears; PFAT's preservative free artificial tears; NRiponnuclear sclerotic cataract; PSC posterior subcapsular cataract; ERM epi-retinal membrane; PVD posterior vitreous detachment; RD retinal detachment; DM diabetes mellitus; DR diabetic retinopathy; NPDR non-proliferative diabetic retinopathy; PDR proliferative diabetic retinopathy; CSME clinically significant macular edema; DME diabetic macular edema; dbh dot blot hemorrhages; CWS cotton wool spot; POAG primary open angle glaucoma; C/D cup-to-disc ratio; HVF humphrey visual field; GVF goldmann visual field; OCT optical coherence tomography; IOP intraocular pressure; BRVO Branch retinal vein occlusion; CRVO central retinal vein occlusion; CRAO central retinal artery occlusion; BRAO branch retinal artery occlusion; RT retinal tear; SB scleral buckle; PPV pars plana vitrectomy; VH Vitreous hemorrhage; PRP panretinal laser photocoagulation; IVK intravitreal kenalog; VMT vitreomacular traction; MH Macular hole;  NVD neovascularization of the disc; NVE  neovascularization elsewhere; AREDS age related eye disease study; ARMD age related macular degeneration; POAG primary open angle glaucoma; EBMD epithelial/anterior basement membrane dystrophy; ACIOL anterior chamber intraocular lens; IOL intraocular lens; PCIOL posterior chamber intraocular lens; Phaco/IOL phacoemulsification with intraocular lens placement; [place] photorefractive keratectomy; LASIK laser assisted in situ keratomileusis; HTN hypertension; DM diabetes mellitus; COPD chronic obstructive pulmonary disease

## 2020-09-26 ENCOUNTER — Other Ambulatory Visit: Payer: Self-pay

## 2020-09-26 ENCOUNTER — Encounter (INDEPENDENT_AMBULATORY_CARE_PROVIDER_SITE_OTHER): Payer: Self-pay | Admitting: Ophthalmology

## 2020-09-26 ENCOUNTER — Ambulatory Visit (INDEPENDENT_AMBULATORY_CARE_PROVIDER_SITE_OTHER): Payer: Medicare Other | Admitting: Ophthalmology

## 2020-09-26 DIAGNOSIS — E113493 Type 2 diabetes mellitus with severe nonproliferative diabetic retinopathy without macular edema, bilateral: Secondary | ICD-10-CM | POA: Diagnosis not present

## 2020-09-26 DIAGNOSIS — H34831 Tributary (branch) retinal vein occlusion, right eye, with macular edema: Secondary | ICD-10-CM

## 2020-09-26 MED ORDER — BEVACIZUMAB 2.5 MG/0.1ML IZ SOSY
2.5000 mg | PREFILLED_SYRINGE | INTRAVITREAL | Status: AC | PRN
  Administered 2020-09-26: 2.5 mg via INTRAVITREAL

## 2020-09-26 NOTE — Progress Notes (Signed)
09/26/2020     CHIEF COMPLAINT Patient presents for Retina Follow Up (7 week fu OU and Avastin OD/Pt states VA OU stable since last visit. Pt denies FOL, floaters, or ocular pain OU. Karie Mainland: 7.5/LBS:156/)   HISTORY OF PRESENT ILLNESS: Lacey Meyer is a 60 y.o. female who presents to the clinic today for:   HPI     Retina Follow Up           Diagnosis: CRVO/BRVO   Laterality: right eye   Onset: 7 weeks ago   Severity: mild   Duration: 7 weeks   Course: stable   Comments: 7 week fu OU and Avastin OD Pt states VA OU stable since last visit. Pt denies FOL, floaters, or ocular pain OU.  A1C: 7.5 LBS:156        Last edited by Kendra Opitz, COA on 09/26/2020 10:31 AM.      Referring physician: Nolene Ebbs, MD [place],  [place] [address]  HISTORICAL INFORMATION:   Selected notes from the MEDICAL RECORD NUMBER    Lab Results  Component Value Date   HGBA1C 10.1 (H) 11/07/2018     CURRENT MEDICATIONS: Current Outpatient Medications (Ophthalmic Drugs)  Medication Sig   Olopatadine HCl 0.2 % SOLN INSTILL 1 DROP IN [facility] EYE DAILY AS NEEDED   No current facility-administered medications for this visit. (Ophthalmic Drugs)   Current Outpatient Medications (Other)  Medication Sig   ACCU-CHEK GUIDE test strip USE TO TEST THREE TIMES DAILY (E11.65)   allopurinol (ZYLOPRIM) 300 MG tablet Take by mouth.   ALPRAZolam (XANAX) 0.25 MG tablet Take 0.25 mg by mouth daily as needed for anxiety.   B-D UF III MINI PEN NEEDLES 31G X 5 MM MISC    benazepril (LOTENSIN) 10 MG tablet Take 10 mg by mouth daily.   cephALEXin (KEFLEX) 500 MG capsule    colchicine 0.6 MG tablet Take by mouth.   diclofenac (VOLTAREN) 75 MG EC tablet Take by mouth.   diclofenac Sodium (VOLTAREN) 1 % GEL    diphenoxylate-atropine (LOMOTIL) 2.5-0.025 MG tablet TAKE 1 TABLET BY MOUTH EVERY 6 HOURS AS NEEDED FOR DIARRHEA   DULoxetine (CYMBALTA) 60 MG capsule Take 60 mg by mouth daily.     ferrous sulfate 325 (65 FE) MG tablet Take by mouth.   fluticasone (FLONASE) 50 MCG/ACT nasal spray USE 2 SPRAY(S) IN EACH NOSTRIL DAILY   furosemide (LASIX) 40 MG tablet Take 1 tablet (40 mg total) by mouth daily.   glimepiride (AMARYL) 4 MG tablet Take 4 mg by mouth 2 (two) times daily.    Lancets (ONETOUCH DELICA PLUS [id number]) MISC CHECK GLUCOSE 3 TIMES A DAY   LYRICA 150 MG capsule    LYRICA 200 MG capsule Take 200 mg by mouth 4 (four) times daily.    meloxicam (MOBIC) 15 MG tablet TAKE 1 TABLET BY MOUTH ONCE DAILY FOR 30 DAYS   metFORMIN (GLUCOPHAGE) 500 MG tablet Take 500 mg by mouth 2 (two) times daily.    methylPREDNISolone (MEDROL DOSEPAK) 4 MG TBPK tablet    metoprolol succinate (TOPROL-XL) 50 MG 24 hr tablet Take 50 mg by mouth daily.    Multiple Vitamins-Minerals (BARIATRIC FUSION PO) Take 1 tablet by mouth daily.   naloxone (NARCAN) 4 MG/0.1ML LIQD nasal spray kit PLEASE SEE ATTACHED FOR DETAILED DIRECTIONS   nortriptyline (PAMELOR) 50 MG capsule TAKE 1 CAPSULE BY MOUTH DAILY AT BEDTIME   NOVOLOG FLEXPEN 100 UNIT/ML FlexPen Inject 2-15 Units into the  skin 3 (three) times daily with meals.    omeprazole (PRILOSEC) 20 MG capsule Take 1 capsule (20 mg total) by mouth daily.   ondansetron (ZOFRAN-ODT) 4 MG disintegrating tablet DISSOLVE 1 TABLET IN MOUTH EVERY 8 HOURS AS NEEDED FOR UP TO 7 DAYS FOR NAUSEA OR VOMITING   Oxycodone HCl 10 MG TABS Take 10 mg by mouth every 8 (eight) hours as needed.   pantoprazole (PROTONIX) 40 MG tablet    simvastatin (ZOCOR) 40 MG tablet    spironolactone (ALDACTONE) 25 MG tablet Take by mouth.   tiZANidine (ZANAFLEX) 2 MG tablet TAKE 1 TABLET BY MOUTH AT BEDTIME AS NEEDED FOR PAINS   triamcinolone cream (KENALOG) 0.5 % APPLY CREAM TOPICALLY TO LEGS TWICE A DAY AS NEEDED   venlafaxine XR (EFFEXOR-XR) 150 MG 24 hr capsule Take 150 mg by mouth daily with breakfast.   VENTOLIN HFA 108 (90 Base) MCG/ACT inhaler    Vitamin D, Ergocalciferol, (DRISDOL)  1.25 MG (50000 UNIT) CAPS capsule    XULTOPHY 100-3.6 UNIT-MG/ML SOPN INJECT 20 UNITS SUBCUTANEOUSLY ONCE DAILY IN THE MORNING   zolpidem (AMBIEN) 10 MG tablet Take 10 mg by mouth at bedtime as needed.   No current facility-administered medications for this visit. (Other)      REVIEW OF SYSTEMS:    ALLERGIES Allergies  Allergen Reactions   Ampicillin Anaphylaxis, Hives and Other (See Comments)    Has patient had a PCN reaction causing immediate rash, facial/tongue/throat swelling, SOB or lightheadedness with hypotension: Yes Has patient had a PCN reaction causing severe rash involving mucus membranes or skin necrosis: Yes Has patient had a PCN reaction that required hospitalization Yes Has patient had a PCN reaction occurring within the last 10 years: No If all of the above answers are "NO", then may proceed with Cephalosporin use.    Penicillins Anaphylaxis, Hives and Nausea And Vomiting    Has patient had a PCN reaction causing immediate rash, facial/tongue/throat swelling, SOB or lightheadedness with hypotension: Yes Has patient had a PCN reaction causing severe rash involving mucus membranes or skin necrosis: Yes Has patient had a PCN reaction that required hospitalization Yes Has patient had a PCN reaction occurring within the last 10 years: No If all of the above answers are "NO", then may proceed with Cephalosporin use. Has patient had a PCN reaction causing immediate rash, facial/tongue/throat swelling, SOB or lightheadedness with hypotension: Yes Has patient had a PCN reaction causing severe rash involving mucus membranes or skin necrosis: Yes Has patient had a PCN reaction that required hospitalization Yes Has patient had a PCN reaction occurring within the last 10 years: No If all of the above answers are "NO", then may proceed with Cephalosporin use.   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   Past Surgical History:  Procedure Laterality Date   BTL     EYE SURGERY     cataract surgery / right eye    FOOT SURGERY     bilateral    LAPAROSCOPIC GASTRIC SLEEVE RESECTION  N/A 01/03/2016   Procedure: LAPAROSCOPIC GASTRIC SLEEVE RESECTION WITH UPPER ENDO;  Surgeon: Alphonsa Overall, MD;  Location: [facility];  Service: General;  Laterality: N/A;   SHOULDER ARTHROSCOPY W/ ROTATOR CUFF REPAIR     left    TUBAL LIGATION      FAMILY HISTORY Family History  Problem Relation Age of Onset   Hypertension Mother    Hypertension Father     SOCIAL HISTORY Social History   Tobacco Use   Smoking status: Former    Packs/day: 0.25    Years: 1.00    Pack years: 0.25    Types: Cigarettes    Quit date: 03/06/2010    Years since quitting: 10.5   Smokeless tobacco: Never  Substance Use Topics   Alcohol use: No   Drug use: No         OPHTHALMIC EXAM:  Base Eye Exam     Visual Acuity (ETDRS)       Right Left   Dist [place] 20/200 20/200   Dist ph [place] NI NI         Tonometry (Tonopen, 10:33 AM)       Right Left   Pressure 14 14         Pupils       Pupils Dark Light Shape React APD   Right PERRL 6 5 Round Slow None   Left PERRL 6 5 Round Slow None         Visual Fields (Counting fingers)       Left Right    Full Full         Extraocular Movement       Right Left    Full Full         Neuro/Psych     Oriented x3: Yes   Mood/Affect: Normal         Dilation     Both eyes: 1.0% Mydriacyl, 2.5% Phenylephrine @ 10:33 AM           Slit Lamp and Fundus Exam     External Exam       Right Left   External Normal  Normal         Slit Lamp Exam       Right Left   Lids/Lashes Normal Normal   Conjunctiva/Sclera White and quiet White and quiet   Cornea Clear Clear   Anterior Chamber Deep and quiet Deep and quiet   Iris Round and reactive Round and reactive   Lens Posterior chamber intraocular lens, 2+ Posterior capsular opacification centrally  Posterior chamber intraocular lens, Centered posterior chamber intraocular lens   Anterior Vitreous Normal Normal         Fundus Exam       Right Left   Posterior Vitreous ,, Posterior vitreous detachment    Disc Normal    C/D Ratio 0.3    Macula Retinal pigment epithelial mottling, no CME clinically    Vessels Old BRVO  collateralization along the superotemporal margin of the nerve    Periphery Normal, good  prp sup             IMAGING AND PROCEDURES  Imaging and Procedures for 09/26/20  OCT, Retina - OU - Both Eyes       Right Eye Quality was good. Scan locations included subfoveal. Central Foveal Thickness: 179. Progression has improved. Findings include vitreomacular adhesion , abnormal foveal contour, cystoid macular edema.   Left Eye Quality was good. Scan locations included subfoveal.  Central Foveal Thickness: 171. Progression has been stable. Findings include vitreomacular adhesion .   Notes Improved CME OD 7 weeks post injection for BRVO.  Will repeat injection today to maintain  OS diffuse retinal atrophy no active maculopathy     Intravitreal Injection, Pharmacologic Agent - OD - Right Eye       Time Out 09/26/2020. 10:58 AM. Confirmed correct patient, procedure, site, and patient consented.   Anesthesia Topical anesthesia was used. Anesthetic medications included [place] 3.5%.   Procedure Preparation included 5% betadine to ocular surface, 10% betadine to eyelids, Tobramycin 0.3%, Ofloxacin . A supplied needle was used.   Injection: 2.5 mg bevacizumab 2.5 MG/0.1ML   Route: Intravitreal, Site: Right Eye   NDC:  [phone], Lot: [id number]   Post-op Post injection exam found visual acuity of at least counting fingers. The patient tolerated the procedure well. There were no complications. The patient received written and verbal post procedure care education. Post injection medications were not given.              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  Branch retinal vein occlusion with macular edema of right eye Today, at 7-week follow-up interval no recurrence of CME as compared to January 2021.  We will repeat injection Avastin OD today and extend interval next time to 8 weeks.  Consider fluorescein angiography left right next time to confirm the stability of severe NPDR OS and also BRVO OD     ICD-10-CM   1. Branch retinal vein occlusion with macular edema of right eye  H34.8310 OCT, Retina - OU - Both Eyes    Intravitreal Injection, Pharmacologic Agent - OD - Right Eye    bevacizumab (AVASTIN) SOSY 2.5 mg    2. 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  OD, BRVO controlled as compared to January 2021.  At 7-week interval follow-up today.  We will repeat injection Avastin OD today, we will repeat evaluation in 8 weeks next  2.  Dilate OU next and study macular and retinal perfusion left eye right eye via fluorescein angiography next visit with likely injection right eye Avastin  3.  Ophthalmic Meds Ordered this visit:  Meds ordered this encounter  Medications   bevacizumab (AVASTIN) SOSY 2.5 mg       Return in about 8 weeks (around 11/21/2020) for DILATE OU, OPTOS FFA L/R, AVASTIN OCT, OD.  There are no Patient Instructions on file for this  visit.   Explained the diagnoses, plan, and follow up with the patient and they expressed understanding.  Patient expressed understanding of the importance of proper follow up care.   Clent Demark Adrinne Sze M.D. Diseases & Surgery of the Retina and Vitreous Retina & Diabetic [place] 09/26/20     Abbreviations: M myopia (nearsighted); A astigmatism; H hyperopia (farsighted); P presbyopia; Mrx spectacle prescription;  CTL contact lenses; OD right eye; OS left eye; OU both eyes  XT exotropia; ET esotropia; PEK punctate epithelial keratitis; PEE punctate epithelial erosions; DES dry eye syndrome; MGD meibomian gland dysfunction; ATs artificial tears; PFAT's preservative free artificial tears; [place] nuclear sclerotic cataract; PSC posterior subcapsular cataract; ERM epi-retinal membrane; PVD posterior vitreous detachment; RD retinal detachment; DM  diabetes mellitus; DR diabetic retinopathy; NPDR non-proliferative diabetic retinopathy; PDR proliferative diabetic retinopathy; CSME clinically significant macular edema; DME diabetic macular edema; dbh dot blot hemorrhages; CWS cotton wool spot; POAG primary open angle glaucoma; C/D cup-to-disc ratio; HVF humphrey visual field; GVF goldmann visual field; OCT optical coherence tomography; IOP intraocular pressure; BRVO Branch retinal vein occlusion; CRVO central retinal vein occlusion; CRAO central retinal artery occlusion; BRAO branch retinal artery occlusion; RT retinal tear; SB scleral buckle; PPV pars plana vitrectomy; VH Vitreous hemorrhage; PRP panretinal laser photocoagulation; IVK intravitreal kenalog; VMT vitreomacular traction; MH Macular hole;  NVD neovascularization of the disc; NVE neovascularization elsewhere; AREDS age related eye disease study; ARMD age related macular degeneration; POAG primary open angle glaucoma; EBMD epithelial/anterior basement membrane dystrophy; ACIOL anterior chamber intraocular lens; IOL intraocular lens; PCIOL posterior chamber  intraocular lens; Phaco/IOL phacoemulsification with intraocular lens placement; [place] photorefractive keratectomy; LASIK laser assisted in situ keratomileusis; HTN hypertension; DM diabetes mellitus; COPD chronic obstructive pulmonary disease

## 2020-09-26 NOTE — Assessment & Plan Note (Signed)
OD with sector PRP sent for BRVO consideration  OS no signs of extensive peripheral retinal nonperfusion at this time.

## 2020-09-26 NOTE — Assessment & Plan Note (Signed)
Today, at 7-week follow-up interval no recurrence of CME as compared to January 2021.  We will repeat injection Avastin OD today and extend interval next time to 8 weeks.  Consider fluorescein angiography left right next time to confirm the stability of severe NPDR OS and also BRVO OD

## 2020-10-07 ENCOUNTER — Other Ambulatory Visit: Payer: Self-pay

## 2020-10-07 ENCOUNTER — Ambulatory Visit (INDEPENDENT_AMBULATORY_CARE_PROVIDER_SITE_OTHER): Payer: Medicare Other | Admitting: Podiatry

## 2020-10-07 ENCOUNTER — Encounter: Payer: Self-pay | Admitting: Podiatry

## 2020-10-07 DIAGNOSIS — Z794 Long term (current) use of insulin: Secondary | ICD-10-CM

## 2020-10-07 DIAGNOSIS — M79675 Pain in left toe(s): Secondary | ICD-10-CM

## 2020-10-07 DIAGNOSIS — B351 Tinea unguium: Secondary | ICD-10-CM

## 2020-10-07 DIAGNOSIS — E1140 Type 2 diabetes mellitus with diabetic neuropathy, unspecified: Secondary | ICD-10-CM | POA: Diagnosis not present

## 2020-10-07 DIAGNOSIS — M79674 Pain in right toe(s): Secondary | ICD-10-CM

## 2020-10-07 NOTE — Progress Notes (Signed)
Complaint:  Visit Type: Patient returns to my office for continued preventative foot care services. Complaint: Patient states" my nails have grown long and thick and become painful to walk and wear shoes" Patient has been diagnosed with DM with no foot complications. The patient presents for preventative foot care services. No changes to ROS.  Patient has had surgery with Dr.  Al Corpus.  Podiatric Exam: Vascular: dorsalis pedis and posterior tibial pulses are palpable bilateral. Capillary return is immediate. Temperature gradient is WNL. Skin turgor WNL  Sensorium: Diminished  Semmes Weinstein monofilament test. Normal tactile sensation bilaterally. Nail Exam: Pt has thick disfigured discolored nails with subungual debris noted bilateral entire nail hallux through fifth toenails Ulcer Exam: There is no evidence of ulcer or pre-ulcerative changes or infection. Orthopedic Exam: Muscle tone and strength are WNL. No limitations in general ROM. No crepitus or effusions noted. Foot type and digits show no abnormalities. Bony prominences are unremarkable. Skin: No Porokeratosis. No infection or ulcers  Diagnosis:  Onychomycosis, , Pain in right toe, pain in left toes  Treatment & Plan Procedures and Treatment: Consent by patient was obtained for treatment procedures.   Debridement of mycotic and hypertrophic toenails, 1 through 5 bilateral and clearing of subungual debris. No ulceration, no infection noted.  Return Visit-Office Procedure: Patient instructed to return to the office for a follow up visit 3 months for continued evaluation and treatment.    Helane Gunther DPM

## 2020-11-22 ENCOUNTER — Encounter (INDEPENDENT_AMBULATORY_CARE_PROVIDER_SITE_OTHER): Payer: Self-pay | Admitting: Ophthalmology

## 2020-11-22 ENCOUNTER — Other Ambulatory Visit: Payer: Self-pay

## 2020-11-22 ENCOUNTER — Ambulatory Visit (INDEPENDENT_AMBULATORY_CARE_PROVIDER_SITE_OTHER): Payer: Medicare Other | Admitting: Ophthalmology

## 2020-11-22 DIAGNOSIS — E113493 Type 2 diabetes mellitus with severe nonproliferative diabetic retinopathy without macular edema, bilateral: Secondary | ICD-10-CM

## 2020-11-22 DIAGNOSIS — H34831 Tributary (branch) retinal vein occlusion, right eye, with macular edema: Secondary | ICD-10-CM

## 2020-11-22 MED ORDER — BEVACIZUMAB 2.5 MG/0.1ML IZ SOSY
2.5000 mg | PREFILLED_SYRINGE | INTRAVITREAL | Status: AC | PRN
  Administered 2020-11-22: 2.5 mg via INTRAVITREAL

## 2020-11-22 MED ORDER — FLUORESCEIN SODIUM 10 % IV SOLN
500.0000 mg | INTRAVENOUS | Status: AC | PRN
  Administered 2020-11-22: 500 mg via INTRAVENOUS

## 2020-11-22 NOTE — Progress Notes (Signed)
11/22/2020     CHIEF COMPLAINT Patient presents for  Chief Complaint  Patient presents with   Retina Follow Up    7 week fu OU and Avastin OD Pt states VA OU stable since last visit. Pt denies FOL, floaters, or ocular pain OU.  A1C: 7.5 LBS:156       HISTORY OF PRESENT ILLNESS: Lacey Meyer is a 60 y.o. female who presents to the clinic today for:   HPI     Retina Follow Up   Patient presents with  CRVO/BRVO.  In right eye.  This started 8 weeks ago.  Severity is mild.  Duration of 8 weeks.  Since onset it is stable. Additional comments: 7 week fu OU and Avastin OD Pt states VA OU stable since last visit. Pt denies FOL, floaters, or ocular pain OU.  A1C: 7.5 LBS:156         Comments   8 week fp/ffa l/r oct avastin od. Patient states vision is stable and unchanged since last visit. Denies any new floaters or FOL. LBS: 145 this morning after breakfast A1C: 8.0      Last edited by Laurin Coder on 11/22/2020 10:59 AM.      Referring physician: Nolene Ebbs, MD [place],  [place] [id number]  HISTORICAL INFORMATION:   Selected notes from the MEDICAL RECORD NUMBER    Lab Results  Component Value Date   HGBA1C 10.1 (H) 11/07/2018     CURRENT MEDICATIONS: Current Outpatient Medications (Ophthalmic Drugs)  Medication Sig   Olopatadine HCl 0.2 % SOLN INSTILL 1 DROP IN [facility] EYE DAILY AS NEEDED   No current facility-administered medications for this visit. (Ophthalmic Drugs)   Current Outpatient Medications (Other)  Medication Sig   ACCU-CHEK GUIDE test strip USE TO TEST THREE TIMES DAILY (E11.65)   allopurinol (ZYLOPRIM) 300 MG tablet Take by mouth.   ALPRAZolam (XANAX) 0.25 MG tablet Take 0.25 mg by mouth daily as needed for anxiety.   B-D UF III MINI PEN NEEDLES 31G X 5 MM MISC    benazepril (LOTENSIN) 10 MG tablet Take 10 mg by mouth daily.   cephALEXin (KEFLEX) 500 MG capsule    colchicine 0.6 MG tablet Take by mouth.    diclofenac (VOLTAREN) 75 MG EC tablet Take by mouth.   diclofenac Sodium (VOLTAREN) 1 % GEL    diphenoxylate-atropine (LOMOTIL) 2.5-0.025 MG tablet TAKE 1 TABLET BY MOUTH EVERY 6 HOURS AS NEEDED FOR DIARRHEA   DULoxetine (CYMBALTA) 60 MG capsule Take 60 mg by mouth daily.    ferrous sulfate 325 (65 FE) MG tablet Take by mouth.   fluticasone (FLONASE) 50 MCG/ACT nasal spray USE 2 SPRAY(S) IN EACH NOSTRIL DAILY   furosemide (LASIX) 40 MG tablet Take 1 tablet (40 mg total) by mouth daily.   glimepiride (AMARYL) 4 MG tablet Take 4 mg by mouth 2 (two) times daily.    Lancets (ONETOUCH DELICA PLUS [id number]) MISC CHECK GLUCOSE 3 TIMES A DAY   LYRICA 150 MG capsule    LYRICA 200 MG capsule Take 200 mg by mouth 4 (four) times daily.    meloxicam (MOBIC) 15 MG tablet TAKE 1 TABLET BY MOUTH ONCE DAILY FOR 30 DAYS   metFORMIN (GLUCOPHAGE) 500 MG tablet Take 500 mg by mouth 2 (two) times daily.    methylPREDNISolone (MEDROL DOSEPAK) 4 MG TBPK tablet    metoprolol succinate (TOPROL-XL) 50 MG 24 hr tablet Take 50 mg by mouth daily.    Multiple  Vitamins-Minerals (BARIATRIC FUSION PO) Take 1 tablet by mouth daily.   naloxone (NARCAN) 4 MG/0.1ML LIQD nasal spray kit PLEASE SEE ATTACHED FOR DETAILED DIRECTIONS   nortriptyline (PAMELOR) 50 MG capsule TAKE 1 CAPSULE BY MOUTH DAILY AT BEDTIME   NOVOLOG FLEXPEN 100 UNIT/ML FlexPen Inject 2-15 Units into the skin 3 (three) times daily with meals.    omeprazole (PRILOSEC) 20 MG capsule Take 1 capsule (20 mg total) by mouth daily.   ondansetron (ZOFRAN-ODT) 4 MG disintegrating tablet DISSOLVE 1 TABLET IN MOUTH EVERY 8 HOURS AS NEEDED FOR UP TO 7 DAYS FOR NAUSEA OR VOMITING   Oxycodone HCl 10 MG TABS Take 10 mg by mouth every 8 (eight) hours as needed.   pantoprazole (PROTONIX) 40 MG tablet    simvastatin (ZOCOR) 40 MG tablet    spironolactone (ALDACTONE) 25 MG tablet Take by mouth.   tiZANidine (ZANAFLEX) 2 MG tablet TAKE 1 TABLET BY MOUTH AT BEDTIME AS NEEDED FOR  PAINS   triamcinolone cream (KENALOG) 0.5 % APPLY CREAM TOPICALLY TO LEGS TWICE A DAY AS NEEDED   venlafaxine XR (EFFEXOR-XR) 150 MG 24 hr capsule Take 150 mg by mouth daily with breakfast.   VENTOLIN HFA 108 (90 Base) MCG/ACT inhaler    Vitamin D, Ergocalciferol, (DRISDOL) 1.25 MG (50000 UNIT) CAPS capsule    XULTOPHY 100-3.6 UNIT-MG/ML SOPN INJECT 20 UNITS SUBCUTANEOUSLY ONCE DAILY IN THE MORNING   zolpidem (AMBIEN) 10 MG tablet Take 10 mg by mouth at bedtime as needed.   No current facility-administered medications for this visit. (Other)      REVIEW OF SYSTEMS:    ALLERGIES Allergies  Allergen Reactions   Ampicillin Anaphylaxis, Hives and Other (See Comments)    Has patient had a PCN reaction causing immediate rash, facial/tongue/throat swelling, SOB or lightheadedness with hypotension: Yes Has patient had a PCN reaction causing severe rash involving mucus membranes or skin necrosis: Yes Has patient had a PCN reaction that required hospitalization Yes Has patient had a PCN reaction occurring within the last 10 years: No If all of the above answers are "NO", then may proceed with Cephalosporin use.    Penicillins Anaphylaxis, Hives and Nausea And Vomiting    Has patient had a PCN reaction causing immediate rash, facial/tongue/throat swelling, SOB or lightheadedness with hypotension: Yes Has patient had a PCN reaction causing severe rash involving mucus membranes or skin necrosis: Yes Has patient had a PCN reaction that required hospitalization Yes Has patient had a PCN reaction occurring within the last 10 years: No If all of the above answers are "NO", then may proceed with Cephalosporin use. Has patient had a PCN reaction causing immediate rash, facial/tongue/throat swelling, SOB or lightheadedness with hypotension: Yes Has patient had a PCN reaction causing severe rash involving mucus membranes or skin necrosis: Yes Has patient had a PCN reaction that required hospitalization  Yes Has patient had a PCN reaction occurring within the last 10 years: No If all of the above answers are "NO", then may proceed with Cephalosporin use.   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   Past Surgical History:  Procedure Laterality Date   BTL     EYE SURGERY     cataract surgery / right eye    FOOT SURGERY     bilateral    LAPAROSCOPIC GASTRIC SLEEVE RESECTION N/A 01/03/2016   Procedure: LAPAROSCOPIC GASTRIC SLEEVE RESECTION WITH UPPER ENDO;  Surgeon: Alphonsa Overall, MD;  Location: [facility];  Service: General;  Laterality: N/A;   SHOULDER ARTHROSCOPY W/ ROTATOR CUFF REPAIR     left    TUBAL LIGATION      FAMILY HISTORY Family History  Problem Relation Age of Onset   Hypertension Mother    Hypertension Father     SOCIAL HISTORY Social History   Tobacco Use   Smoking status: Former    Packs/day: 0.25    Years: 1.00    Pack years: 0.25    Types: Cigarettes    Quit date: 03/06/2010    Years since quitting: 10.7   Smokeless tobacco: Never  Substance Use Topics   Alcohol use: No   Drug use: No         OPHTHALMIC EXAM:  Base Eye Exam     Visual Acuity (ETDRS)       Right Left   Dist [place] 20/200 20/200   Dist ph [place] NI NI         Tonometry (Tonopen, 11:08 AM)       Right Left   Pressure 19 17         Pupils       Pupils APD   Right PERRL None   Left PERRL None         Extraocular Movement       Right Left    Full Full         Neuro/Psych     Oriented x3: Yes   Mood/Affect: Normal         Dilation     Both eyes: 1.0%  Mydriacyl, 2.5% Phenylephrine @ 11:08 AM           Slit Lamp and Fundus Exam     External Exam       Right Left   External Normal Normal         Slit Lamp Exam       Right Left   Lids/Lashes Normal Normal   Conjunctiva/Sclera White and quiet White and quiet   Cornea Clear Clear   Anterior Chamber Deep and quiet Deep and quiet   Iris Round and reactive Round and reactive   Lens Posterior chamber intraocular lens, 2+ Posterior capsular opacification centrally  Posterior chamber intraocular lens, Centered posterior chamber intraocular lens   Anterior Vitreous Normal Normal         Fundus Exam       Right Left   Posterior Vitreous ,, Posterior vitreous detachment ,, Posterior vitreous detachment   Disc Normal Normal,, watch nerve possible small area of collaterals and or neovascularization inferior OS   C/D Ratio 0.3 0.4   Macula Retinal pigment epithelial mottling, no CME clinically Retinal pigment epithelial mottling   Vessels Old BRVO  collateralization along the superotemporal margin of the nerve NPDR- Moderate   Periphery Normal, good  prp sup Normal            IMAGING AND PROCEDURES  Imaging and Procedures for 11/22/20  OCT, Retina - OU - Both Eyes       Right Eye Quality was good. Scan  locations included subfoveal. Central Foveal Thickness: 179. Progression has improved. Findings include vitreomacular adhesion , abnormal foveal contour, cystoid macular edema.   Left Eye Quality was good. Scan locations included subfoveal. Central Foveal Thickness: 171. Progression has been stable. Findings include vitreomacular adhesion .   Notes Improved CME OD 8 weeks post injection for BRVO.  Will repeat injection today to maintain  OS diffuse retinal atrophy no active maculopathy     Intravitreal Injection, Pharmacologic Agent - OD - Right Eye       Time Out 11/22/2020. 11:44 AM. Confirmed correct patient, procedure, site, and patient consented.    Anesthesia Topical anesthesia was used. Anesthetic medications included [place] 3.5%.   Procedure Preparation included 5% betadine to ocular surface, 10% betadine to eyelids, Tobramycin 0.3%, Ofloxacin . A 30 gauge needle was used.   Injection: 2.5 mg bevacizumab 2.5 MG/0.1ML   Route: Intravitreal, Site: Right Eye   NDC: 9727281339, Lot: [id number]   Post-op Post injection exam found visual acuity of at least counting fingers. The patient tolerated the procedure well. There were no complications. The patient received written and verbal post procedure care education. Post injection medications included ocuflox.      Color Fundus Photography Optos - OU - Both Eyes       Right Eye Progression has been stable. Disc findings include normal observations.   Left Eye Progression has been stable. Disc findings include neovascularization.   Notes Old branch retinal vein occlusion with collateralization of small NVE NVD inferior to nerve OS     Fluorescein Angiography Optos (Transit OS)       Injection: 500 mg Fluorescein Sodium 10 %   Route: Intravenous   NDC: 9547549641   Right Eye   Progression has no prior data. Early phase findings include microaneurysm, leakage. Mid/Late phase findings include microaneurysm.   Left Eye   Progression has no prior data. Mid/Late phase findings include leakage, neovascularization disc.   Notes Enlarged foveal avascular zone OD in fact may be ischemic in the superotemporal aspect of the peripheral retinal scars from laser noted in the superotemporal region of prior BRVO.  No active macular edema from these areas.  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  Nonetheless at 8-week follow-up CME still controlled.  Repeat injection today and follow-up next in 10 weeks  May 1-Day discontinue its use      ICD-10-CM   1. Branch retinal vein occlusion with macular edema of right eye  H34.8310 OCT, Retina - OU - Both Eyes    Intravitreal Injection, Pharmacologic Agent - OD - Right Eye    Color Fundus Photography Optos - OU - Both Eyes    Fluorescein Angiography Optos (Transit OS)    Fluorescein Sodium 10 % injection 500 mg    bevacizumab (AVASTIN) SOSY 2.5 mg    2. 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  OD, vastly improved CME as  compared to onset of BRVO, with secondary CME January 2021.  Repeat injection today at 8-week follow-up of Avastin.  Follow-up next in 10 weeks.  2.  OS, no active disease although there are early vessels inferior to the optic nerve which are surprising because there is no peripheral retinal vascular damage noted on angiography.  We will need to monitor and follow-up the left eye carefully  3.  Ophthalmic Meds Ordered this visit:  Meds ordered this encounter  Medications   Fluorescein Sodium 10 % injection 500 mg   bevacizumab (AVASTIN) SOSY 2.5 mg       Return in about 10 weeks (around 01/31/2021) for dilate, OD, AVASTIN OCT.  There are no Patient Instructions on file for this visit.   Explained the diagnoses, plan, and follow up with the patient and they expressed understanding.  Patient expressed understanding of the importance of proper follow up care.   Clent Demark Cyrilla Durkin M.D. Diseases & Surgery of the Retina and Vitreous [facility] [place] 11/22/20     Abbreviations: M myopia (nearsighted); A  astigmatism; H hyperopia (farsighted); P presbyopia; Mrx spectacle prescription;  CTL contact lenses; OD right eye; OS left eye; OU both eyes  XT exotropia; ET esotropia; PEK punctate epithelial keratitis; PEE punctate epithelial erosions; DES dry eye syndrome; MGD meibomian gland dysfunction; ATs artificial tears; PFAT's preservative free artificial tears; [place] nuclear sclerotic cataract; PSC posterior subcapsular cataract; ERM epi-retinal membrane; PVD posterior vitreous detachment; RD retinal detachment; DM diabetes mellitus; DR diabetic retinopathy; NPDR non-proliferative diabetic retinopathy; PDR proliferative diabetic retinopathy; CSME clinically significant macular edema; DME diabetic macular edema; dbh dot blot hemorrhages; CWS cotton wool spot; POAG primary open angle glaucoma; C/D cup-to-disc ratio; HVF humphrey visual field; GVF goldmann visual field; OCT optical coherence tomography; IOP intraocular pressure; BRVO Branch retinal vein occlusion; CRVO central retinal vein occlusion; CRAO central retinal artery occlusion; BRAO branch retinal artery occlusion; RT retinal tear; SB scleral buckle; PPV pars plana vitrectomy; VH Vitreous hemorrhage; PRP panretinal laser photocoagulation; IVK intravitreal kenalog; VMT vitreomacular traction; MH Macular hole;  NVD neovascularization of the disc; NVE neovascularization elsewhere; AREDS age related eye disease study; ARMD age related macular degeneration; POAG primary open angle glaucoma; EBMD epithelial/anterior basement membrane dystrophy; ACIOL anterior chamber intraocular lens; IOL intraocular lens; PCIOL posterior chamber intraocular lens; Phaco/IOL phacoemulsification with intraocular lens placement; [place] photorefractive keratectomy; LASIK laser assisted in situ keratomileusis; HTN hypertension; DM diabetes mellitus; COPD chronic obstructive pulmonary disease

## 2020-11-22 NOTE — Assessment & Plan Note (Signed)
Recurrent massive CME from BRVO developed January 2021, now vastly improved with complete resolution of the CME.  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  Nonetheless at 8-week follow-up CME still controlled.  Repeat injection today and follow-up next in 10 weeks  May 1-Day discontinue its use

## 2021-01-09 ENCOUNTER — Encounter: Payer: Self-pay | Admitting: Podiatry

## 2021-01-09 ENCOUNTER — Ambulatory Visit (INDEPENDENT_AMBULATORY_CARE_PROVIDER_SITE_OTHER): Payer: Medicare Other | Admitting: Podiatry

## 2021-01-09 ENCOUNTER — Other Ambulatory Visit: Payer: Self-pay

## 2021-01-09 DIAGNOSIS — B351 Tinea unguium: Secondary | ICD-10-CM

## 2021-01-09 DIAGNOSIS — E1140 Type 2 diabetes mellitus with diabetic neuropathy, unspecified: Secondary | ICD-10-CM | POA: Diagnosis not present

## 2021-01-09 DIAGNOSIS — M79675 Pain in left toe(s): Secondary | ICD-10-CM

## 2021-01-09 DIAGNOSIS — Z794 Long term (current) use of insulin: Secondary | ICD-10-CM

## 2021-01-09 DIAGNOSIS — M2012 Hallux valgus (acquired), left foot: Secondary | ICD-10-CM

## 2021-01-09 DIAGNOSIS — M79674 Pain in right toe(s): Secondary | ICD-10-CM

## 2021-01-09 NOTE — Progress Notes (Signed)
This patient returns to my office for at risk foot care.  This patient requires this care by a professional since this patient will be at risk due to having diabetes. She presents to the office with her husband.  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 This patient is unable to cut nails herself since the patient cannot reach her nails.These nails are painful walking and wearing shoes.  This patient presents for at risk foot care today.  General Appearance  Alert, conversant and in no acute stress.  Vascular  Dorsalis pedis and posterior tibial  pulses are palpable  bilaterally.  Capillary return is within normal limits  bilaterally. Temperature is within normal limits  bilaterally.  Neurologic  Senn-Weinstein monofilament wire test diminished  bilaterally. Muscle power within normal limits bilaterally.  Nails Thick disfigured discolored nails with subungual debris  from hallux to fifth toes bilaterally. No evidence of bacterial infection or drainage bilaterally.  Orthopedic  No limitations of motion  feet .  No crepitus or effusions noted.  No bony pathology or digital deformities noted.  Skin  normotropic skin with no porokeratosis noted bilaterally.  No signs of infections or ulcers noted.     Onychomycosis  Pain in right toes  Pain in left toes  Consent was obtained for treatment procedures.   Mechanical debridement of nails 1-5  bilaterally performed with a nail nipper.  Filed with dremel without incident.   Return office visit   3 months                   Told patient to return for periodic foot care and evaluation due to potential at risk complications.   Vickii Volland DPM   

## 2021-01-09 NOTE — Progress Notes (Deleted)
Complaint:  Visit Type: Patient returns to my office for continued preventative foot care services. Complaint: Patient states" my nails have grown long and thick and become painful to walk and wear shoes" Patient has been diagnosed with DM with no foot complications. The patient presents for preventative foot care services. No changes to ROS.  Patient has had surgery with Dr.  Al Corpus.  Podiatric Exam: Vascular: dorsalis pedis and posterior tibial pulses are palpable bilateral. Capillary return is immediate. Temperature gradient is WNL. Skin turgor WNL  Sensorium: Diminished  Semmes Weinstein monofilament test. Normal tactile sensation bilaterally. Nail Exam: Pt has thick disfigured discolored nails with subungual debris noted bilateral entire nail hallux through fifth toenails Ulcer Exam: There is no evidence of ulcer or pre-ulcerative changes or infection. Orthopedic Exam: Muscle tone and strength are WNL. No limitations in general ROM. No crepitus or effusions noted. Foot type and digits show no abnormalities. Bony prominences are unremarkable. Skin: No Porokeratosis. No infection or ulcers  Diagnosis:  Onychomycosis, , Pain in right toe, pain in left toes  Treatment & Plan Procedures and Treatment: Consent by patient was obtained for treatment procedures.   Debridement of mycotic and hypertrophic toenails, 1 through 5 bilateral and clearing of subungual debris. No ulceration, no infection noted.  Return Visit-Office Procedure: Patient instructed to return to the office for a follow up visit 3 months for continued evaluation and treatment.    Helane Gunther DPM

## 2021-01-30 ENCOUNTER — Other Ambulatory Visit: Payer: Self-pay

## 2021-01-30 ENCOUNTER — Encounter (INDEPENDENT_AMBULATORY_CARE_PROVIDER_SITE_OTHER): Payer: Medicare Other | Admitting: Ophthalmology

## 2021-01-30 ENCOUNTER — Ambulatory Visit (INDEPENDENT_AMBULATORY_CARE_PROVIDER_SITE_OTHER): Payer: Medicare Other | Admitting: Ophthalmology

## 2021-01-30 ENCOUNTER — Encounter (INDEPENDENT_AMBULATORY_CARE_PROVIDER_SITE_OTHER): Payer: Self-pay | Admitting: Ophthalmology

## 2021-01-30 DIAGNOSIS — H34831 Tributary (branch) retinal vein occlusion, right eye, with macular edema: Secondary | ICD-10-CM | POA: Diagnosis not present

## 2021-01-30 MED ORDER — BEVACIZUMAB CHEMO INJECTION 1.25MG/0.05ML SYRINGE FOR KALEIDOSCOPE
1.2500 mg | Freq: Once | INTRAVITREAL | Status: AC
  Administered 2021-07-03: 1.25 mg via INTRAVITREAL

## 2021-01-30 MED ORDER — BEVACIZUMAB 2.5 MG/0.1ML IZ SOSY
2.5000 mg | PREFILLED_SYRINGE | INTRAVITREAL | Status: AC | PRN
  Administered 2021-01-30: 2.5 mg via INTRAVITREAL

## 2021-01-30 NOTE — Assessment & Plan Note (Signed)
OD, controlled CME after recurrence onset January 2021.  Currently at 9-week 6-day follow-up with [place] 10-week follow-up.  With minimal recurrence of temporal CME OD.  We will maintain 10-week follow-up and hope for collateralization of the vessels and eventual extension of interval examination in the future

## 2021-01-30 NOTE — Progress Notes (Signed)
01/30/2021     CHIEF COMPLAINT Patient presents for  Chief Complaint  Patient presents with   Retina Follow Up    7 week fu OU and Avastin OD Pt states VA OU stable since last visit. Pt denies FOL, floaters, or ocular pain OU.  A1C: 7.5 LBS:156       HISTORY OF PRESENT ILLNESS: Lacey Meyer is a 60 y.o. female who presents to the clinic today for:   HPI     Retina Follow Up   Patient presents with  CRVO/BRVO.  In right eye.  This started 10 weeks ago.  Severity is mild.  Duration of 10 weeks.  Since onset it is stable. Additional comments: 7 week fu OU and Avastin OD Pt states VA OU stable since last visit. Pt denies FOL, floaters, or ocular pain OU.  A1C: 7.5 LBS:156         Comments   10 week fu OD oct avastin OD. Patient states vision is stable and unchanged since last visit. Denies any new floaters or FOL.        Last edited by Nelva Nay on 01/30/2021  8:44 AM.      Referring physician: Fleet Contras, MD [address] [place],  [place] [address]  HISTORICAL INFORMATION:   Selected notes from the MEDICAL RECORD NUMBER    Lab Results  Component Value Date   HGBA1C 10.1 (H) 11/07/2018     CURRENT MEDICATIONS: Current Outpatient Medications (Ophthalmic Drugs)  Medication Sig   Olopatadine HCl 0.2 % SOLN INSTILL 1 DROP IN [facility] EYE DAILY AS NEEDED   No current facility-administered medications for this visit. (Ophthalmic Drugs)   Current Outpatient Medications (Other)  Medication Sig   ACCU-CHEK GUIDE test strip USE TO TEST THREE TIMES DAILY (E11.65)   allopurinol (ZYLOPRIM) 300 MG tablet Take by mouth.   ALPRAZolam (XANAX) 0.25 MG tablet Take 0.25 mg by mouth daily as needed for anxiety.   B-D UF III MINI PEN NEEDLES 31G X 5 MM MISC    benazepril (LOTENSIN) 10 MG tablet Take 10 mg by mouth daily.   cephALEXin (KEFLEX) 500 MG capsule    colchicine 0.6 MG tablet Take by mouth.   diclofenac (VOLTAREN) 75 MG EC tablet Take by mouth.    diclofenac Sodium (VOLTAREN) 1 % GEL    diphenoxylate-atropine (LOMOTIL) 2.5-0.025 MG tablet TAKE 1 TABLET BY MOUTH EVERY 6 HOURS AS NEEDED FOR DIARRHEA   DULoxetine (CYMBALTA) 60 MG capsule Take 60 mg by mouth daily.    ferrous sulfate 325 (65 FE) MG tablet Take by mouth.   fluticasone (FLONASE) 50 MCG/ACT nasal spray USE 2 SPRAY(S) IN EACH NOSTRIL DAILY   furosemide (LASIX) 40 MG tablet Take 1 tablet (40 mg total) by mouth daily.   glimepiride (AMARYL) 4 MG tablet Take 4 mg by mouth 2 (two) times daily.    Lancets (ONETOUCH DELICA PLUS LANCET33G) MISC CHECK GLUCOSE 3 TIMES A DAY   LYRICA 150 MG capsule    LYRICA 200 MG capsule Take 200 mg by mouth 4 (four) times daily.    meloxicam (MOBIC) 15 MG tablet TAKE 1 TABLET BY MOUTH ONCE DAILY FOR 30 DAYS   metFORMIN (GLUCOPHAGE) 500 MG tablet Take 500 mg by mouth 2 (two) times daily.    methylPREDNISolone (MEDROL DOSEPAK) 4 MG TBPK tablet    metoprolol succinate (TOPROL-XL) 50 MG 24 hr tablet Take 50 mg by mouth daily.    Multiple Vitamins-Minerals (BARIATRIC FUSION PO) Take  1 tablet by mouth daily.   naloxone (NARCAN) 4 MG/0.1ML LIQD nasal spray kit PLEASE SEE ATTACHED FOR DETAILED DIRECTIONS   nortriptyline (PAMELOR) 50 MG capsule TAKE 1 CAPSULE BY MOUTH DAILY AT BEDTIME   NOVOLOG FLEXPEN 100 UNIT/ML FlexPen Inject 2-15 Units into the skin 3 (three) times daily with meals.    omeprazole (PRILOSEC) 20 MG capsule Take 1 capsule (20 mg total) by mouth daily.   ondansetron (ZOFRAN-ODT) 4 MG disintegrating tablet DISSOLVE 1 TABLET IN MOUTH EVERY 8 HOURS AS NEEDED FOR UP TO 7 DAYS FOR NAUSEA OR VOMITING   Oxycodone HCl 10 MG TABS Take 10 mg by mouth every 8 (eight) hours as needed.   pantoprazole (PROTONIX) 40 MG tablet    simvastatin (ZOCOR) 40 MG tablet    spironolactone (ALDACTONE) 25 MG tablet Take by mouth.   tiZANidine (ZANAFLEX) 2 MG tablet TAKE 1 TABLET BY MOUTH AT BEDTIME AS NEEDED FOR PAINS   triamcinolone cream (KENALOG) 0.5 % APPLY  CREAM TOPICALLY TO LEGS TWICE A DAY AS NEEDED   venlafaxine XR (EFFEXOR-XR) 150 MG 24 hr capsule Take 150 mg by mouth daily with breakfast.   VENTOLIN HFA 108 (90 Base) MCG/ACT inhaler    Vitamin D, Ergocalciferol, (DRISDOL) 1.25 MG (50000 UNIT) CAPS capsule    XULTOPHY 100-3.6 UNIT-MG/ML SOPN INJECT 20 UNITS SUBCUTANEOUSLY ONCE DAILY IN THE MORNING   zolpidem (AMBIEN) 10 MG tablet Take 10 mg by mouth at bedtime as needed.   Current Facility-Administered Medications (Other)  Medication Route   Bevacizumab (AVASTIN) SOLN 1.25 mg Intravitreal      REVIEW OF SYSTEMS:    ALLERGIES Allergies  Allergen Reactions   Ampicillin Anaphylaxis, Hives and Other (See Comments)    Has patient had a PCN reaction causing immediate rash, facial/tongue/throat swelling, SOB or lightheadedness with hypotension: Yes Has patient had a PCN reaction causing severe rash involving mucus membranes or skin necrosis: Yes Has patient had a PCN reaction that required hospitalization Yes Has patient had a PCN reaction occurring within the last 10 years: No If all of the above answers are "NO", then may proceed with Cephalosporin use.    Penicillins Anaphylaxis, Hives and Nausea And Vomiting    Has patient had a PCN reaction causing immediate rash, facial/tongue/throat swelling, SOB or lightheadedness with hypotension: Yes Has patient had a PCN reaction causing severe rash involving mucus membranes or skin necrosis: Yes Has patient had a PCN reaction that required hospitalization Yes Has patient had a PCN reaction occurring within the last 10 years: No If all of the above answers are "NO", then may proceed with Cephalosporin use. Has patient had a PCN reaction causing immediate rash, facial/tongue/throat swelling, SOB or lightheadedness with hypotension: Yes Has patient had a PCN reaction causing severe rash involving mucus membranes or skin necrosis: Yes Has patient had a PCN reaction that required hospitalization  Yes Has patient had a PCN reaction occurring within the last 10 years: No If all of the above answers are "NO", then may proceed with Cephalosporin use.   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   Past Surgical History:  Procedure Laterality Date   BTL     EYE SURGERY     cataract surgery / right eye    FOOT SURGERY     bilateral    LAPAROSCOPIC GASTRIC SLEEVE RESECTION N/A 01/03/2016   Procedure: LAPAROSCOPIC GASTRIC SLEEVE RESECTION WITH UPPER ENDO;  Surgeon: Alphonsa Overall, MD;  Location: [facility];  Service: General;  Laterality: N/A;   SHOULDER ARTHROSCOPY W/ ROTATOR CUFF REPAIR     left    TUBAL LIGATION      FAMILY HISTORY Family History  Problem Relation Age of Onset   Hypertension Mother    Hypertension Father     SOCIAL HISTORY Social History   Tobacco Use   Smoking status: Former    Packs/day: 0.25    Years: 1.00    Pack years: 0.25    Types: Cigarettes    Quit date: 03/06/2010    Years since quitting: 10.9   Smokeless tobacco: Never  Substance Use Topics   Alcohol use: No   Drug use: No         OPHTHALMIC EXAM:  Base Eye Exam     Visual Acuity (ETDRS)       Right Left   Dist [place] 20/200 20/200   Dist ph [place] 20/200 +1 20/200 +1         Tonometry (Tonopen, 8:51 AM)       Right Left   Pressure 18 16         Pupils       Pupils APD   Right PERRL None   Left PERRL None         Extraocular Movement       Right Left    Full Full         Neuro/Psych     Oriented x3: Yes   Mood/Affect: Normal         Dilation     Right  eye: 1.0% Mydriacyl, 2.5% Phenylephrine @ 8:51 AM           Slit Lamp and Fundus Exam     External Exam       Right Left   External Normal Normal         Slit Lamp Exam       Right Left   Lids/Lashes Normal Normal   Conjunctiva/Sclera White and quiet White and quiet   Cornea Clear Clear   Anterior Chamber Deep and quiet Deep and quiet   Iris Round and reactive Round and reactive   Lens Posterior chamber intraocular lens, 2+ Posterior capsular opacification centrally  Posterior chamber intraocular lens, Centered posterior chamber intraocular lens   Anterior Vitreous Normal Normal         Fundus Exam       Right Left   Posterior Vitreous ,, Posterior vitreous detachment    Disc Normal    C/D Ratio 0.3    Macula Retinal pigment epithelial mottling, no CME clinically    Vessels Old BRVO  collateralization along the superotemporal margin of the nerve    Periphery Normal, good sector prp sup             IMAGING AND PROCEDURES  Imaging and Procedures for 01/30/21  OCT, Retina - OU - Both Eyes       Right Eye Quality was good. Scan locations included subfoveal. Central Foveal Thickness: 180. Progression has improved. Findings include  vitreomacular adhesion , abnormal foveal contour, cystoid macular edema.   Left Eye Quality was good. Scan locations included subfoveal. Central Foveal Thickness: 172. Progression has been stable. Findings include vitreomacular adhesion .   Notes Improved CME OD 10 weeks post injection for BRVO.  Will repeat injection today to maintain  OS diffuse retinal atrophy no active maculopathy     Intravitreal Injection, Pharmacologic Agent - OD - Right Eye       Time Out 01/30/2021. 9:19 AM. Confirmed correct patient, procedure, site, and patient consented.   Anesthesia Topical anesthesia was used. Anesthetic medications included Lidocaine 4%.   Procedure Preparation included 5% betadine to ocular surface, 10% betadine to  eyelids, Tobramycin 0.3%, Ofloxacin . A 30 gauge needle was used.   Injection: 2.5 mg bevacizumab 2.5 MG/0.1ML   Route: Intravitreal, Site: Right Eye   NDC: [id number], Lot: [id number]   Post-op Post injection exam found visual acuity of at least counting fingers. The patient tolerated the procedure well. There were no complications. The patient received written and verbal post procedure care education. Post injection medications included ocuflox.              ASSESSMENT/PLAN:  Branch retinal vein occlusion with macular edema of right eye OD, controlled CME after recurrence onset January 2021.  Currently at 9-week 6-day follow-up with [place] 10-week follow-up.  With minimal recurrence of temporal CME OD.  We will maintain 10-week follow-up and hope for collateralization of the vessels and eventual extension of interval examination in the future     ICD-10-CM   1. Branch retinal vein occlusion with macular edema of right eye  H34.8310 OCT, Retina - OU - Both Eyes    Intravitreal Injection, Pharmacologic Agent - OD - Right Eye    bevacizumab (AVASTIN) SOSY 2.5 mg      1.  OD with improved overall and stable but BRVO with secondary CME temporally.  Center involvement threatening but not and present today at 10-week follow-up.  We will repeat injection today and maintain 10-week interval examination  2.  Dilate OU next  3.  Ophthalmic Meds Ordered this visit:  Meds ordered this encounter  Medications   Bevacizumab (AVASTIN) SOLN 1.25 mg   bevacizumab (AVASTIN) SOSY 2.5 mg       Return in about 10 weeks (around 04/10/2021) for DILATE OU, AVASTIN OCT, OD.  There are no Patient Instructions on file for this visit.   Explained the diagnoses, plan, and follow up with the patient and they expressed understanding.  Patient expressed understanding of the importance of proper follow up care.   Clent Demark Anjalee Cope M.D. Diseases & Surgery of the Retina and Vitreous [facility]ina [facility] [place] 01/30/21     Abbreviations: M myopia (nearsighted); A astigmatism; H hyperopia (farsighted); P presbyopia; Mrx spectacle prescription;  CTL contact lenses; OD right eye; OS left eye; OU both eyes  XT exotropia; ET esotropia; PEK punctate epithelial keratitis; PEE punctate epithelial erosions; DES dry eye syndrome; MGD meibomian gland dysfunction; ATs artificial tears; PFAT's preservative free artificial tears; [place] nuclear sclerotic cataract; PSC posterior subcapsular cataract; ERM epi-retinal membrane; PVD posterior vitreous detachment; RD retinal detachment; DM diabetes mellitus; DR diabetic retinopathy; NPDR non-proliferative diabetic retinopathy; PDR proliferative diabetic retinopathy; CSME clinically significant macular edema; DME diabetic macular edema; dbh dot blot hemorrhages; CWS cotton wool spot; POAG primary open angle glaucoma; C/D cup-to-disc ratio; HVF humphrey visual field; GVF goldmann visual field; OCT optical coherence tomography; IOP intraocular pressure; BRVO Branch retinal  vein occlusion; CRVO central retinal vein occlusion; CRAO central retinal artery occlusion; BRAO branch retinal artery occlusion; RT retinal tear; SB scleral buckle; PPV pars plana vitrectomy; VH Vitreous hemorrhage; PRP panretinal laser photocoagulation; IVK intravitreal kenalog; VMT vitreomacular traction; MH Macular hole;  NVD neovascularization of the disc; NVE neovascularization elsewhere; AREDS age related eye disease study; ARMD age related macular degeneration; POAG primary open angle glaucoma; EBMD epithelial/anterior basement membrane dystrophy; ACIOL anterior chamber intraocular lens; IOL intraocular lens; PCIOL posterior chamber intraocular lens; Phaco/IOL phacoemulsification with intraocular lens placement; [place] photorefractive keratectomy; LASIK laser assisted in situ keratomileusis; HTN hypertension; DM diabetes mellitus; COPD chronic obstructive pulmonary disease

## 2021-03-15 ENCOUNTER — Other Ambulatory Visit: Payer: Self-pay | Admitting: Internal Medicine

## 2021-03-16 LAB — COMPLETE METABOLIC PANEL WITHOUT GFR
AG Ratio: 1.2 (calc) (ref 1.0–2.5)
ALT: 21 U/L (ref 6–29)
AST: 24 U/L (ref 10–35)
Albumin: 4.2 g/dL (ref 3.6–5.1)
Alkaline phosphatase (APISO): 60 U/L (ref 37–153)
BUN/Creatinine Ratio: 15 (calc) (ref 6–22)
BUN: 20 mg/dL (ref 7–25)
CO2: 21 mmol/L (ref 20–32)
Calcium: 9.2 mg/dL (ref 8.6–10.4)
Chloride: 104 mmol/L (ref 98–110)
Creat: 1.37 mg/dL — ABNORMAL HIGH (ref 0.50–1.05)
Globulin: 3.6 g/dL (ref 1.9–3.7)
Glucose, Bld: 110 mg/dL — ABNORMAL HIGH (ref 65–99)
Potassium: 4.5 mmol/L (ref 3.5–5.3)
Sodium: 140 mmol/L (ref 135–146)
Total Bilirubin: 0.5 mg/dL (ref 0.2–1.2)
Total Protein: 7.8 g/dL (ref 6.1–8.1)
eGFR: 44 mL/min/{1.73_m2} — ABNORMAL LOW

## 2021-03-16 LAB — CBC
HCT: 36.1 % (ref 35.0–45.0)
Hemoglobin: 12.2 g/dL (ref 11.7–15.5)
MCH: 29.3 pg (ref 27.0–33.0)
MCHC: 33.8 g/dL (ref 32.0–36.0)
MCV: 86.8 fL (ref 80.0–100.0)
MPV: 10.5 fL (ref 7.5–12.5)
Platelets: 217 10*3/uL (ref 140–400)
RBC: 4.16 10*6/uL (ref 3.80–5.10)
RDW: 14.6 % (ref 11.0–15.0)
WBC: 6.1 10*3/uL (ref 3.8–10.8)

## 2021-03-16 LAB — LIPID PANEL
Cholesterol: 128 mg/dL (ref <200)
HDL: 44 mg/dL — ABNORMAL LOW (ref >=50)
LDL Cholesterol (Calc): 57 mg/dL (calc)
Non-HDL Cholesterol (Calc): 84 mg/dL (calc) (ref <130)
Total CHOL/HDL Ratio: 2.9 (calc) (ref <5.0)
Triglycerides: 202 mg/dL — ABNORMAL HIGH (ref <150)

## 2021-03-16 LAB — VITAMIN B12: Vitamin B-12: 618 pg/mL (ref 200–1100)

## 2021-03-16 LAB — VITAMIN D 25 HYDROXY (VIT D DEFICIENCY, FRACTURES): Vit D, 25-Hydroxy: 92 ng/mL (ref 30–100)

## 2021-03-16 LAB — TSH: TSH: 1.48 m[IU]/L (ref 0.40–4.50)

## 2021-03-16 LAB — FOLATE: Folate: >24 ng/mL

## 2021-03-16 LAB — URIC ACID: Uric Acid, Serum: 5.5 mg/dL (ref 2.5–7.0)

## 2021-03-20 ENCOUNTER — Other Ambulatory Visit: Payer: Self-pay | Admitting: Internal Medicine

## 2021-03-20 DIAGNOSIS — Z1231 Encounter for screening mammogram for malignant neoplasm of breast: Secondary | ICD-10-CM

## 2021-04-10 ENCOUNTER — Ambulatory Visit (INDEPENDENT_AMBULATORY_CARE_PROVIDER_SITE_OTHER): Payer: Medicare Other | Admitting: Ophthalmology

## 2021-04-10 ENCOUNTER — Other Ambulatory Visit: Payer: Self-pay

## 2021-04-10 ENCOUNTER — Encounter (INDEPENDENT_AMBULATORY_CARE_PROVIDER_SITE_OTHER): Payer: Self-pay | Admitting: Ophthalmology

## 2021-04-10 DIAGNOSIS — E113493 Type 2 diabetes mellitus with severe nonproliferative diabetic retinopathy without macular edema, bilateral: Secondary | ICD-10-CM

## 2021-04-10 DIAGNOSIS — H43821 Vitreomacular adhesion, right eye: Secondary | ICD-10-CM

## 2021-04-10 DIAGNOSIS — H34831 Tributary (branch) retinal vein occlusion, right eye, with macular edema: Secondary | ICD-10-CM

## 2021-04-10 MED ORDER — BEVACIZUMAB 2.5 MG/0.1ML IZ SOSY
2.5000 mg | PREFILLED_SYRINGE | INTRAVITREAL | Status: AC | PRN
  Administered 2021-04-10: 2.5 mg via INTRAVITREAL

## 2021-04-10 NOTE — Assessment & Plan Note (Signed)
Much improved CME currently at 10-week follow-up interval.  Repeat injection in Avastin today and extend interval examination next to 12 weeks

## 2021-04-10 NOTE — Assessment & Plan Note (Signed)
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

## 2021-04-10 NOTE — Assessment & Plan Note (Signed)
Minor OD observe 

## 2021-04-10 NOTE — Progress Notes (Signed)
04/10/2021     CHIEF COMPLAINT Patient presents for  Chief Complaint  Patient presents with   Retina Follow Up      HISTORY OF PRESENT ILLNESS: Lacey Meyer is a 61 y.o. female who presents to the clinic today for:   HPI     Retina Follow Up           Diagnosis: CRVO/BRVO   Laterality: both eyes   Onset: 10 weeks ago   Severity: mild   Duration: 10 weeks   Course: stable         Comments   10 week fu dilate ou and avastin OD and OCT- last injection 01/30/2021  Pt states VA OU stable since last visit. Pt denies FOL, floaters, or ocular pain OU.  Pt states blood sugars are stable       Last edited by Kendra Opitz, COA on 04/10/2021  9:34 AM.      Referring physician: Nolene Ebbs, MD [place],  [place] [address]  HISTORICAL INFORMATION:   Selected notes from the MEDICAL RECORD NUMBER    Lab Results  Component Value Date   HGBA1C 10.1 (H) 11/07/2018     CURRENT MEDICATIONS: Current Outpatient Medications (Ophthalmic Drugs)  Medication Sig   Olopatadine HCl 0.2 % SOLN INSTILL 1 DROP IN [facility] EYE DAILY AS NEEDED   No current facility-administered medications for this visit. (Ophthalmic Drugs)   Current Outpatient Medications (Other)  Medication Sig   ACCU-CHEK GUIDE test strip USE TO TEST THREE TIMES DAILY (E11.65)   allopurinol (ZYLOPRIM) 300 MG tablet Take by mouth.   ALPRAZolam (XANAX) 0.25 MG tablet Take 0.25 mg by mouth daily as needed for anxiety.   B-D UF III MINI PEN NEEDLES 31G X 5 MM MISC    benazepril (LOTENSIN) 10 MG tablet Take 10 mg by mouth daily.   cephALEXin (KEFLEX) 500 MG capsule    colchicine 0.6 MG tablet Take by mouth.   diclofenac (VOLTAREN) 75 MG EC tablet Take by mouth.   diclofenac Sodium (VOLTAREN) 1 % GEL    diphenoxylate-atropine (LOMOTIL) 2.5-0.025 MG tablet TAKE 1 TABLET BY MOUTH EVERY 6 HOURS AS NEEDED FOR DIARRHEA   DULoxetine (CYMBALTA) 60 MG capsule Take 60 mg by mouth daily.    ferrous  sulfate 325 (65 FE) MG tablet Take by mouth.   fluticasone (FLONASE) 50 MCG/ACT nasal spray USE 2 SPRAY(S) IN EACH NOSTRIL DAILY   furosemide (LASIX) 40 MG tablet Take 1 tablet (40 mg total) by mouth daily.   glimepiride (AMARYL) 4 MG tablet Take 4 mg by mouth 2 (two) times daily.    Lancets (ONETOUCH DELICA PLUS [id number]) MISC CHECK GLUCOSE 3 TIMES A DAY   LYRICA 150 MG capsule    LYRICA 200 MG capsule Take 200 mg by mouth 4 (four) times daily.    meloxicam (MOBIC) 15 MG tablet TAKE 1 TABLET BY MOUTH ONCE DAILY FOR 30 DAYS   metFORMIN (GLUCOPHAGE) 500 MG tablet Take 500 mg by mouth 2 (two) times daily.    methylPREDNISolone (MEDROL DOSEPAK) 4 MG TBPK tablet    metoprolol succinate (TOPROL-XL) 50 MG 24 hr tablet Take 50 mg by mouth daily.    Multiple Vitamins-Minerals (BARIATRIC FUSION PO) Take 1 tablet by mouth daily.   naloxone (NARCAN) 4 MG/0.1ML LIQD nasal spray kit PLEASE SEE ATTACHED FOR DETAILED DIRECTIONS   nortriptyline (PAMELOR) 50 MG capsule TAKE 1 CAPSULE BY MOUTH DAILY AT BEDTIME   NOVOLOG FLEXPEN 100 UNIT/ML  FlexPen Inject 2-15 Units into the skin 3 (three) times daily with meals.    omeprazole (PRILOSEC) 20 MG capsule Take 1 capsule (20 mg total) by mouth daily.   ondansetron (ZOFRAN-ODT) 4 MG disintegrating tablet DISSOLVE 1 TABLET IN MOUTH EVERY 8 HOURS AS NEEDED FOR UP TO 7 DAYS FOR NAUSEA OR VOMITING   Oxycodone HCl 10 MG TABS Take 10 mg by mouth every 8 (eight) hours as needed.   pantoprazole (PROTONIX) 40 MG tablet    simvastatin (ZOCOR) 40 MG tablet    spironolactone (ALDACTONE) 25 MG tablet Take by mouth.   tiZANidine (ZANAFLEX) 2 MG tablet TAKE 1 TABLET BY MOUTH AT BEDTIME AS NEEDED FOR PAINS   triamcinolone cream (KENALOG) 0.5 % APPLY CREAM TOPICALLY TO LEGS TWICE A DAY AS NEEDED   venlafaxine XR (EFFEXOR-XR) 150 MG 24 hr capsule Take 150 mg by mouth daily with breakfast.   VENTOLIN HFA 108 (90 Base) MCG/ACT inhaler    Vitamin D, Ergocalciferol, (DRISDOL) 1.25 MG  (50000 UNIT) CAPS capsule    XULTOPHY 100-3.6 UNIT-MG/ML SOPN INJECT 20 UNITS SUBCUTANEOUSLY ONCE DAILY IN THE MORNING   zolpidem (AMBIEN) 10 MG tablet Take 10 mg by mouth at bedtime as needed.   Current Facility-Administered Medications (Other)  Medication Route   Bevacizumab (AVASTIN) SOLN 1.25 mg Intravitreal      REVIEW OF SYSTEMS:    ALLERGIES Allergies  Allergen Reactions   Ampicillin Anaphylaxis, Hives and Other (See Comments)    Has patient had a PCN reaction causing immediate rash, facial/tongue/throat swelling, SOB or lightheadedness with hypotension: Yes Has patient had a PCN reaction causing severe rash involving mucus membranes or skin necrosis: Yes Has patient had a PCN reaction that required hospitalization Yes Has patient had a PCN reaction occurring within the last 10 years: No If all of the above answers are "NO", then may proceed with Cephalosporin use.    Penicillins Anaphylaxis, Hives and Nausea And Vomiting    Has patient had a PCN reaction causing immediate rash, facial/tongue/throat swelling, SOB or lightheadedness with hypotension: Yes Has patient had a PCN reaction causing severe rash involving mucus membranes or skin necrosis: Yes Has patient had a PCN reaction that required hospitalization Yes Has patient had a PCN reaction occurring within the last 10 years: No If all of the above answers are "NO", then may proceed with Cephalosporin use. Has patient had a PCN reaction causing immediate rash, facial/tongue/throat swelling, SOB or lightheadedness with hypotension: Yes Has patient had a PCN reaction causing severe rash involving mucus membranes or skin necrosis: Yes Has patient had a PCN reaction that required hospitalization Yes Has patient had a PCN reaction occurring within the last 10 years: No If all of the above answers are "NO", then may proceed with Cephalosporin use.   Doxycycline Other (See Comments)    unknown unknown unknown   Other  Swelling, Rash and Other (See Comments)    Blistex Blistex Blistex    PAST MEDICAL HISTORY 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   Past Surgical History:  Procedure Laterality Date   BTL     EYE SURGERY     cataract surgery / right eye    FOOT  SURGERY     bilateral    LAPAROSCOPIC GASTRIC SLEEVE RESECTION N/A 01/03/2016   Procedure: LAPAROSCOPIC GASTRIC SLEEVE RESECTION WITH UPPER ENDO;  Surgeon: Alphonsa Overall, MD;  Location: [facility]S;  Service: General;  Laterality: N/A;   SHOULDER ARTHROSCOPY W/ ROTATOR CUFF REPAIR     left    TUBAL LIGATION      FAMILY HISTORY Family History  Problem Relation Age of Onset   Hypertension Mother    Hypertension Father     SOCIAL HISTORY Social History   Tobacco Use   Smoking status: Former    Packs/day: 0.25    Years: 1.00    Pack years: 0.25    Types: Cigarettes    Quit date: 03/06/2010    Years since quitting: 11.1   Smokeless tobacco: Never  Substance Use Topics   Alcohol use: No   Drug use: No         OPHTHALMIC EXAM:  Base Eye Exam     Visual Acuity (ETDRS)       Right Left   Dist [place] 20/200 20/100   Dist ph [place] NI NI         Tonometry (Tonopen, 9:38 AM)       Right Left   Pressure 16 16         Pupils       Pupils Dark Light Shape React APD   Right PERRL 6 5 Round Brisk None   Left PERRL 6 5 Round Brisk None         Visual Fields (Counting fingers)       Left Right    Full Full         Extraocular Movement       Right Left    Full, Ortho Full, Ortho         Neuro/Psych     Oriented x3: Yes   Mood/Affect: Normal         Dilation     Both eyes: 1.0% Mydriacyl @ 9:38 AM           Slit Lamp and Fundus Exam     External Exam        Right Left   External Normal Normal         Slit Lamp Exam       Right Left   Lids/Lashes Normal Normal   Conjunctiva/Sclera White and quiet White and quiet   Cornea Clear Clear   Anterior Chamber Deep and quiet Deep and quiet   Iris Round and reactive Round and reactive   Lens Posterior chamber intraocular lens, 2+ Posterior capsular opacification centrally  Posterior chamber intraocular lens, Centered posterior chamber intraocular lens   Anterior Vitreous Normal Normal         Fundus Exam       Right Left   Posterior Vitreous ,, Posterior vitreous detachment ,, Posterior vitreous detachment   Disc Normal Normal,, watch nerve possible small area of collaterals and or neovascularization inferior OS   C/D Ratio 0.3 0.4   Macula Retinal pigment epithelial mottling, no CME clinically Retinal pigment epithelial mottling   Vessels Old BRVO  collateralization along the superotemporal margin of the nerve NPDR- Moderate   Periphery Normal, good sector prp sup Normal            IMAGING AND PROCEDURES  Imaging and Procedures for 04/10/21  OCT, Retina - OU - Both Eyes       Right Eye Quality was good. Scan  locations included subfoveal. Central Foveal Thickness: 185. Progression has improved. Findings include vitreomacular adhesion , abnormal foveal contour, cystoid macular edema.   Left Eye Quality was good. Scan locations included nasal. Progression has been stable.   Notes Improved CME OD 10 weeks post injection for BRVO.  Will repeat injection today to maintain, still stable OD extend interval next to 12 weeks  OS diffuse retinal atrophy no active maculopathy     Intravitreal Injection, Pharmacologic Agent - OD - Right Eye       Time Out 04/10/2021. 10:11 AM. Confirmed correct patient, procedure, site, and patient consented.   Anesthesia Topical anesthesia was used. Anesthetic medications included Lidocaine 4%.   Procedure Preparation included 5%  betadine to ocular surface, 10% betadine to eyelids, Tobramycin 0.3%, Ofloxacin . A 30 gauge needle was used.   Injection: 2.5 mg bevacizumab 2.5 MG/0.1ML   Route: Intravitreal, Site: Right Eye   NDC: [phone], Lot: [id number]   Post-op Post injection exam found visual acuity of at least counting fingers. The patient tolerated the procedure well. There were no complications. The patient received written and verbal post procedure care education. Post injection medications included ocuflox.              ASSESSMENT/PLAN:  Branch retinal vein occlusion with macular edema of right eye Much improved CME currently at 10-week follow-up interval.  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 Branch retinal vein occlusion with macular edema of right eye  H34.8310 OCT, Retina - OU - Both Eyes    Intravitreal Injection, Pharmacologic Agent - OD - Right Eye    bevacizumab (AVASTIN) SOSY 2.5 mg    2. 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 Vitreomacular adhesion of right eye  H43.821       1.  OD looks great, no active CME, remaining stable on intravitreal Avastin currently at 10-week follow-up interval post injection.  2.  Dilate OU next at 12 weeks possible injection Avastin OD  3.  Ophthalmic Meds Ordered this visit:  Meds ordered this encounter  Medications   bevacizumab (AVASTIN) SOSY 2.5 mg       Return in about 12 weeks (around 07/03/2021) for DILATE OU, AVASTIN OCT, OD.  There are no Patient Instructions on file for this visit.   Explained the diagnoses, plan, and  follow up with the patient and they expressed understanding.  Patient expressed understanding of the importance of proper follow up care.   Clent Demark Yanci Bachtell M.D. Diseases & Surgery of the Retina and Vitreous Retina & Diabetic [place] 04/10/21     Abbreviations: M myopia (nearsighted); A astigmatism; H hyperopia (farsighted); P presbyopia; Mrx spectacle prescription;  CTL contact lenses; OD right eye; OS left eye; OU both eyes  XT exotropia; ET esotropia; PEK punctate epithelial keratitis; PEE punctate epithelial erosions; DES dry eye syndrome; MGD meibomian gland dysfunction; ATs artificial tears; PFAT's preservative free artificial tears; [place] nuclear sclerotic cataract; PSC posterior subcapsular cataract; ERM epi-retinal membrane; PVD posterior vitreous detachment; RD retinal detachment; DM diabetes mellitus; DR diabetic retinopathy; NPDR non-proliferative diabetic retinopathy; PDR proliferative  diabetic retinopathy; CSME clinically significant macular edema; DME diabetic macular edema; dbh dot blot hemorrhages; CWS cotton wool spot; POAG primary open angle glaucoma; C/D cup-to-disc ratio; HVF humphrey visual field; GVF goldmann visual field; OCT optical coherence tomography; IOP intraocular pressure; BRVO Branch retinal vein occlusion; CRVO central retinal vein occlusion; CRAO central retinal artery occlusion; BRAO branch retinal artery occlusion; RT retinal tear; SB scleral buckle; PPV pars plana vitrectomy; VH Vitreous hemorrhage; PRP panretinal laser photocoagulation; IVK intravitreal kenalog; VMT vitreomacular traction; MH Macular hole;  NVD neovascularization of the disc; NVE neovascularization elsewhere; AREDS age related eye disease study; ARMD age related macular degeneration; POAG primary open angle glaucoma; EBMD epithelial/anterior basement membrane dystrophy; ACIOL anterior chamber intraocular lens; IOL intraocular lens; PCIOL posterior chamber intraocular lens; Phaco/IOL  phacoemulsification with intraocular lens placement; [place] photorefractive keratectomy; LASIK laser assisted in situ keratomileusis; HTN hypertension; DM diabetes mellitus; COPD chronic obstructive pulmonary disease

## 2021-04-11 ENCOUNTER — Ambulatory Visit
Admission: RE | Admit: 2021-04-11 | Discharge: 2021-04-11 | Disposition: A | Discharge status: Home / Self Care | Payer: Medicare Other | Source: Ambulatory Visit | Attending: Internal Medicine | Admitting: Internal Medicine

## 2021-04-11 DIAGNOSIS — Z1231 Encounter for screening mammogram for malignant neoplasm of breast: Secondary | ICD-10-CM

## 2021-04-14 ENCOUNTER — Ambulatory Visit (INDEPENDENT_AMBULATORY_CARE_PROVIDER_SITE_OTHER): Payer: Medicare Other | Admitting: Podiatry

## 2021-04-14 ENCOUNTER — Other Ambulatory Visit: Payer: Self-pay

## 2021-04-14 ENCOUNTER — Encounter: Payer: Self-pay | Admitting: Podiatry

## 2021-04-14 DIAGNOSIS — B351 Tinea unguium: Secondary | ICD-10-CM

## 2021-04-14 DIAGNOSIS — M2012 Hallux valgus (acquired), left foot: Secondary | ICD-10-CM

## 2021-04-14 DIAGNOSIS — M79675 Pain in left toe(s): Secondary | ICD-10-CM

## 2021-04-14 DIAGNOSIS — M79674 Pain in right toe(s): Secondary | ICD-10-CM

## 2021-04-14 DIAGNOSIS — E1140 Type 2 diabetes mellitus with diabetic neuropathy, unspecified: Secondary | ICD-10-CM | POA: Diagnosis not present

## 2021-04-14 DIAGNOSIS — Z794 Long term (current) use of insulin: Secondary | ICD-10-CM

## 2021-04-14 NOTE — Progress Notes (Signed)
This patient returns to my office for at risk foot care.  This patient requires this care by a professional since this patient will be at risk due to having diabetes. She presents to the office with her husband.  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 This patient is unable to cut nails herself since the patient cannot reach her nails.These nails are painful walking and wearing shoes.  This patient presents for at risk foot care today.  General Appearance  Alert, conversant and in no acute stress.  Vascular  Dorsalis pedis and posterior tibial  pulses are palpable  bilaterally.  Capillary return is within normal limits  bilaterally. Temperature is within normal limits  bilaterally.  Neurologic  Senn-Weinstein monofilament wire test diminished  bilaterally. Muscle power within normal limits bilaterally.  Nails Thick disfigured discolored nails with subungual debris  from hallux to fifth toes bilaterally. No evidence of bacterial infection or drainage bilaterally.  Orthopedic  No limitations of motion  feet .  No crepitus or effusions noted.  No bony pathology or digital deformities noted.  Skin  normotropic skin with no porokeratosis noted bilaterally.  No signs of infections or ulcers noted.     Onychomycosis  Pain in right toes  Pain in left toes  Consent was obtained for treatment procedures.   Mechanical debridement of nails 1-5  bilaterally performed with a nail nipper.  Filed with dremel without incident. Patient describes pain along her lateral border right hallux. Patient was referred for possible nail surgery for cauterization lateral border right hallux.   Return office visit   3 months                   Told patient to return for periodic foot care and evaluation due to potential at risk complications.   Helane Gunther DPM

## 2021-07-03 ENCOUNTER — Ambulatory Visit (INDEPENDENT_AMBULATORY_CARE_PROVIDER_SITE_OTHER): Payer: Medicare Other | Admitting: Ophthalmology

## 2021-07-03 ENCOUNTER — Encounter (INDEPENDENT_AMBULATORY_CARE_PROVIDER_SITE_OTHER): Payer: Self-pay | Admitting: Ophthalmology

## 2021-07-03 DIAGNOSIS — E113493 Type 2 diabetes mellitus with severe nonproliferative diabetic retinopathy without macular edema, bilateral: Secondary | ICD-10-CM | POA: Diagnosis not present

## 2021-07-03 DIAGNOSIS — H34831 Tributary (branch) retinal vein occlusion, right eye, with macular edema: Secondary | ICD-10-CM | POA: Diagnosis not present

## 2021-07-03 MED ORDER — BEVACIZUMAB CHEMO INJECTION 1.25MG/0.05ML SYRINGE FOR KALEIDOSCOPE
1.2500 mg | INTRAVITREAL | Status: AC | PRN
  Administered 2021-07-03: 1.25 mg via INTRAVITREAL

## 2021-07-03 MED ORDER — BEVACIZUMAB 2.5 MG/0.1ML IZ SOSY
2.5000 mg | PREFILLED_SYRINGE | INTRAVITREAL | Status: AC | PRN
  Administered 2021-07-03: 2.5 mg via INTRAVITREAL

## 2021-07-03 NOTE — Progress Notes (Signed)
? ? ?07/03/2021 ? ?  ? ?CHIEF COMPLAINT ?Patient presents for  ?Chief Complaint  ?Patient presents with  ? Branch Retinal Vein Occlusion  ? ? ? ? ?HISTORY OF PRESENT ILLNESS: ?Lacey Meyer is a 61 y.o. female who presents to the clinic today for:  ? ?HPI   ?12 weeks Dilate OU, Avastin OD, OCT. ?Patient states vision is stable and unchanged since last visit. Denies any new floaters or FOL. ?Dr. Kathlen Mody was patient's eye doctor  ?Last edited by Laurin Coder on 07/03/2021 11:13 AM.  ?  ? ? ?Referring physician: ?Nolene Ebbs, MD ?[address] ?[place],  [place] [address] ? ?HISTORICAL INFORMATION:  ? ?Selected notes from the [facility] ?  ? ?Lab Results  ?Component Value Date  ? HGBA1C 10.1 (H) 11/07/2018  ?  ? ?CURRENT MEDICATIONS: ?Current Outpatient Medications (Ophthalmic Drugs)  ?Medication Sig  ? Olopatadine HCl 0.2 % SOLN INSTILL 1 DROP IN [facility] EYE DAILY AS NEEDED  ? ?No current facility-administered medications for this visit. (Ophthalmic Drugs)  ? ?Current Outpatient Medications (Other)  ?Medication Sig  ? ACCU-CHEK GUIDE test strip USE TO TEST THREE TIMES DAILY (E11.65)  ? allopurinol (ZYLOPRIM) 300 MG tablet Take by mouth.  ? ALPRAZolam (XANAX) 0.25 MG tablet Take 0.25 mg by mouth daily as needed for anxiety.  ? B-D UF III MINI PEN NEEDLES 31G X 5 MM MISC   ? benazepril (LOTENSIN) 10 MG tablet Take 10 mg by mouth daily.  ? cephALEXin (KEFLEX) 500 MG capsule   ? colchicine 0.6 MG tablet Take by mouth.  ? diclofenac (VOLTAREN) 75 MG EC tablet Take by mouth.  ? diclofenac Sodium (VOLTAREN) 1 % GEL   ? diphenoxylate-atropine (LOMOTIL) 2.5-0.025 MG tablet TAKE 1 TABLET BY MOUTH EVERY 6 HOURS AS NEEDED FOR DIARRHEA  ? DULoxetine (CYMBALTA) 60 MG capsule Take 60 mg by mouth daily.   ? ferrous sulfate 325 (65 FE) MG tablet Take by mouth.  ? fluticasone (FLONASE) 50 MCG/ACT nasal spray USE 2 SPRAY(S) IN EACH NOSTRIL DAILY  ? furosemide (LASIX) 40 MG tablet Take 1 tablet (40 mg total) by mouth daily.   ? glimepiride (AMARYL) 4 MG tablet Take 4 mg by mouth 2 (two) times daily.   ? Lancets (ONETOUCH DELICA PLUS [id number]) MISC CHECK GLUCOSE 3 TIMES A DAY  ? LYRICA 150 MG capsule   ? LYRICA 200 MG capsule Take 200 mg by mouth 4 (four) times daily.   ? meloxicam (MOBIC) 15 MG tablet TAKE 1 TABLET BY MOUTH ONCE DAILY FOR 30 DAYS  ? metFORMIN (GLUCOPHAGE) 500 MG tablet Take 500 mg by mouth 2 (two) times daily.   ? methylPREDNISolone (MEDROL DOSEPAK) 4 MG TBPK tablet   ? metoprolol succinate (TOPROL-XL) 50 MG 24 hr tablet Take 50 mg by mouth daily.   ? Multiple Vitamins-Minerals (BARIATRIC FUSION PO) Take 1 tablet by mouth daily.  ? naloxone (NARCAN) 4 MG/0.1ML LIQD nasal spray kit PLEASE SEE ATTACHED FOR DETAILED DIRECTIONS  ? nortriptyline (PAMELOR) 50 MG capsule TAKE 1 CAPSULE BY MOUTH DAILY AT BEDTIME  ? NOVOLOG FLEXPEN 100 UNIT/ML FlexPen Inject 2-15 Units into the skin 3 (three) times daily with meals.   ? omeprazole (PRILOSEC) 20 MG capsule Take 1 capsule (20 mg total) by mouth daily.  ? ondansetron (ZOFRAN-ODT) 4 MG disintegrating tablet DISSOLVE 1 TABLET IN MOUTH EVERY 8 HOURS AS NEEDED FOR UP TO 7 DAYS FOR NAUSEA OR VOMITING  ? Oxycodone HCl 10 MG TABS Take 10  mg by mouth every 8 (eight) hours as needed.  ? pantoprazole (PROTONIX) 40 MG tablet   ? simvastatin (ZOCOR) 40 MG tablet   ? spironolactone (ALDACTONE) 25 MG tablet Take by mouth.  ? tiZANidine (ZANAFLEX) 2 MG tablet TAKE 1 TABLET BY MOUTH AT BEDTIME AS NEEDED FOR PAINS  ? triamcinolone cream (KENALOG) 0.5 % APPLY CREAM TOPICALLY TO LEGS TWICE A DAY AS NEEDED  ? venlafaxine XR (EFFEXOR-XR) 150 MG 24 hr capsule Take 150 mg by mouth daily with breakfast.  ? VENTOLIN HFA 108 (90 Base) MCG/ACT inhaler   ? Vitamin D, Ergocalciferol, (DRISDOL) 1.25 MG (50000 UNIT) CAPS capsule   ? XULTOPHY 100-3.6 UNIT-MG/ML SOPN INJECT 20 UNITS SUBCUTANEOUSLY ONCE DAILY IN THE MORNING  ? zolpidem (AMBIEN) 10 MG tablet Take 10 mg by mouth at bedtime as needed.  ? ?No current  facility-administered medications for this visit. (Other)  ? ? ? ? ?REVIEW OF SYSTEMS: ?ROS   ?Negative for: Constitutional, Gastrointestinal, Neurological, Skin, Genitourinary, Musculoskeletal, HENT, Endocrine, Cardiovascular, Eyes, Respiratory, Psychiatric, Allergic/Imm, Heme/Lymph ?Last edited by Hurman Horn, MD on 07/03/2021 11:53 AM.  ?  ? ? ? ?ALLERGIES ?Allergies  ?Allergen Reactions  ? Ampicillin Anaphylaxis, Hives and Other (See Comments)  ?  Has patient had a PCN reaction causing immediate rash, facial/tongue/throat swelling, SOB or lightheadedness with hypotension: Yes ?Has patient had a PCN reaction causing severe rash involving mucus membranes or skin necrosis: Yes ?Has patient had a PCN reaction that required hospitalization Yes ?Has patient had a PCN reaction occurring within the last 10 years: No ?If all of the above answers are "NO", then may proceed with Cephalosporin use. ?  ? Penicillins Anaphylaxis, Hives and Nausea And Vomiting  ?  Has patient had a PCN reaction causing immediate rash, facial/tongue/throat swelling, SOB or lightheadedness with hypotension: Yes ?Has patient had a PCN reaction causing severe rash involving mucus membranes or skin necrosis: Yes ?Has patient had a PCN reaction that required hospitalization Yes ?Has patient had a PCN reaction occurring within the last 10 years: No ?If all of the above answers are "NO", then may proceed with Cephalosporin use. ?Has patient had a PCN reaction causing immediate rash, facial/tongue/throat swelling, SOB or lightheadedness with hypotension: Yes ?Has patient had a PCN reaction causing severe rash involving mucus membranes or skin necrosis: Yes ?Has patient had a PCN reaction that required hospitalization Yes ?Has patient had a PCN reaction occurring within the last 10 years: No ?If all of the above answers are "NO", then may proceed with Cephalosporin use.  ? Doxycycline Other (See Comments)  ?  unknown ?unknown ?unknown  ? Other  Swelling, Rash and Other (See Comments)  ?  Blistex ?Blistex ?Blistex  ? ? ?PAST MEDICAL HISTORY ?Past Medical History:  ?Diagnosis Date  ? Anemia   ? Arthritis   ? Diabetes mellitus without complication ([place])   ? GERD (gastroesophageal reflux disease)   ? Gout   ? History of blood transfusion   ? HTN (hypertension)   ? Impaired vision   ? 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 ?Past Surgical History:  ?Procedure Laterality Date  ? BTL    ? EYE SURGERY    ? cataract surgery / right eye   ? FOOT SURGERY    ? bilateral   ? LAPAROSCOPIC GASTRIC SLEEVE RESECTION N/A 01/03/2016  ? Procedure: LAPAROSCOPIC GASTRIC SLEEVE RESECTION WITH UPPER ENDO;  Surgeon: Alphonsa Overall, MD;  Location: [facility];  Service: General;  Laterality: N/A;  ?  SHOULDER ARTHROSCOPY W/ ROTATOR CUFF REPAIR    ? left   ? TUBAL LIGATION    ? ? ?FAMILY HISTORY ?Family History  ?Problem Relation Age of Onset  ? Hypertension Mother   ? Hypertension Father   ? Breast cancer Sister   ?     67s  ? ? ?SOCIAL HISTORY ?Social History  ? ?Tobacco Use  ? Smoking status: Former  ?  Packs/day: 0.25  ?  Years: 1.00  ?  Pack years: 0.25  ?  Types: Cigarettes  ?  Quit date: 03/06/2010  ?  Years since quitting: 11.3  ? Smokeless tobacco: Never  ?Substance Use Topics  ? Alcohol use: No  ? Drug use: No  ? ?  ? ?  ? ?OPHTHALMIC EXAM: ? ?Base Eye Exam   ? ? Visual Acuity (ETDRS)   ? ?   Right Left  ? Dist [place] 20/125 20/125  ? Dist ph [place] NI 20/100 -2  ? ?  ?  ? ? Tonometry (Tonopen, 11:20 AM)   ? ?   Right Left  ? Pressure 9 11  ? ?  ?  ? ? Pupils   ? ?   Pupils Dark Light APD  ? Right PERRL 6 5 None  ? Left PERRL 6 5 None  ? ?  ?  ? ? Visual Fields   ? ?   Left Right  ?  Full Full  ? ?  ?  ? ? Extraocular Movement   ? ?   Right Left  ?  Full, Ortho Full, Ortho  ? ?  ?  ? ? Neuro/Psych   ? ? Oriented x3: Yes  ? Mood/Affect: Normal  ? ?  ?  ? ? Dilation   ? ? Both eyes: 1.0% Mydriacyl, 2.5% Phenylephrine @ 11:20 AM  ? ?  ?  ? ?  ? ?Slit Lamp and Fundus Exam   ? ?  External Exam   ? ?   Right Left  ? External Normal Normal  ? ?  ?  ? ? Slit Lamp Exam   ? ?   Right Left  ? Lids/Lashes Normal Normal  ? Conjunctiva/Sclera White and quiet White and quiet  ? Cornea Clear Clear  ? A

## 2021-07-03 NOTE — Assessment & Plan Note (Signed)
OD doing well currently and maintain resolution of CME at 12-week follow-up today from BRVO.  Repeat injection today and with a history of massive recurrence in the past we will maintain 12-week follow-up and interval examination ?

## 2021-07-03 NOTE — Assessment & Plan Note (Signed)
No interval change in the retinopathy in either eye ?

## 2021-07-14 ENCOUNTER — Ambulatory Visit (INDEPENDENT_AMBULATORY_CARE_PROVIDER_SITE_OTHER): Payer: Medicare Other | Admitting: Podiatry

## 2021-07-14 ENCOUNTER — Encounter: Payer: Self-pay | Admitting: Podiatry

## 2021-07-14 DIAGNOSIS — Z794 Long term (current) use of insulin: Secondary | ICD-10-CM

## 2021-07-14 DIAGNOSIS — E1140 Type 2 diabetes mellitus with diabetic neuropathy, unspecified: Secondary | ICD-10-CM | POA: Diagnosis not present

## 2021-07-14 DIAGNOSIS — B351 Tinea unguium: Secondary | ICD-10-CM | POA: Diagnosis not present

## 2021-07-14 DIAGNOSIS — M2012 Hallux valgus (acquired), left foot: Secondary | ICD-10-CM

## 2021-07-14 DIAGNOSIS — M79675 Pain in left toe(s): Secondary | ICD-10-CM | POA: Diagnosis not present

## 2021-07-14 DIAGNOSIS — M79674 Pain in right toe(s): Secondary | ICD-10-CM

## 2021-07-14 NOTE — Progress Notes (Signed)
This patient returns to my office for at risk foot care.  This patient requires this care by a professional since this patient will be at risk due to having diabetes. She presents to the office with her husband.  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 This patient is unable to cut nails herself since the patient cannot reach her nails.These nails are painful walking and wearing shoes.  This patient presents for at risk foot care today.  General Appearance  Alert, conversant and in no acute stress.  Vascular  Dorsalis pedis and posterior tibial  pulses are palpable  bilaterally.  Capillary return is within normal limits  bilaterally. Temperature is within normal limits  bilaterally.  Neurologic  Senn-Weinstein monofilament wire test diminished  bilaterally. Muscle power within normal limits bilaterally.  Nails Thick disfigured discolored nails with subungual debris  from hallux to fifth toes bilaterally. No evidence of bacterial infection or drainage bilaterally.  Orthopedic  No limitations of motion  feet .  No crepitus or effusions noted.  No bony pathology or digital deformities noted.  Skin  normotropic skin with no porokeratosis noted bilaterally.  No signs of infections or ulcers noted.     Onychomycosis  Pain in right toes  Pain in left toes  Consent was obtained for treatment procedures.   Mechanical debridement of nails 1-5  bilaterally performed with a nail nipper.  Filed with dremel without incident.   Return office visit   3 months                   Told patient to return for periodic foot care and evaluation due to potential at risk complications.   Ikeisha Blumberg DPM   

## 2021-07-27 ENCOUNTER — Encounter (HOSPITAL_COMMUNITY): Payer: Self-pay | Admitting: *Deleted

## 2021-08-19 ENCOUNTER — Encounter (HOSPITAL_COMMUNITY): Payer: Self-pay

## 2021-08-19 ENCOUNTER — Ambulatory Visit (HOSPITAL_COMMUNITY)
Admission: EM | Admit: 2021-08-19 | Discharge: 2021-08-19 | Disposition: A | Discharge status: Home / Self Care | Payer: Medicare Other | Attending: Emergency Medicine | Admitting: Emergency Medicine

## 2021-08-19 DIAGNOSIS — Z20822 Contact with and (suspected) exposure to covid-19: Secondary | ICD-10-CM | POA: Insufficient documentation

## 2021-08-19 DIAGNOSIS — E119 Type 2 diabetes mellitus without complications: Secondary | ICD-10-CM | POA: Diagnosis not present

## 2021-08-19 DIAGNOSIS — M791 Myalgia, unspecified site: Secondary | ICD-10-CM | POA: Insufficient documentation

## 2021-08-19 DIAGNOSIS — K219 Gastro-esophageal reflux disease without esophagitis: Secondary | ICD-10-CM | POA: Diagnosis not present

## 2021-08-19 DIAGNOSIS — I10 Essential (primary) hypertension: Secondary | ICD-10-CM | POA: Diagnosis not present

## 2021-08-19 DIAGNOSIS — J0100 Acute maxillary sinusitis, unspecified: Secondary | ICD-10-CM | POA: Diagnosis present

## 2021-08-19 DIAGNOSIS — M109 Gout, unspecified: Secondary | ICD-10-CM | POA: Diagnosis not present

## 2021-08-19 DIAGNOSIS — M1990 Unspecified osteoarthritis, unspecified site: Secondary | ICD-10-CM | POA: Insufficient documentation

## 2021-08-19 MED ORDER — LIDOCAINE VISCOUS HCL 2 % MT SOLN
15.0000 mL | OROMUCOSAL | 0 refills | Status: AC | PRN
Start: 2021-08-19 — End: ?

## 2021-08-19 MED ORDER — FLUTICASONE PROPIONATE 50 MCG/ACT NA SUSP: 1.0000 | Freq: Every day | NASAL | 2 refills | Status: AC

## 2021-08-19 NOTE — ED Provider Notes (Signed)
[place]    CSN: [id number] Arrival date & time: 08/19/21  1409      History   Chief Complaint Chief Complaint  Patient presents with   Cough    HPI Lacey Meyer is a 61 y.o. female.   Patient presents with fever, chills, body aches, nasal congestion, rhinorrhea, postnasal drip, right-sided sinus pressure, sore throat, nonproductive cough for 3 days.  Known sick contact.  Decreased appetite but tolerating fluids.  Fever has resolved.  Has been using Mucinex which has been ineffective.  History of arthritis, diabetes, GERD, gout, hypertension.  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 ([place]) 06/16/2019   Cellulitis and abscess of foot, except toes 12/11/2018   Pain due to onychomycosis of toenails of both feet 08/27/2018   Deviated septum 09/23/2017   ETD (Eustachian tube dysfunction), bilateral 09/23/2017   Nasal turbinate hypertrophy 09/23/2017   OM (otitis media), recurrent, bilateral 09/23/2017   Rhinitis, chronic 09/23/2017   Community acquired pneumonia of left lower lobe of lung    Lactic acidosis    Left lower lobe pneumonia 06/16/2016   Sepsis ([place]) 06/16/2016   Acute encephalopathy 03/19/2016   Diabetes mellitus type 2 in obese ([place]) 03/19/2016   Altered mental status 03/18/2016   Morbid obesity ([place])  01/03/2016   Retinal hemorrhage 02/28/2013   Mononeuritis of unspecified site 12/23/2012   Pain in limb 12/23/2012   Arthritis of foot 12/23/2012   Obesity, Class II, BMI 35-39.9, with comorbidity 06/25/2011   HTN (hypertension)    Impaired vision    Gout    Anemia    Pseudoaphakia 02/20/2011   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      Home Medications    Prior to Admission medications   Medication Sig Start Date End Date Taking? Authorizing Provider  ACCU-CHEK GUIDE test strip USE TO TEST THREE TIMES DAILY (E11.65) 03/17/18   [provider]  allopurinol (ZYLOPRIM) 300 MG tablet Take by mouth. 10/08/18   [provider]  ALPRAZolam Duanne Moron) 0.25 MG tablet Take 0.25 mg by mouth daily as needed for anxiety. 05/28/16   [provider]  B-D UF III MINI PEN NEEDLES  31G X 5 MM MISC  03/08/18   [provider]  benazepril (LOTENSIN) 10 MG tablet Take 10 mg by mouth daily. 05/09/16   [provider]  cephALEXin (KEFLEX) 500 MG capsule  12/08/18   [provider]  colchicine 0.6 MG tablet Take by mouth.    [provider]  diclofenac (VOLTAREN) 75 MG EC tablet Take by mouth.    [provider]  diclofenac Sodium (VOLTAREN) 1 % GEL  03/17/19   [provider]  diphenoxylate-atropine (LOMOTIL) 2.5-0.025 MG tablet TAKE 1 TABLET BY MOUTH EVERY 6 HOURS AS NEEDED FOR DIARRHEA 11/25/18   [provider]  DULoxetine  (CYMBALTA) 60 MG capsule Take 60 mg by mouth daily.  03/23/16   [provider]  ferrous sulfate 325 (65 FE) MG tablet Take by mouth.    [provider]  fluticasone (FLONASE) 50 MCG/ACT nasal spray USE 2 SPRAY(S) IN EACH NOSTRIL DAILY 09/23/17   [provider]  furosemide (LASIX) 40 MG tablet Take 1 tablet (40 mg total) by mouth daily. 06/18/16   Barton Dubois, MD  glimepiride (AMARYL) 4 MG tablet Take 4 mg by mouth 2 (two) times daily.  10/16/15   [provider]  Lancets (ONETOUCH DELICA PLUS [id number]) [place] 3 TIMES A DAY 01/17/18   [provider]  LYRICA 150 MG capsule  04/28/19   [provider]  LYRICA 200 MG capsule Take 200 mg by mouth 4 (four) times daily.  08/04/15   [provider]  meloxicam (MOBIC) 15 MG tablet TAKE 1 TABLET BY MOUTH ONCE DAILY FOR 30 DAYS 12/20/17   [provider]  metFORMIN (GLUCOPHAGE) 500 MG tablet Take 500 mg by mouth 2 (two) times daily.  02/28/15   [provider]  methylPREDNISolone (MEDROL DOSEPAK) 4 MG TBPK tablet  02/27/19   [provider]  metoprolol succinate (TOPROL-XL) 50 MG 24 hr tablet Take 50 mg by mouth daily.  12/28/15   [provider]  Multiple Vitamins-Minerals (BARIATRIC FUSION PO) Take 1 tablet by mouth daily.    [provider]  naloxone [facility]) 4 MG/0.1ML LIQD nasal spray kit PLEASE SEE ATTACHED FOR DETAILED DIRECTIONS 07/15/18   [provider]  nortriptyline (PAMELOR) 50 MG capsule TAKE 1 CAPSULE BY MOUTH DAILY AT BEDTIME 10/24/17   [provider]  NOVOLOG FLEXPEN 100 UNIT/ML FlexPen Inject 2-15 Units into the skin 3 (three) times daily with meals.  04/23/16   [provider]  olmesartan (BENICAR) 20 MG tablet Take 20 mg by mouth daily. 08/04/21   [provider]  Olopatadine HCl 0.2 % SOLN INSTILL 1 DROP IN [facility] EYE DAILY AS NEEDED 01/17/18   [provider]  omeprazole (PRILOSEC) 20  MG capsule Take 1 capsule (20 mg total) by mouth daily. 11/10/18 12/10/18  Epifanio Lesches, MD  ondansetron (ZOFRAN-ODT) 4 MG disintegrating tablet DISSOLVE 1 TABLET IN MOUTH EVERY 8 HOURS AS NEEDED FOR UP TO 7 DAYS FOR NAUSEA OR VOMITING 11/10/18   [provider]  Oxycodone HCl 10 MG TABS Take 10 mg by mouth every 8 (eight) hours as needed. 02/29/16   [provider]  pantoprazole (PROTONIX) 40 MG tablet  01/24/19   [provider]  simvastatin (ZOCOR) 40 MG tablet  03/01/19   [provider]  spironolactone (ALDACTONE) 25 MG tablet Take by mouth.    [provider]  tiZANidine (ZANAFLEX) 2 MG tablet TAKE 1 TABLET BY MOUTH AT BEDTIME AS NEEDED  FOR PAINS 12/05/17   [provider]  triamcinolone cream (KENALOG) 0.5 % APPLY CREAM TOPICALLY TO LEGS TWICE A DAY AS NEEDED 01/17/18   [provider]  venlafaxine XR (EFFEXOR-XR) 150 MG 24 hr capsule Take 150 mg by mouth daily with breakfast.    [provider]  VENTOLIN HFA 108 (90 Base) MCG/ACT inhaler  06/12/19   [provider]  Vitamin D, Ergocalciferol, (DRISDOL) 1.25 MG (50000 UNIT) CAPS capsule  05/23/19   [provider]  XULTOPHY 100-3.6 UNIT-MG/ML SOPN INJECT 20 UNITS SUBCUTANEOUSLY ONCE DAILY IN THE MORNING 11/07/17   [provider]  zolpidem (AMBIEN) 10 MG tablet Take 10 mg by mouth at bedtime as needed. 12/02/17   [provider]    Family History Family History  Problem Relation Age of Onset   Hypertension Mother    Hypertension Father    Breast cancer Sister        78s    Social History Social History   Tobacco Use   Smoking status: Former    Packs/day: 0.25    Years: 1.00    Total pack years: 0.25    Types: Cigarettes    Quit date: 03/06/2010    Years since quitting: 11.4   Smokeless tobacco: Never  Substance Use Topics   Alcohol use: No   Drug use: No     Allergies   Ampicillin, Penicillins, Doxycycline, and  Other   Review of Systems Review of Systems  Constitutional:  Positive for chills and fever. Negative for activity change, appetite change, diaphoresis, fatigue and unexpected weight change.  HENT:  Positive for congestion, postnasal drip, rhinorrhea, sinus pressure and sore throat. Negative for dental problem, drooling, ear discharge, ear pain, facial swelling, hearing loss, mouth sores, nosebleeds, sinus pain, sneezing, tinnitus, trouble swallowing and voice change.   Respiratory:  Positive for cough. Negative for apnea, choking, chest tightness, shortness of breath, wheezing and stridor.   Cardiovascular: Negative.   Gastrointestinal: Negative.   Musculoskeletal:  Positive for myalgias. Negative for arthralgias, back pain, gait problem, joint swelling, neck pain and neck stiffness.  Skin: Negative.   Neurological: Negative.      Physical Exam Triage Vital Signs ED Triage Vitals  Enc Vitals Group     BP 08/19/21 1430 (!) 156/72     Pulse Rate 08/19/21 1430 100     Resp 08/19/21 1430 16     Temp 08/19/21 1430 99.6 F (37.6 C)     Temp Source 08/19/21 1430 Oral     SpO2 08/19/21 1430 96 %     Weight --      Height --      Head Circumference --      Peak Flow --      Pain Score 08/19/21 1431 9     Pain Loc --      Pain Edu? --      Excl. in [place]? --    No data found.  Updated Vital Signs BP (!) 156/72 (BP Location: Left Arm)   Pulse 100   Temp 99.6 F (37.6 C) (Oral)   Resp 16   SpO2 96%   Visual Acuity Right Eye Distance:   Left Eye Distance:   Bilateral Distance:    Right Eye Near:   Left Eye Near:    Bilateral Near:     Physical Exam Constitutional:      Appearance: Normal appearance.  HENT:     Head: Normocephalic.     Right  Ear: Hearing, tympanic membrane, ear canal and external ear normal.     Left Ear: Hearing, tympanic membrane, ear canal and external ear normal.     Nose: Congestion and rhinorrhea present.     Right Turbinates: Swollen.     Left  Turbinates: Swollen.     Right Sinus: Maxillary sinus tenderness present. No frontal sinus tenderness.     Left Sinus: Maxillary sinus tenderness present. No frontal sinus tenderness.     Mouth/Throat:     Mouth: Mucous membranes are moist.     Pharynx: Posterior oropharyngeal erythema present.  Eyes:     Extraocular Movements: Extraocular movements intact.  Cardiovascular:     Rate and Rhythm: Normal rate and regular rhythm.     Pulses: Normal pulses.     Heart sounds: Normal heart sounds.  Pulmonary:     Effort: Pulmonary effort is normal.     Breath sounds: Normal breath sounds.  Musculoskeletal:     Cervical back: Normal range of motion and neck supple.  Skin:    General: Skin is warm and dry.  Neurological:     Mental Status: She is alert and oriented to person, place, and time. Mental status is at baseline.  Psychiatric:        Mood and Affect: Mood normal.        Behavior: Behavior normal.      UC Treatments / Results  Labs (all labs ordered are listed, but only abnormal results are displayed) Labs Reviewed - No data to display  EKG   Radiology No results found.  Procedures Procedures (including critical care time)  Medications Ordered in UC Medications - No data to display  Initial Impression / Assessment and Plan / UC Course  I have reviewed the triage vital signs and the nursing notes.  Pertinent labs & imaging results that were available during my care of the patient were reviewed by me and considered in my medical decision making (see chart for details).  Acute nonrecurrent maxillary sinusitis  Vital signs are stable, patient is in no signs of distress, COVID test pending, will start antiviral with positive, discussed with patient, prescribed Flonase and viscous lidocaine for management of sore throat and sinus pressure, recommended continued use of Mucinex, may use additional over-the-counter medications as needed for management and supportive care,  may follow-up with this urgent care as needed if symptoms persist in 10 days for evaluation for antibiotic coverage Final Clinical Impressions(s) / UC Diagnoses   Final diagnoses:  None   Discharge Instructions   None    ED Prescriptions   None    PDMP not reviewed this encounter.   Hans Eden, NP 08/19/21 1523

## 2021-08-19 NOTE — ED Triage Notes (Signed)
Pt states cough, congestion,sore throat and head pressure over her rt eye for the  past 3 days. States she had a fever on the first day.  Has been Mucinex with some relief.

## 2021-08-19 NOTE — Discharge Instructions (Signed)
Your symptoms today are consistent with a sinus infection, sinus infections are typically caused by viruses meaning we must give the body time to fight off the infection before attempting use of antibiotics  Begin use of Flonase twice daily, this medicine will help to reduce sinus pressure and reduce congestion  Gargle and spit lidocaine solution every 4 hours to give a temporary numbing effect to your throat    You can take Tylenol and/or Ibuprofen as needed for fever reduction and pain relief.   For cough: honey 1/2 to 1 teaspoon (you can dilute the honey in water or another fluid).  You can also use guaifenesin and dextromethorphan for cough. You can use a humidifier for chest congestion and cough.  If you don't have a humidifier, you can sit in the bathroom with the hot shower running.      For sore throat: try warm salt water gargles, cepacol lozenges, throat spray, warm tea or water with lemon/honey, popsicles or ice, or OTC cold relief medicine for throat discomfort.   For congestion: take a daily anti-histamine like Zyrtec, Claritin, and a oral decongestant, such as pseudoephedrine.  You can also use Flonase 1-2 sprays in each nostril daily.   It is important to stay hydrated: drink plenty of fluids (water, gatorade/powerade/pedialyte, juices, or teas) to keep your throat moisturized and help further relieve irritation/discomfort.  Symptoms continue to persist to 10 days with no signs of improvement please follow-up at urgent care or with primary doctor for evaluation for antibiotic

## 2021-08-20 LAB — SARS CORONAVIRUS 2 (TAT 6-24 HRS): SARS Coronavirus 2: NEGATIVE

## 2021-09-14 ENCOUNTER — Ambulatory Visit (HOSPITAL_COMMUNITY)
Admission: RE | Admit: 2021-09-14 | Discharge: 2021-09-14 | Disposition: A | Discharge status: Home / Self Care | Payer: Medicare Other | Source: Ambulatory Visit | Attending: Internal Medicine | Admitting: Internal Medicine

## 2021-09-14 ENCOUNTER — Other Ambulatory Visit (HOSPITAL_COMMUNITY): Payer: Self-pay | Admitting: Internal Medicine

## 2021-09-14 DIAGNOSIS — R600 Localized edema: Secondary | ICD-10-CM | POA: Insufficient documentation

## 2021-09-25 ENCOUNTER — Ambulatory Visit (INDEPENDENT_AMBULATORY_CARE_PROVIDER_SITE_OTHER): Payer: Medicare Other | Admitting: Ophthalmology

## 2021-09-25 ENCOUNTER — Encounter (INDEPENDENT_AMBULATORY_CARE_PROVIDER_SITE_OTHER): Payer: Self-pay | Admitting: Ophthalmology

## 2021-09-25 DIAGNOSIS — E113493 Type 2 diabetes mellitus with severe nonproliferative diabetic retinopathy without macular edema, bilateral: Secondary | ICD-10-CM

## 2021-09-25 DIAGNOSIS — H34831 Tributary (branch) retinal vein occlusion, right eye, with macular edema: Secondary | ICD-10-CM | POA: Diagnosis not present

## 2021-09-25 MED ORDER — BEVACIZUMAB 2.5 MG/0.1ML IZ SOSY
2.5000 mg | PREFILLED_SYRINGE | INTRAVITREAL | Status: AC | PRN
  Administered 2021-09-25: 2.5 mg via INTRAVITREAL

## 2021-09-25 NOTE — Assessment & Plan Note (Signed)
No sign of macular edema left eye

## 2021-09-25 NOTE — Assessment & Plan Note (Addendum)
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  We will repeat injection today and maintain 73-month follow-up interval today.

## 2021-09-25 NOTE — Progress Notes (Signed)
09/25/2021     CHIEF COMPLAINT Patient presents for  Chief Complaint  Patient presents with   Retina Evaluation   Diabetic Retinopathy with Macular Edema      HISTORY OF PRESENT ILLNESS: Lacey Meyer is a 61 y.o. female who presents to the clinic today for:   HPI   12 weeks dilate OU, Avastin OCT OD Pt states her vision has not been stable  Pt admits to new floaters in her right eye and some FOL in the left eye  Last edited by Morene Rankins, CMA on 09/25/2021 11:07 AM.      Referring physician: Nolene Ebbs, MD [place],  [place] [address]  HISTORICAL INFORMATION:   Selected notes from the MEDICAL RECORD NUMBER    Lab Results  Component Value Date   HGBA1C 10.1 (H) 11/07/2018     CURRENT MEDICATIONS: Current Outpatient Medications (Ophthalmic Drugs)  Medication Sig   Olopatadine HCl 0.2 % SOLN INSTILL 1 DROP IN [facility] EYE DAILY AS NEEDED   No current facility-administered medications for this visit. (Ophthalmic Drugs)   Current Outpatient Medications (Other)  Medication Sig   ACCU-CHEK GUIDE test strip USE TO TEST THREE TIMES DAILY (E11.65)   allopurinol (ZYLOPRIM) 300 MG tablet Take by mouth.   ALPRAZolam (XANAX) 0.25 MG tablet Take 0.25 mg by mouth daily as needed for anxiety.   B-D UF III MINI PEN NEEDLES 31G X 5 MM MISC    benazepril (LOTENSIN) 10 MG tablet Take 10 mg by mouth daily.   cephALEXin (KEFLEX) 500 MG capsule    colchicine 0.6 MG tablet Take by mouth.   diclofenac (VOLTAREN) 75 MG EC tablet Take by mouth.   diclofenac Sodium (VOLTAREN) 1 % GEL    diphenoxylate-atropine (LOMOTIL) 2.5-0.025 MG tablet TAKE 1 TABLET BY MOUTH EVERY 6 HOURS AS NEEDED FOR DIARRHEA   DULoxetine (CYMBALTA) 60 MG capsule Take 60 mg by mouth daily.    ferrous sulfate 325 (65 FE) MG tablet Take by mouth.   fluticasone (FLONASE) 50 MCG/ACT nasal spray Place 1 spray into both nostrils daily.   furosemide (LASIX) 40 MG tablet Take 1 tablet (40 mg  total) by mouth daily.   glimepiride (AMARYL) 4 MG tablet Take 4 mg by mouth 2 (two) times daily.    Lancets (ONETOUCH DELICA PLUS [id number]) MISC CHECK GLUCOSE 3 TIMES A DAY   lidocaine (XYLOCAINE) 2 % solution Use as directed 15 mLs in the mouth or throat as needed for mouth pain.   LYRICA 150 MG capsule    LYRICA 200 MG capsule Take 200 mg by mouth 4 (four) times daily.    meloxicam (MOBIC) 15 MG tablet TAKE 1 TABLET BY MOUTH ONCE DAILY FOR 30 DAYS   metFORMIN (GLUCOPHAGE) 500 MG tablet Take 500 mg by mouth 2 (two) times daily.    methylPREDNISolone (MEDROL DOSEPAK) 4 MG TBPK tablet    metoprolol succinate (TOPROL-XL) 50 MG 24 hr tablet Take 50 mg by mouth daily.    Multiple Vitamins-Minerals (BARIATRIC FUSION PO) Take 1 tablet by mouth daily.   naloxone (NARCAN) 4 MG/0.1ML LIQD nasal spray kit PLEASE SEE ATTACHED FOR DETAILED DIRECTIONS   nortriptyline (PAMELOR) 50 MG capsule TAKE 1 CAPSULE BY MOUTH DAILY AT BEDTIME   NOVOLOG FLEXPEN 100 UNIT/ML FlexPen Inject 2-15 Units into the skin 3 (three) times daily with meals.    olmesartan (BENICAR) 20 MG tablet Take 20 mg by mouth daily.   omeprazole (PRILOSEC) 20 MG capsule Take  1 capsule (20 mg total) by mouth daily.   ondansetron (ZOFRAN-ODT) 4 MG disintegrating tablet DISSOLVE 1 TABLET IN MOUTH EVERY 8 HOURS AS NEEDED FOR UP TO 7 DAYS FOR NAUSEA OR VOMITING   Oxycodone HCl 10 MG TABS Take 10 mg by mouth every 8 (eight) hours as needed.   pantoprazole (PROTONIX) 40 MG tablet    simvastatin (ZOCOR) 40 MG tablet    spironolactone (ALDACTONE) 25 MG tablet Take by mouth.   tiZANidine (ZANAFLEX) 2 MG tablet TAKE 1 TABLET BY MOUTH AT BEDTIME AS NEEDED FOR PAINS   triamcinolone cream (KENALOG) 0.5 % APPLY CREAM TOPICALLY TO LEGS TWICE A DAY AS NEEDED   venlafaxine XR (EFFEXOR-XR) 150 MG 24 hr capsule Take 150 mg by mouth daily with breakfast.   VENTOLIN HFA 108 (90 Base) MCG/ACT inhaler    Vitamin D, Ergocalciferol, (DRISDOL) 1.25 MG (50000 UNIT)  CAPS capsule    XULTOPHY 100-3.6 UNIT-MG/ML SOPN INJECT 20 UNITS SUBCUTANEOUSLY ONCE DAILY IN THE MORNING   zolpidem (AMBIEN) 10 MG tablet Take 10 mg by mouth at bedtime as needed.   No current facility-administered medications for this visit. (Other)      REVIEW OF SYSTEMS: ROS   Negative for: Constitutional, Gastrointestinal, Neurological, Skin, Genitourinary, Musculoskeletal, HENT, Endocrine, Cardiovascular, Eyes, Respiratory, Psychiatric, Allergic/Imm, Heme/Lymph Last edited by Hurman Horn, MD on 09/25/2021 11:37 AM.       ALLERGIES Allergies  Allergen Reactions   Ampicillin Anaphylaxis, Hives and Other (See Comments)    Has patient had a PCN reaction causing immediate rash, facial/tongue/throat swelling, SOB or lightheadedness with hypotension: Yes Has patient had a PCN reaction causing severe rash involving mucus membranes or skin necrosis: Yes Has patient had a PCN reaction that required hospitalization Yes Has patient had a PCN reaction occurring within the last 10 years: No If all of the above answers are "NO", then may proceed with Cephalosporin use.    Penicillins Anaphylaxis, Hives and Nausea And Vomiting    Has patient had a PCN reaction causing immediate rash, facial/tongue/throat swelling, SOB or lightheadedness with hypotension: Yes Has patient had a PCN reaction causing severe rash involving mucus membranes or skin necrosis: Yes Has patient had a PCN reaction that required hospitalization Yes Has patient had a PCN reaction occurring within the last 10 years: No If all of the above answers are "NO", then may proceed with Cephalosporin use. Has patient had a PCN reaction causing immediate rash, facial/tongue/throat swelling, SOB or lightheadedness with hypotension: Yes Has patient had a PCN reaction causing severe rash involving mucus membranes or skin necrosis: Yes Has patient had a PCN reaction that required hospitalization Yes Has patient had a PCN reaction  occurring within the last 10 years: No If all of the above answers are "NO", then may proceed with Cephalosporin use.   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   Past Surgical History:  Procedure Laterality Date   BTL     EYE SURGERY     cataract surgery / right eye  FOOT SURGERY     bilateral    LAPAROSCOPIC GASTRIC SLEEVE RESECTION N/A 01/03/2016   Procedure: LAPAROSCOPIC GASTRIC SLEEVE RESECTION WITH UPPER ENDO;  Surgeon: Alphonsa Overall, MD;  Location: [facility];  Service: General;  Laterality: N/A;   SHOULDER ARTHROSCOPY W/ ROTATOR CUFF REPAIR     left    TUBAL LIGATION      FAMILY HISTORY Family History  Problem Relation Age of Onset   Hypertension Mother    Hypertension Father    Breast cancer Sister        71s    SOCIAL HISTORY Social History   Tobacco Use   Smoking status: Former    Packs/day: 0.25    Years: 1.00    Total pack years: 0.25    Types: Cigarettes    Quit date: 03/06/2010    Years since quitting: 11.5   Smokeless tobacco: Never  Substance Use Topics   Alcohol use: No   Drug use: No         OPHTHALMIC EXAM:  Base Eye Exam     Visual Acuity (ETDRS)       Right Left   Dist [place] 20/125 20/100         Tonometry (Tonopen, 11:13 AM)       Right Left   Pressure 14 5         Neuro/Psych     Oriented x3: Yes   Mood/Affect: Normal         Dilation     Both eyes: 1.0% Mydriacyl, 2.5% Phenylephrine @ 11:08 AM           Slit Lamp and Fundus Exam     External Exam       Right Left   External Normal Normal          Slit Lamp Exam       Right Left   Lids/Lashes Normal Normal   Conjunctiva/Sclera White and quiet White and quiet   Cornea Clear Clear   Anterior Chamber Deep and quiet Deep and quiet   Iris Round and reactive Round and reactive   Lens Posterior chamber intraocular lens, 2+ Posterior capsular opacification centrally  Posterior chamber intraocular lens, Centered posterior chamber intraocular lens   Anterior Vitreous Normal Normal         Fundus Exam       Right Left   Posterior Vitreous ,, Posterior vitreous detachment ,, Posterior vitreous detachment   Disc Normal Normal,, watch nerve possible small area of collaterals and or neovascularization inferior OS   C/D Ratio 0.3 0.4   Macula Retinal pigment epithelial mottling, no CME clinically Retinal pigment epithelial mottling   Vessels Old BRVO  collateralization along the superotemporal margin of the nerve NPDR- Moderate   Periphery Normal, good sector prp sup Normal            IMAGING AND PROCEDURES  Imaging and Procedures for 09/25/21  OCT, Retina - OU - Both Eyes       Right Eye Quality was good. Scan locations included subfoveal. Central Foveal Thickness: 190. Progression has improved. Findings include abnormal foveal contour, cystoid macular edema, vitreomacular adhesion .   Left Eye Quality was good. Scan locations included nasal. Central Foveal Thickness: 175. Progression has been stable. Findings include abnormal foveal contour.   Notes Improved CME OD 12 weeks post injection for BRVO.  Will repeat injection today to maintain, still stable OD maintain interval next to 12 weeks  OS diffuse retinal atrophy no  active maculopathy     Intravitreal Injection, Pharmacologic Agent - OD - Right Eye       Time Out 09/25/2021. 11:40 AM. Confirmed correct patient, procedure, site, and patient consented.   Anesthesia Topical anesthesia was used. Anesthetic medications included Lidocaine 4%.   Procedure Preparation  included 5% betadine to ocular surface, 10% betadine to eyelids, Tobramycin 0.3%, Ofloxacin . A 30 gauge needle was used.   Injection: 2.5 mg bevacizumab 2.5 MG/0.1ML   Route: Intravitreal, Site: Right Eye   NDC: [phone], Lot: [id number], Expiration date: 11/10/2021   Post-op Post injection exam found visual acuity of at least counting fingers. The patient tolerated the procedure well. There were no complications. The patient received written and verbal post procedure care education. Post injection medications included ocuflox.              ASSESSMENT/PLAN:  Branch retinal vein occlusion with macular edema of right eye No sign of recurrent massive CME which had developed some 2 years previous.  We will repeat injection today and maintain 47-monthfollow-up interval today.  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 Branch retinal vein occlusion with macular edema of right eye  H34.8310 OCT, Retina - OU - Both Eyes    Intravitreal Injection, Pharmacologic Agent - OD - Right Eye    bevacizumab (AVASTIN) SOSY 2.5 mg    2. 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  NPDR OU stable overall.  2.  Macular BRVO chronically active temporally.  OD.  No sign of recurrence yet stable at 12-week follow-up.  Reevaluate next in 3 months after injection Avastin OD today  3.  Ophthalmic Meds Ordered this visit:  Meds ordered this encounter  Medications   bevacizumab (AVASTIN) SOSY 2.5 mg       Return in about 3 months (around 12/26/2021) for DILATE OU, AVASTIN OCT, OD.  There are no Patient Instructions on file for this visit.   Explained the diagnoses, plan, and follow up with the patient and they expressed understanding.  Patient expressed  understanding of the importance of proper follow up care.   GClent DemarkRankin M.D. Diseases & Surgery of the Retina and Vitreous Retina & Diabetic [place]07/17/23     Abbreviations: M myopia (nearsighted); A astigmatism; H hyperopia (farsighted); P presbyopia; Mrx spectacle prescription;  CTL contact lenses; OD right eye; OS left eye; OU both eyes  XT exotropia; ET esotropia; PEK punctate epithelial keratitis; PEE punctate epithelial erosions; DES dry eye syndrome; MGD meibomian gland dysfunction; ATs artificial tears; PFAT's preservative free artificial tears; [place] sclerotic cataract; PSC posterior subcapsular cataract; ERM epi-retinal membrane; PVD posterior vitreous detachment; RD retinal detachment; DM diabetes mellitus; DR diabetic retinopathy; NPDR non-proliferative diabetic retinopathy; PDR proliferative diabetic retinopathy; CSME clinically significant macular edema; DME diabetic macular edema; dbh dot blot hemorrhages; CWS cotton wool spot; POAG primary open angle glaucoma; C/D cup-to-disc ratio; HVF humphrey visual field; GVF goldmann visual field; OCT optical coherence tomography; IOP intraocular pressure; BRVO Branch retinal vein occlusion; CRVO central retinal vein occlusion; CRAO central retinal artery occlusion; BRAO branch retinal artery occlusion; RT retinal tear; SB scleral buckle; PPV pars plana vitrectomy; VH Vitreous hemorrhage; PRP panretinal laser photocoagulation; IVK intravitreal kenalog; VMT vitreomacular traction; MH Macular  hole;  NVD neovascularization of the disc; NVE neovascularization elsewhere; AREDS age related eye disease study; ARMD age related macular degeneration; POAG primary open angle glaucoma; EBMD epithelial/anterior basement membrane dystrophy; ACIOL anterior chamber intraocular lens; IOL intraocular lens; PCIOL posterior chamber intraocular lens; Phaco/IOL phacoemulsification with intraocular lens placement; [place] photorefractive keratectomy; LASIK laser  assisted in situ keratomileusis; HTN hypertension; DM diabetes mellitus; COPD chronic obstructive pulmonary disease

## 2021-10-16 ENCOUNTER — Ambulatory Visit (INDEPENDENT_AMBULATORY_CARE_PROVIDER_SITE_OTHER): Payer: Medicare Other | Admitting: Podiatry

## 2021-10-16 ENCOUNTER — Encounter: Payer: Self-pay | Admitting: Podiatry

## 2021-10-16 DIAGNOSIS — M79674 Pain in right toe(s): Secondary | ICD-10-CM | POA: Diagnosis not present

## 2021-10-16 DIAGNOSIS — M79675 Pain in left toe(s): Secondary | ICD-10-CM | POA: Diagnosis not present

## 2021-10-16 DIAGNOSIS — E1140 Type 2 diabetes mellitus with diabetic neuropathy, unspecified: Secondary | ICD-10-CM

## 2021-10-16 DIAGNOSIS — B351 Tinea unguium: Secondary | ICD-10-CM

## 2021-10-16 DIAGNOSIS — Z794 Long term (current) use of insulin: Secondary | ICD-10-CM

## 2021-10-16 DIAGNOSIS — M2012 Hallux valgus (acquired), left foot: Secondary | ICD-10-CM

## 2021-10-16 NOTE — Progress Notes (Signed)
This patient returns to my office for at risk foot care.  This patient requires this care by a professional since this patient will be at risk due to having diabetes. She presents to the office with her husband.  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 This patient is unable to cut nails herself since the patient cannot reach her nails.These nails are painful walking and wearing shoes.  This patient presents for at risk foot care today.  General Appearance  Alert, conversant and in no acute stress.  Vascular  Dorsalis pedis and posterior tibial  pulses are palpable  bilaterally.  Capillary return is within normal limits  bilaterally. Temperature is within normal limits  bilaterally.  Neurologic  Senn-Weinstein monofilament wire test diminished  bilaterally. Muscle power within normal limits bilaterally.  Nails Thick disfigured discolored nails with subungual debris  from hallux to fifth toes bilaterally. No evidence of bacterial infection or drainage bilaterally.  Orthopedic  No limitations of motion  feet .  No crepitus or effusions noted.  No bony pathology or digital deformities noted.  Skin  normotropic skin with no porokeratosis noted bilaterally.  No signs of infections or ulcers noted.     Onychomycosis  Pain in right toes  Pain in left toes  Consent was obtained for treatment procedures.   Mechanical debridement of nails 1-5  bilaterally performed with a nail nipper.  Filed with dremel without incident.   Return office visit   3 months                   Told patient to return for periodic foot care and evaluation due to potential at risk complications.   Ceola Para DPM   

## 2021-11-17 ENCOUNTER — Other Ambulatory Visit: Payer: Self-pay | Admitting: *Deleted

## 2021-11-17 DIAGNOSIS — M7989 Other specified soft tissue disorders: Secondary | ICD-10-CM

## 2021-11-24 ENCOUNTER — Ambulatory Visit (HOSPITAL_COMMUNITY)
Admission: RE | Admit: 2021-11-24 | Discharge: 2021-11-24 | Disposition: A | Discharge status: Home / Self Care | Payer: Medicare Other | Source: Ambulatory Visit | Attending: Vascular Surgery | Admitting: Vascular Surgery

## 2021-11-24 ENCOUNTER — Ambulatory Visit (INDEPENDENT_AMBULATORY_CARE_PROVIDER_SITE_OTHER): Payer: Medicare Other | Admitting: Physician Assistant

## 2021-11-24 VITALS — BP 170/77 | HR 102 | Temp 98.1°F | Resp 16 | Ht 65.0 in | Wt 304.0 lb

## 2021-11-24 DIAGNOSIS — M7989 Other specified soft tissue disorders: Secondary | ICD-10-CM

## 2021-11-24 NOTE — Progress Notes (Signed)
Office Note     CC:  follow up Requesting Provider:  Nolene Ebbs, MD  HPI: Lacey Meyer is a 62 y.o. (30-Sep-1960) female who presents with bilateral lower extremity edema.  Patient states edema of her lower legs has increased over the past several weeks to months.  She denies any history of DVT, venous ulcerations, trauma, or prior vascular interventions.  She also denies any CHF or kidney disease.  She does not wear any compression or make an effort to elevate her legs during the day.  She admittedly has been sitting around more so than usual.  She is a former cigarette smoker.  She ambulates with a cane.   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    Past Surgical History:  Procedure Laterality Date   BTL     EYE SURGERY     cataract surgery / right eye    FOOT SURGERY     bilateral    LAPAROSCOPIC GASTRIC SLEEVE RESECTION N/A 01/03/2016   Procedure: LAPAROSCOPIC GASTRIC SLEEVE RESECTION WITH UPPER ENDO;  Surgeon: Alphonsa Overall, MD;  Location: [facility];  Service: General;  Laterality: N/A;   SHOULDER ARTHROSCOPY W/ ROTATOR CUFF REPAIR     left    TUBAL LIGATION      Social History   Socioeconomic History   Marital status: Single    Spouse name: Not on file   Number of children: Not on file   Years of education: Not on file   Highest education level: Not on file  Occupational History   Not on file  Tobacco Use   Smoking status: Former    Packs/day: 0.25    Years: 1.00    Total pack years: 0.25    Types: Cigarettes    Quit date: 03/06/2010    Years since quitting: 11.7   Smokeless tobacco: Never  Substance and Sexual Activity   Alcohol use: No   Drug use: No   Sexual activity: Not on file  Other Topics Concern   Not on  file  Social History Narrative   Not on file   Social Determinants of Health   Financial Resource Strain: Not on file  Food Insecurity: Not on file  Transportation Needs: Not on file  Physical Activity: Not on file  Stress: Not on file  Social Connections: Not on file  Intimate Partner Violence: Not on file    Family History  Problem Relation Age of Onset   Hypertension Mother    Hypertension Father    Breast cancer Sister        9s    Current Outpatient Medications  Medication Sig Dispense Refill   ACCU-CHEK GUIDE test strip USE TO TEST THREE TIMES DAILY (E11.65)     allopurinol (ZYLOPRIM) 300 MG tablet Take by mouth.     ALPRAZolam (XANAX) 0.25 MG tablet Take 0.25 mg by mouth daily as needed for anxiety.     B-D UF III MINI PEN NEEDLES 31G X 5 MM MISC      cephALEXin (KEFLEX) 500 MG capsule      colchicine 0.6 MG tablet Take by mouth.     diclofenac (VOLTAREN) 75 MG EC tablet Take by mouth.  diclofenac Sodium (VOLTAREN) 1 % GEL      diphenoxylate-atropine (LOMOTIL) 2.5-0.025 MG tablet TAKE 1 TABLET BY MOUTH EVERY 6 HOURS AS NEEDED FOR DIARRHEA     DULoxetine (CYMBALTA) 60 MG capsule Take 60 mg by mouth daily.     ferrous sulfate 325 (65 FE) MG tablet Take by mouth.     fluticasone (FLONASE) 50 MCG/ACT nasal spray Place 1 spray into both nostrils daily. 9.9 mL 2   furosemide (LASIX) 40 MG tablet Take 1 tablet (40 mg total) by mouth daily.     glimepiride (AMARYL) 4 MG tablet Take 4 mg by mouth 2 (two) times daily.      Lancets (ONETOUCH DELICA PLUS [id number]) MISC CHECK GLUCOSE 3 TIMES A DAY  2   lidocaine (XYLOCAINE) 2 % solution Use as directed 15 mLs in the mouth or throat as needed for mouth pain. 100 mL 0   LYRICA 150 MG capsule      LYRICA 200 MG capsule Take 200 mg by mouth 4 (four) times daily.      meloxicam (MOBIC) 15 MG tablet TAKE 1 TABLET BY MOUTH ONCE DAILY FOR 30 DAYS     metFORMIN (GLUCOPHAGE) 500 MG tablet Take 500 mg by mouth 2 (two) times daily.       methylPREDNISolone (MEDROL DOSEPAK) 4 MG TBPK tablet      metoprolol succinate (TOPROL-XL) 50 MG 24 hr tablet Take 50 mg by mouth daily.      Multiple Vitamins-Minerals (BARIATRIC FUSION PO) Take 1 tablet by mouth daily.     naloxone (NARCAN) 4 MG/0.1ML LIQD nasal spray kit PLEASE SEE ATTACHED FOR DETAILED DIRECTIONS     nortriptyline (PAMELOR) 50 MG capsule TAKE 1 CAPSULE BY MOUTH DAILY AT BEDTIME     NOVOLOG FLEXPEN 100 UNIT/ML FlexPen Inject 2-15 Units into the skin 3 (three) times daily with meals.      olmesartan (BENICAR) 20 MG tablet Take 20 mg by mouth daily.     Olopatadine HCl 0.2 % SOLN INSTILL 1 DROP IN EACH EYE DAILY AS NEEDED  2   ondansetron (ZOFRAN-ODT) 4 MG disintegrating tablet DISSOLVE 1 TABLET IN MOUTH EVERY 8 HOURS AS NEEDED FOR UP TO 7 DAYS FOR NAUSEA OR VOMITING     Oxycodone HCl 10 MG TABS Take 10 mg by mouth every 8 (eight) hours as needed.     pantoprazole (PROTONIX) 40 MG tablet      simvastatin (ZOCOR) 40 MG tablet      spironolactone (ALDACTONE) 25 MG tablet Take by mouth.     tiZANidine (ZANAFLEX) 2 MG tablet TAKE 1 TABLET BY MOUTH AT BEDTIME AS NEEDED FOR PAINS  2   triamcinolone cream (KENALOG) 0.5 % APPLY CREAM TOPICALLY TO LEGS TWICE A DAY AS NEEDED  2   venlafaxine XR (EFFEXOR-XR) 150 MG 24 hr capsule Take 150 mg by mouth daily with breakfast.     VENTOLIN HFA 108 (90 Base) MCG/ACT inhaler      Vitamin D, Ergocalciferol, (DRISDOL) 1.25 MG (50000 UNIT) CAPS capsule      XULTOPHY 100-3.6 UNIT-MG/ML SOPN INJECT 20 UNITS SUBCUTANEOUSLY ONCE DAILY IN THE MORNING  5   zolpidem (AMBIEN) 10 MG tablet Take 10 mg by mouth at bedtime as needed.  5   benazepril (LOTENSIN) 10 MG tablet Take 10 mg by mouth daily. (Patient not taking: Reported on 11/24/2021)     omeprazole (PRILOSEC) 20 MG capsule Take 1 capsule (20 mg total) by mouth daily. 30 capsule 0  No current facility-administered medications for this visit.    Allergies  Allergen Reactions   Ampicillin  Anaphylaxis, Hives and Other (See Comments)    Has patient had a PCN reaction causing immediate rash, facial/tongue/throat swelling, SOB or lightheadedness with hypotension: Yes Has patient had a PCN reaction causing severe rash involving mucus membranes or skin necrosis: Yes Has patient had a PCN reaction that required hospitalization Yes Has patient had a PCN reaction occurring within the last 10 years: No If all of the above answers are "NO", then may proceed with Cephalosporin use.    Penicillins Anaphylaxis, Hives and Nausea And Vomiting    Has patient had a PCN reaction causing immediate rash, facial/tongue/throat swelling, SOB or lightheadedness with hypotension: Yes Has patient had a PCN reaction causing severe rash involving mucus membranes or skin necrosis: Yes Has patient had a PCN reaction that required hospitalization Yes Has patient had a PCN reaction occurring within the last 10 years: No If all of the above answers are "NO", then may proceed with Cephalosporin use. Has patient had a PCN reaction causing immediate rash, facial/tongue/throat swelling, SOB or lightheadedness with hypotension: Yes Has patient had a PCN reaction causing severe rash involving mucus membranes or skin necrosis: Yes Has patient had a PCN reaction that required hospitalization Yes Has patient had a PCN reaction occurring within the last 10 years: No If all of the above answers are "NO", then may proceed with Cephalosporin use.   Doxycycline Other (See Comments)    unknown unknown unknown   Other Swelling, Rash and Other (See Comments)    Blistex Blistex Blistex     REVIEW OF SYSTEMS:   _0  denotes positive finding, _1  denotes negative finding Cardiac  Comments:  Chest pain or chest pressure:    Shortness of breath upon exertion:    Short of breath when lying flat:    Irregular heart rhythm:        Vascular    Pain in calf, thigh, or hip brought on by ambulation:    Pain in feet at night  that wakes you up from your sleep:     Blood clot in your veins:    Leg swelling:         Pulmonary    Oxygen at home:    Productive cough:     Wheezing:         Neurologic    Sudden weakness in arms or legs:     Sudden numbness in arms or legs:     Sudden onset of difficulty speaking or slurred speech:    Temporary loss of vision in one eye:     Problems with dizziness:         Gastrointestinal    Blood in stool:     Vomited blood:         Genitourinary    Burning when urinating:     Blood in urine:        Psychiatric    Major depression:         Hematologic    Bleeding problems:    Problems with blood clotting too easily:        Skin    Rashes or ulcers:        Constitutional    Fever or chills:      PHYSICAL EXAMINATION:  Vitals:   11/24/21 1300  BP: (!) 170/77  Pulse: (!) 102  Resp: 16  Temp: 98.1 F (36.7 C)  TempSrc: Temporal  SpO2: 98%  Weight: (!) 304 lb (137.9 kg)  Height: _0  (1.651 m)    General:  WDWN in NAD; vital signs documented above Gait: Not observed HENT: WNL, normocephalic Pulmonary: normal non-labored breathing , without Rales, rhonchi,  wheezing Cardiac: regular HR Abdomen: soft, NT, no masses Skin: without rashes Vascular Exam/Pulses:  Right Left  Radial 2+ (normal) 2+ (normal)  DP 1+ (weak) 2+ (normal)   Extremities: Pitting edema of bilateral lower extremities to the level of the proximal shin; stasis pigmentation changes of bilateral lower legs with brawny induration of bilateral medial lower legs Musculoskeletal: no muscle wasting or atrophy  Neurologic: A&O X 3;  No focal weakness or paresthesias are detected Psychiatric:  The pt has Normal affect.   Non-Invasive Vascular Imaging:   Left lower extremity venous reflux study negative for DVT, negative for deep venous reflux, negative for superficial venous reflux    ASSESSMENT/PLAN:: 61 y.o. female here for evaluation of bilateral lower extremity edema  -Patient  reports an increase in the degree of edema of bilateral lower extremities over the past several weeks to months.  She admittedly has not been as mobile as usual.  Venous reflux study of the left lower extremity was negative for DVT, deep venous reflux, and superficial venous reflux.  Patient also states she has no history of CHF or kidney disease.  Given physical exam findings of pigmentation changes and induration of the skin, she likely has had chronic edema of bilateral lower extremities.  Given the skin changes at her medial ankle she is at high risk for developing venous ulcerations.  I stressed the importance of wearing compression 15 to 20 mmHg knee-high socks on a daily basis.  We also reviewed proper leg elevation which should be performed periodically throughout the day.  She has to make an effort to avoid prolonged sitting and standing.  Should she develop blistering, weeping, or ulceration she should return for Unna boot placement and referral to wound clinic.  She will otherwise follow-up on an as-needed basis.   Dagoberto Ligas, PA-C [facility] [phone]  Clinic MD:   Virl Cagey

## 2021-12-26 ENCOUNTER — Encounter (INDEPENDENT_AMBULATORY_CARE_PROVIDER_SITE_OTHER): Payer: Medicare Other | Admitting: Ophthalmology

## 2022-01-19 ENCOUNTER — Ambulatory Visit (INDEPENDENT_AMBULATORY_CARE_PROVIDER_SITE_OTHER): Payer: Medicare Other | Admitting: Podiatry

## 2022-01-19 ENCOUNTER — Encounter: Payer: Self-pay | Admitting: Podiatry

## 2022-01-19 DIAGNOSIS — M79674 Pain in right toe(s): Secondary | ICD-10-CM | POA: Diagnosis not present

## 2022-01-19 DIAGNOSIS — B351 Tinea unguium: Secondary | ICD-10-CM | POA: Diagnosis not present

## 2022-01-19 DIAGNOSIS — M2012 Hallux valgus (acquired), left foot: Secondary | ICD-10-CM

## 2022-01-19 DIAGNOSIS — M79675 Pain in left toe(s): Secondary | ICD-10-CM | POA: Diagnosis not present

## 2022-01-19 DIAGNOSIS — E1140 Type 2 diabetes mellitus with diabetic neuropathy, unspecified: Secondary | ICD-10-CM | POA: Diagnosis not present

## 2022-01-19 DIAGNOSIS — Z794 Long term (current) use of insulin: Secondary | ICD-10-CM

## 2022-01-19 NOTE — Progress Notes (Signed)
This patient returns to my office for at risk foot care.  This patient requires this care by a professional since this patient will be at risk due to having diabetes. She presents to the office with her husband.  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 This patient is unable to cut nails herself since the patient cannot reach her nails.These nails are painful walking and wearing shoes.  This patient presents for at risk foot care today.  General Appearance  Alert, conversant and in no acute stress.  Vascular  Dorsalis pedis and posterior tibial  pulses are palpable  bilaterally.  Capillary return is within normal limits  bilaterally. Temperature is within normal limits  bilaterally.  Neurologic  Senn-Weinstein monofilament wire test diminished  bilaterally. Muscle power within normal limits bilaterally.  Nails Thick disfigured discolored nails with subungual debris  from hallux to fifth toes bilaterally. No evidence of bacterial infection or drainage bilaterally.  Orthopedic  No limitations of motion  feet .  No crepitus or effusions noted.  No bony pathology or digital deformities noted.  Skin  normotropic skin with no porokeratosis noted bilaterally.  No signs of infections or ulcers noted.     Onychomycosis  Pain in right toes  Pain in left toes  Consent was obtained for treatment procedures.   Mechanical debridement of nails 1-5  bilaterally performed with a nail nipper.  Filed with dremel without incident.   Return office visit   3 months                   Told patient to return for periodic foot care and evaluation due to potential at risk complications.   Helane Gunther DPM

## 2022-02-28 ENCOUNTER — Other Ambulatory Visit: Payer: Self-pay | Admitting: Internal Medicine

## 2022-03-02 LAB — URINE CULTURE
MICRO NUMBER:: 14340921
SPECIMEN QUALITY:: ADEQUATE

## 2022-05-04 ENCOUNTER — Encounter: Payer: Self-pay | Admitting: Podiatry

## 2022-05-04 ENCOUNTER — Ambulatory Visit (INDEPENDENT_AMBULATORY_CARE_PROVIDER_SITE_OTHER): Payer: Medicare HMO | Admitting: Podiatry

## 2022-05-04 DIAGNOSIS — Z794 Long term (current) use of insulin: Secondary | ICD-10-CM

## 2022-05-04 DIAGNOSIS — B351 Tinea unguium: Secondary | ICD-10-CM | POA: Diagnosis not present

## 2022-05-04 DIAGNOSIS — E1140 Type 2 diabetes mellitus with diabetic neuropathy, unspecified: Secondary | ICD-10-CM | POA: Diagnosis not present

## 2022-05-04 DIAGNOSIS — M79675 Pain in left toe(s): Secondary | ICD-10-CM

## 2022-05-04 DIAGNOSIS — M79674 Pain in right toe(s): Secondary | ICD-10-CM

## 2022-05-04 NOTE — Progress Notes (Signed)
This patient returns to my office for at risk foot care.  This patient requires this care by a professional since this patient will be at risk due to having diabetes. She presents to the office with her husband.  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 This patient is unable to cut nails herself since the patient cannot reach her nails.These nails are painful walking and wearing shoes.  This patient presents for at risk foot care today.  General Appearance  Alert, conversant and in no acute stress.  Vascular  Dorsalis pedis pulses are palpable  bilaterally.  Posterior tibial pulses ae absent due to swelling.  Capillary return is within normal limits  bilaterally. Temperature is within normal limits  bilaterally.Absent hair on digits.  Neurologic  Senn-Weinstein monofilament wire test diminished  bilaterally. Muscle power within normal limits bilaterally.  Nails Thick disfigured discolored nails with subungual debris  from hallux to fifth toes bilaterally. No evidence of bacterial infection or drainage bilaterally.  Orthopedic  No limitations of motion  feet .  No crepitus or effusions noted.  No bony pathology or digital deformities noted.  Skin  normotropic skin with no porokeratosis noted bilaterally.  No signs of infections or ulcers noted.     Onychomycosis  Pain in right toes  Pain in left toes  Consent was obtained for treatment procedures.   Mechanical debridement of nails 1-5  bilaterally performed with a nail nipper.  Filed with dremel without incident.   Return office visit   3 months                   Told patient to return for periodic foot care and evaluation due to potential at risk complications.   Gardiner Barefoot DPM

## 2022-08-03 ENCOUNTER — Encounter: Payer: Self-pay | Admitting: Podiatry

## 2022-08-03 ENCOUNTER — Encounter (HOSPITAL_COMMUNITY): Payer: Self-pay | Admitting: *Deleted

## 2022-08-03 ENCOUNTER — Ambulatory Visit (INDEPENDENT_AMBULATORY_CARE_PROVIDER_SITE_OTHER): Payer: Medicare HMO | Admitting: Podiatry

## 2022-08-03 DIAGNOSIS — M79674 Pain in right toe(s): Secondary | ICD-10-CM

## 2022-08-03 DIAGNOSIS — Z794 Long term (current) use of insulin: Secondary | ICD-10-CM | POA: Diagnosis not present

## 2022-08-03 DIAGNOSIS — E1140 Type 2 diabetes mellitus with diabetic neuropathy, unspecified: Secondary | ICD-10-CM

## 2022-08-03 DIAGNOSIS — M79675 Pain in left toe(s): Secondary | ICD-10-CM

## 2022-08-03 DIAGNOSIS — B351 Tinea unguium: Secondary | ICD-10-CM | POA: Diagnosis not present

## 2022-08-03 NOTE — Progress Notes (Signed)
This patient returns to my office for at risk foot care.  This patient requires this care by a professional since this patient will be at risk due to having diabetes. She presents to the office with her husband.  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 This patient is unable to cut nails herself since the patient cannot reach her nails.These nails are painful walking and wearing shoes.  This patient presents for at risk foot care today.  General Appearance  Alert, conversant and in no acute stress.  Vascular  Dorsalis pedis pulses are palpable  bilaterally.  Posterior tibial pulses ae absent due to swelling.  Capillary return is within normal limits  bilaterally. Temperature is within normal limits  bilaterally.Absent hair on digits.  Neurologic  Senn-Weinstein monofilament wire test diminished  bilaterally. Muscle power within normal limits bilaterally.  Nails Thick disfigured discolored nails with subungual debris  from hallux to fifth toes bilaterally. No evidence of bacterial infection or drainage bilaterally.  Orthopedic  No limitations of motion  feet .  No crepitus or effusions noted.  No bony pathology or digital deformities noted.  Skin  normotropic skin with no porokeratosis noted bilaterally.  No signs of infections or ulcers noted.     Onychomycosis  Pain in right toes  Pain in left toes  Consent was obtained for treatment procedures.   Mechanical debridement of nails 1-5  bilaterally performed with a nail nipper.  Filed with dremel without incident.   Return office visit   3 months                   Told patient to return for periodic foot care and evaluation due to potential at risk complications.   Lawrnce Reyez DPM   

## 2022-11-02 ENCOUNTER — Encounter: Payer: Self-pay | Admitting: Podiatry

## 2022-11-02 ENCOUNTER — Ambulatory Visit (INDEPENDENT_AMBULATORY_CARE_PROVIDER_SITE_OTHER): Payer: Medicare HMO | Admitting: Podiatry

## 2022-11-02 DIAGNOSIS — M79675 Pain in left toe(s): Secondary | ICD-10-CM

## 2022-11-02 DIAGNOSIS — B351 Tinea unguium: Secondary | ICD-10-CM | POA: Diagnosis not present

## 2022-11-02 DIAGNOSIS — M79674 Pain in right toe(s): Secondary | ICD-10-CM

## 2022-11-02 DIAGNOSIS — E1140 Type 2 diabetes mellitus with diabetic neuropathy, unspecified: Secondary | ICD-10-CM | POA: Diagnosis not present

## 2022-11-02 DIAGNOSIS — Z794 Long term (current) use of insulin: Secondary | ICD-10-CM | POA: Diagnosis not present

## 2022-11-02 NOTE — Progress Notes (Signed)
This patient returns to my office for at risk foot care.  This patient requires this care by a professional since this patient will be at risk due to having diabetes. She presents to the office with her husband.  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 This patient is unable to cut nails herself since the patient cannot reach her nails.These nails are painful walking and wearing shoes.  This patient presents for at risk foot care today.  General Appearance  Alert, conversant and in no acute stress.  Vascular  Dorsalis pedis pulses are palpable  bilaterally.  Posterior tibial pulses ae absent due to swelling.  Capillary return is within normal limits  bilaterally. Temperature is within normal limits  bilaterally.Absent hair on digits.  Neurologic  Senn-Weinstein monofilament wire test diminished  bilaterally. Muscle power within normal limits bilaterally.  Nails Thick disfigured discolored nails with subungual debris  from hallux to fifth toes bilaterally. No evidence of bacterial infection or drainage bilaterally.  Orthopedic  No limitations of motion  feet .  No crepitus or effusions noted.  No bony pathology or digital deformities noted.  Skin  normotropic skin with no porokeratosis noted bilaterally.  No signs of infections or ulcers noted.     Onychomycosis  Pain in right toes  Pain in left toes  Consent was obtained for treatment procedures.   Mechanical debridement of nails 1-5  bilaterally performed with a nail nipper.  Filed with dremel without incident.   Return office visit   3 months                   Told patient to return for periodic foot care and evaluation due to potential at risk complications.   Gardiner Barefoot DPM

## 2022-12-10 NOTE — Progress Notes (Signed)
No charge. 

## 2023-02-04 ENCOUNTER — Ambulatory Visit (INDEPENDENT_AMBULATORY_CARE_PROVIDER_SITE_OTHER): Payer: Medicare HMO | Admitting: Podiatry

## 2023-02-04 ENCOUNTER — Encounter: Payer: Self-pay | Admitting: Podiatry

## 2023-02-04 DIAGNOSIS — B351 Tinea unguium: Secondary | ICD-10-CM | POA: Diagnosis not present

## 2023-02-04 DIAGNOSIS — M79675 Pain in left toe(s): Secondary | ICD-10-CM | POA: Diagnosis not present

## 2023-02-04 DIAGNOSIS — E1140 Type 2 diabetes mellitus with diabetic neuropathy, unspecified: Secondary | ICD-10-CM | POA: Diagnosis not present

## 2023-02-04 DIAGNOSIS — Z794 Long term (current) use of insulin: Secondary | ICD-10-CM

## 2023-02-04 DIAGNOSIS — M79674 Pain in right toe(s): Secondary | ICD-10-CM

## 2023-02-04 NOTE — Progress Notes (Signed)
This patient returns to my office for at risk foot care.  This patient requires this care by a professional since this patient will be at risk due to having diabetes. She presents to the office with her husband.  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 This patient is unable to cut nails herself since the patient cannot reach her nails.These nails are painful walking and wearing shoes.  This patient presents for at risk foot care today.  General Appearance  Alert, conversant and in no acute stress.  Vascular  Dorsalis pedis pulses are palpable  bilaterally.  Posterior tibial pulses ae absent due to swelling.  Capillary return is within normal limits  bilaterally. Temperature is within normal limits  bilaterally.Absent hair on digits.  Neurologic  Senn-Weinstein monofilament wire test diminished  bilaterally. Muscle power within normal limits bilaterally.  Nails Thick disfigured discolored nails with subungual debris  from hallux to fifth toes bilaterally. No evidence of bacterial infection or drainage bilaterally.  Orthopedic  No limitations of motion  feet .  No crepitus or effusions noted.  No bony pathology or digital deformities noted.  Skin  normotropic skin with no porokeratosis noted bilaterally.  No signs of infections or ulcers noted.     Onychomycosis  Pain in right toes  Pain in left toes  Consent was obtained for treatment procedures.   Mechanical debridement of nails 1-5  bilaterally performed with a nail nipper.  Filed with dremel without incident.   Return office visit   3 months                   Told patient to return for periodic foot care and evaluation due to potential at risk complications.   Helane Gunther DPM

## 2023-05-06 ENCOUNTER — Encounter: Payer: Self-pay | Admitting: Podiatry

## 2023-05-06 ENCOUNTER — Ambulatory Visit (INDEPENDENT_AMBULATORY_CARE_PROVIDER_SITE_OTHER): Payer: Medicare HMO | Admitting: Podiatry

## 2023-05-06 DIAGNOSIS — Z794 Long term (current) use of insulin: Secondary | ICD-10-CM | POA: Diagnosis not present

## 2023-05-06 DIAGNOSIS — M79675 Pain in left toe(s): Secondary | ICD-10-CM

## 2023-05-06 DIAGNOSIS — E1140 Type 2 diabetes mellitus with diabetic neuropathy, unspecified: Secondary | ICD-10-CM | POA: Diagnosis not present

## 2023-05-06 DIAGNOSIS — B351 Tinea unguium: Secondary | ICD-10-CM | POA: Diagnosis not present

## 2023-05-06 DIAGNOSIS — M79674 Pain in right toe(s): Secondary | ICD-10-CM | POA: Diagnosis not present

## 2023-05-06 NOTE — Progress Notes (Signed)
 This patient returns to my office for at risk foot care.  This patient requires this care by a professional since this patient will be at risk due to having diabetes. She presents to the office with her husband.  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 This patient is unable to cut nails herself since the patient cannot reach her nails.These nails are painful walking and wearing shoes.  This patient presents for at risk foot care today.  General Appearance  Alert, conversant and in no acute stress.  Vascular  Dorsalis pedis pulses are palpable  bilaterally.  Posterior tibial pulses ae absent due to swelling.  Capillary return is within normal limits  bilaterally. Temperature is within normal limits  bilaterally.Absent hair on digits.  Neurologic  Senn-Weinstein monofilament wire test diminished  bilaterally. Muscle power within normal limits bilaterally.  Nails Thick disfigured discolored nails with subungual debris  from hallux to fifth toes bilaterally. No evidence of bacterial infection or drainage bilaterally.  Orthopedic  No limitations of motion  feet .  No crepitus or effusions noted.  No bony pathology or digital deformities noted.  Skin  normotropic skin with no porokeratosis noted bilaterally.  No signs of infections or ulcers noted.     Onychomycosis  Pain in right toes  Pain in left toes  Consent was obtained for treatment procedures.   Mechanical debridement of nails 1-5  bilaterally performed with a nail nipper.  Filed with dremel without incident.   Return office visit   3 months                   Told patient to return for periodic foot care and evaluation due to potential at risk complications.   Helane Gunther DPM

## 2023-08-07 ENCOUNTER — Encounter: Payer: Self-pay | Admitting: Podiatry

## 2023-08-07 ENCOUNTER — Ambulatory Visit (INDEPENDENT_AMBULATORY_CARE_PROVIDER_SITE_OTHER): Payer: Medicare HMO | Admitting: Podiatry

## 2023-08-07 DIAGNOSIS — E1140 Type 2 diabetes mellitus with diabetic neuropathy, unspecified: Secondary | ICD-10-CM | POA: Diagnosis not present

## 2023-08-07 DIAGNOSIS — B351 Tinea unguium: Secondary | ICD-10-CM | POA: Diagnosis not present

## 2023-08-07 DIAGNOSIS — M79675 Pain in left toe(s): Secondary | ICD-10-CM

## 2023-08-07 DIAGNOSIS — Z794 Long term (current) use of insulin: Secondary | ICD-10-CM

## 2023-08-07 DIAGNOSIS — M79674 Pain in right toe(s): Secondary | ICD-10-CM

## 2023-08-07 NOTE — Progress Notes (Signed)
 This patient returns to my office for at risk foot care.  This patient requires this care by a professional since this patient will be at risk due to having diabetes. She presents to the office with her husband.  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 This patient is unable to cut nails herself since the patient cannot reach her nails.These nails are painful walking and wearing shoes.  This patient presents for at risk foot care today.  General Appearance  Alert, conversant and in no acute stress.  Vascular  Dorsalis pedis pulses are palpable  bilaterally.  Posterior tibial pulses ae absent due to swelling.  Capillary return is within normal limits  bilaterally. Temperature is within normal limits  bilaterally.Absent hair on digits.  Neurologic  Senn-Weinstein monofilament wire test diminished  bilaterally. Muscle power within normal limits bilaterally.  Nails Thick disfigured discolored nails with subungual debris  from hallux to fifth toes bilaterally. No evidence of bacterial infection or drainage bilaterally.  Orthopedic  No limitations of motion  feet .  No crepitus or effusions noted.  No bony pathology or digital deformities noted.  Skin  normotropic skin with no porokeratosis noted bilaterally.  No signs of infections or ulcers noted.     Onychomycosis  Pain in right toes  Pain in left toes  Consent was obtained for treatment procedures.   Mechanical debridement of nails 1-5  bilaterally performed with a nail nipper.  Filed with dremel without incident.  Patient says she injured her left leg this morning.  Neosporin/DSD applied to left leg.  Her redness in left leg was previously treated and patient says there was determined no infection.   Return office visit   3 months                   Told patient to return for periodic foot care and evaluation due to potential at risk complications.   Ruffin Cotton DPM

## 2023-08-09 ENCOUNTER — Encounter (HOSPITAL_COMMUNITY): Payer: Self-pay | Admitting: *Deleted

## 2023-08-28 ENCOUNTER — Emergency Department (HOSPITAL_COMMUNITY)

## 2023-08-28 ENCOUNTER — Encounter (HOSPITAL_COMMUNITY): Payer: Self-pay | Admitting: Emergency Medicine

## 2023-08-28 ENCOUNTER — Emergency Department (HOSPITAL_COMMUNITY)
Admission: EM | Admit: 2023-08-28 | Discharge: 2023-08-28 | Disposition: A | Discharge status: Home / Self Care | Attending: Emergency Medicine | Admitting: Emergency Medicine

## 2023-08-28 ENCOUNTER — Ambulatory Visit (HOSPITAL_COMMUNITY): Admission: EM | Admit: 2023-08-28 | Discharge: 2023-08-28 | Disposition: A | Discharge status: Home / Self Care

## 2023-08-28 ENCOUNTER — Encounter (HOSPITAL_COMMUNITY): Payer: Self-pay | Admitting: Radiology

## 2023-08-28 DIAGNOSIS — S0083XA Contusion of other part of head, initial encounter: Secondary | ICD-10-CM | POA: Insufficient documentation

## 2023-08-28 DIAGNOSIS — M25562 Pain in left knee: Secondary | ICD-10-CM | POA: Insufficient documentation

## 2023-08-28 DIAGNOSIS — Z7984 Long term (current) use of oral hypoglycemic drugs: Secondary | ICD-10-CM | POA: Insufficient documentation

## 2023-08-28 DIAGNOSIS — W19XXXA Unspecified fall, initial encounter: Secondary | ICD-10-CM

## 2023-08-28 DIAGNOSIS — W06XXXA Fall from bed, initial encounter: Secondary | ICD-10-CM | POA: Diagnosis not present

## 2023-08-28 DIAGNOSIS — E119 Type 2 diabetes mellitus without complications: Secondary | ICD-10-CM | POA: Insufficient documentation

## 2023-08-28 DIAGNOSIS — R519 Headache, unspecified: Secondary | ICD-10-CM

## 2023-08-28 DIAGNOSIS — S0990XA Unspecified injury of head, initial encounter: Secondary | ICD-10-CM | POA: Diagnosis present

## 2023-08-28 DIAGNOSIS — I10 Essential (primary) hypertension: Secondary | ICD-10-CM | POA: Insufficient documentation

## 2023-08-28 MED ORDER — OXYCODONE-ACETAMINOPHEN 5-325 MG PO TABS
1.0000 | ORAL_TABLET | Freq: Once | ORAL | Status: AC
  Administered 2023-08-28: 1 via ORAL
  Filled 2023-08-28: qty 1

## 2023-08-28 NOTE — ED Notes (Signed)
 Patient is being discharged from the Urgent Care and sent to the Emergency Department via POV with family. Per Chauncy Coral, PA, patient is in need of higher level of care due to head injury. Patient is aware and verbalizes understanding of plan of care.  Vitals:   08/28/23 1201  BP: (!) 151/77  Pulse: 82  Resp: 20  Temp: 97.7 F (36.5 C)  SpO2: 95%

## 2023-08-28 NOTE — ED Triage Notes (Signed)
 Pt reports rolled out of bed and fell onto the floor. Reports being on the floor and head hurting really bad. Denies taking any blood thinners. Pt has bandage on forehead and c/o head hurting as well as left leg.

## 2023-08-28 NOTE — Discharge Instructions (Signed)
 You had a fall and have been diagnosed with muscular injuries as result of this accident.    You will likely experience muscle spasms, muscle aches, and bruising as a result of these injuries.  Ultimately these injuries will take time to heal.  Rest, hydration, gentle exercise and stretching will aid in recovery from his injuries.    Using medication such as Tylenol  and your home oxycodone  will help alleviate pain as well as decrease swelling and inflammation associated with these injuries. You may use up to 800 mg ibuprofen  every 6 hours or up to 1000 mg of Tylenol  every 6 hours. Do not exceed 4000 mg of Tylenol  within 24 hours.   If your fall was today you will likely feel far more achy and painful tomorrow morning.  This is to be expected.  Additionally you may have sustained a concussion.  The management of this is supportive, I recommend that you get plenty of rest, stay well-hydrated.  Please limit your exposure to bright lights, loud noises, limit your screen time, and avoid contact sports until your symptoms have resolved.  Salt water/Epson salt soaks, massage, icy hot/Biofreeze/BenGay and other similar products can help with symptoms.  Follow-up with your PCP as needed.  Please return to the emergency department for reevaluation if you denies any new or concerning symptoms.

## 2023-08-28 NOTE — ED Triage Notes (Signed)
 Pt reports rolled out of bed and fell onto the floor. Reports hit left side of forehead. Denies taking any blood thinners. Denies dizziness, N/V or any other symptoms.

## 2023-08-28 NOTE — ED Provider Notes (Signed)
 [facility]    CSN: [id number] Arrival date & time: 08/28/23  1135      History   Chief Complaint Chief Complaint  Patient presents with   Fall    HPI Lacey Meyer is a 63 y.o. female.   Patient presents today with a severe headache after falling out of bed this morning.  She reports that she was sleeping and then woke up on the floor after hitting her head.  She is unsure if she had any loss of consciousness.  She has had nausea but no vomiting.  She does not take blood thinning medication.  She reports also hurting her left knee with pain rated 6 on a 0-10 pain scale, described as aching, no aggravating relieving factors identified.  She does take oxycodone  regularly for chronic pain and took a dose of that before coming to our clinic which has provided minimal relief of headache pain.  She reports that the headache pain is rated 8/9 on a 0-10 pain scale, localized to her left frontal region, described as throbbing, no alleviating factors identified.  She does have vision change she is legally blind in her left eye but reports new photophobia since the injury.  She does report that this is the worst headache that she is ever had.    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) 06/16/2019   Cellulitis and abscess of foot,  except toes 12/11/2018   Pain due to onychomycosis of toenails of both feet 08/27/2018   Deviated septum 09/23/2017   ETD (Eustachian tube dysfunction), bilateral 09/23/2017   Nasal turbinate hypertrophy 09/23/2017   OM (otitis media), recurrent, bilateral 09/23/2017   Rhinitis, chronic 09/23/2017   Community acquired pneumonia of left lower lobe of lung    Lactic acidosis    Left lower lobe pneumonia 06/16/2016   Sepsis (HCC) 06/16/2016   Acute encephalopathy 03/19/2016   Type 2 diabetes mellitus with obesity (HCC) 03/19/2016   Altered mental status 03/18/2016   Morbid obesity (HCC) 01/03/2016   Retinal hemorrhage 02/28/2013   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   Pain in limb 12/23/2012   Arthritis of foot 12/23/2012   Obesity, Class II, BMI 35-39.9, with comorbidity 06/25/2011   HTN (hypertension)    Impaired vision    Gout    Anemia    Pseudophakia 02/20/2011   Central loss of vision 02/20/2011   Acute loss of vision 12/15/2010    Past Surgical History:  Procedure Laterality Date   BTL     EYE SURGERY     cataract surgery / right eye    FOOT SURGERY     bilateral    LAPAROSCOPIC GASTRIC SLEEVE RESECTION N/A 01/03/2016   Procedure: LAPAROSCOPIC GASTRIC SLEEVE RESECTION WITH UPPER ENDO;  Surgeon: Juanita Norlander, MD;  Location: [facility];  Service: General;  Laterality: N/A;   SHOULDER ARTHROSCOPY W/ ROTATOR CUFF REPAIR     left    TUBAL LIGATION      OB History   No obstetric history on file.      Home Medications    Prior to Admission medications   Medication Sig Start Date End Date Taking? Authorizing Provider  ACCU-CHEK GUIDE test strip USE TO TEST THREE TIMES DAILY (E11.65) 03/17/18   [provider]  allopurinol  (ZYLOPRIM ) 300 MG tablet Take by mouth. 10/08/18   [provider]  ALPRAZolam  (XANAX ) 0.25 MG tablet Take 0.25 mg by mouth daily as needed for anxiety. 05/28/16   [provider]  B-D UF III MINI PEN NEEDLES 31G X 5 MM MISC  03/08/18    [provider]  benazepril  (LOTENSIN ) 10 MG tablet Take 10 mg by mouth daily. 05/09/16   [provider]  cephALEXin (KEFLEX) 500 MG capsule  12/08/18   [provider]  colchicine  0.6 MG tablet Take by mouth.    [provider]  diclofenac  (VOLTAREN ) 75 MG EC tablet Take by mouth.    [provider]  diclofenac  Sodium (VOLTAREN ) 1 % GEL  03/17/19   [provider]  diphenoxylate-atropine (LOMOTIL) 2.5-0.025 MG tablet TAKE 1 TABLET BY MOUTH EVERY 6 HOURS AS NEEDED FOR DIARRHEA 11/25/18   [provider]  DULoxetine  (CYMBALTA ) 60 MG capsule Take 60 mg by mouth daily. 03/23/16   [provider]  ferrous sulfate 325 (65 FE) MG tablet Take by mouth.    [provider]  fluticasone  (FLONASE ) 50 MCG/ACT nasal spray Place 1 spray into both nostrils daily. 08/19/21   White, Maybelle Spatz, NP  furosemide  (LASIX ) 40 MG tablet Take 1 tablet (40 mg total) by mouth daily. 06/18/16   Justina Oman, MD  glimepiride  (AMARYL ) 4 MG tablet Take 4 mg by mouth 2 (two) times daily.  10/16/15   [provider]  Lancets (ONETOUCH DELICA PLUS LANCET33G) MISC CHECK GLUCOSE 3 TIMES A DAY 01/17/18   [provider]  lidocaine  (XYLOCAINE ) 2 % solution Use as directed 15 mLs in the mouth or throat as needed for mouth pain. 08/19/21   Reena Canning, NP  LYRICA  150 MG capsule  04/28/19   [provider]  LYRICA  200 MG capsule Take 200 mg by mouth 4 (four) times daily.  08/04/15   [provider]  meloxicam  (MOBIC ) 15 MG tablet TAKE 1 TABLET BY MOUTH ONCE DAILY FOR 30 DAYS 12/20/17   [provider]  metFORMIN  (GLUCOPHAGE ) 500 MG tablet Take 500 mg by mouth 2 (two) times daily.  02/28/15   [provider]  methylPREDNISolone (MEDROL DOSEPAK) 4 MG TBPK tablet  02/27/19   [provider]  metoprolol  succinate (TOPROL -XL) 50 MG 24 hr tablet Take 50 mg by mouth daily.  12/28/15   [provider]   Multiple Vitamins-Minerals (BARIATRIC FUSION PO) Take 1 tablet by mouth daily.    [provider]  naloxone [facility]) 4 MG/0.1ML LIQD nasal spray kit PLEASE SEE ATTACHED FOR DETAILED DIRECTIONS 07/15/18   [provider]  nortriptyline  (PAMELOR ) 50 MG capsule TAKE 1 CAPSULE BY MOUTH DAILY AT BEDTIME 10/24/17   [provider]  NOVOLOG  FLEXPEN 100 UNIT/ML FlexPen Inject 2-15 Units into the skin 3 (three) times daily with meals.  04/23/16   [provider]  olmesartan (BENICAR) 20 MG tablet Take 20 mg by mouth daily. 08/04/21   [provider]  Olopatadine HCl 0.2 %  SOLN INSTILL 1 DROP IN EACH EYE DAILY AS NEEDED 01/17/18   [provider]  omeprazole  (PRILOSEC) 20 MG capsule Take 1 capsule (20 mg total) by mouth daily. 11/10/18 12/10/18  Christina Coyer, MD  ondansetron  (ZOFRAN -ODT) 4 MG disintegrating tablet DISSOLVE 1 TABLET IN MOUTH EVERY 8 HOURS AS NEEDED FOR UP TO 7 DAYS FOR NAUSEA OR VOMITING 11/10/18   [provider]  Oxycodone  HCl 10 MG TABS Take 10 mg by mouth every 8 (eight) hours as needed. 02/29/16   [provider]  pantoprazole  (PROTONIX ) 40 MG tablet  01/24/19   [provider]  simvastatin  (ZOCOR ) 40 MG tablet  03/01/19   [provider]  spironolactone  (ALDACTONE ) 25 MG tablet Take by mouth.    [provider]  tiZANidine (ZANAFLEX) 2 MG tablet TAKE 1 TABLET BY MOUTH AT BEDTIME AS NEEDED FOR PAINS 12/05/17   [provider]  triamcinolone  cream (KENALOG ) 0.5 % APPLY CREAM TOPICALLY TO LEGS TWICE A DAY AS NEEDED 01/17/18   [provider]  venlafaxine XR (EFFEXOR-XR) 150 MG 24 hr capsule Take 150 mg by mouth daily with breakfast.    [provider]  VENTOLIN  HFA 108 (90 Base) MCG/ACT inhaler  06/12/19   [provider]  Vitamin D , Ergocalciferol , (DRISDOL ) 1.25 MG (50000 UNIT) CAPS capsule  05/23/19   [provider]  XULTOPHY 100-3.6 UNIT-MG/ML SOPN  INJECT 20 UNITS SUBCUTANEOUSLY ONCE DAILY IN THE MORNING 11/07/17   [provider]  zolpidem (AMBIEN) 10 MG tablet Take 10 mg by mouth at bedtime as needed. 12/02/17   [provider]    Family History Family History  Problem Relation Age of Onset   Hypertension Mother    Hypertension Father    Breast cancer Sister        62s    Social History Social History   Tobacco Use   Smoking status: Former    Current packs/day: 0.00    Average packs/day: 0.3 packs/day for 1 year (0.3 ttl pk-yrs)    Types: Cigarettes    Start date: 03/06/2009    Quit date: 03/06/2010    Years since quitting: 13.4   Smokeless tobacco: Never  Vaping Use   Vaping status: Never Used  Substance Use Topics   Alcohol use: No   Drug use: No     Allergies   Ampicillin, Penicillins, Doxycycline, and Other   Review of Systems Review of Systems  Constitutional:  Positive for activity change. Negative for appetite change, fatigue and fever.  Eyes:  Positive for photophobia and visual disturbance (chronic).  Respiratory:  Negative for cough and shortness of breath.   Cardiovascular:  Negative for chest pain.  Gastrointestinal:  Positive for nausea. Negative for abdominal pain, diarrhea and vomiting.  Musculoskeletal:  Positive for arthralgias. Negative for back pain and myalgias.  Neurological:  Positive for headaches (worst of life). Negative for dizziness and light-headedness.     Physical Exam Triage Vital Signs ED Triage Vitals  Encounter Vitals Group     BP 08/28/23 1201 (!) 151/77     Girls Systolic BP Percentile --      Girls Diastolic BP Percentile --      Boys Systolic BP Percentile --      Boys Diastolic BP Percentile --      Pulse Rate 08/28/23 1201 82     Resp 08/28/23 1201 20     Temp 08/28/23 1201 97.7 F (36.5 C)     Temp Source 08/28/23  1201 Oral     SpO2 08/28/23 1201 95 %     Weight --      Height --      Head Circumference --      Peak Flow --      Pain  Score 08/28/23 1200 8     Pain Loc --      Pain Education --      Exclude from Growth Chart --    No data found.  Updated Vital Signs BP (!) 151/77 (BP Location: Left Arm)   Pulse 82   Temp 97.7 F (36.5 C) (Oral)   Resp 20   SpO2 95%   Visual Acuity Right Eye Distance:   Left Eye Distance:   Bilateral Distance:    Right Eye Near:   Left Eye Near:    Bilateral Near:     Physical Exam Vitals reviewed.  Constitutional:      General: She is awake. She is not in acute distress.    Appearance: Normal appearance. She is well-developed. She is not ill-appearing.     Comments: Very pleasant female appears stated age in no acute distress sitting comfortably in exam room  HENT:     Head: Normocephalic. Contusion present. No raccoon eyes or Battle's sign.      Right Ear: Tympanic membrane, ear canal and external ear normal. No hemotympanum.     Left Ear: Tympanic membrane, ear canal and external ear normal. No hemotympanum.     Mouth/Throat:     Tongue: Tongue does not deviate from midline.   Eyes:     Extraocular Movements: Extraocular movements intact.     Pupils: Pupils are equal, round, and reactive to light.    Cardiovascular:     Rate and Rhythm: Normal rate and regular rhythm.     Heart sounds: Normal heart sounds, S1 normal and S2 normal. No murmur heard. Pulmonary:     Effort: Pulmonary effort is normal.     Breath sounds: Normal breath sounds. No wheezing, rhonchi or rales.     Comments: Clear to auscultation bilaterally  Musculoskeletal:     Cervical back: Normal range of motion and neck supple. Tenderness present. No bony tenderness. No spinous process tenderness or muscular tenderness.     Thoracic back: Tenderness present. No bony tenderness.     Lumbar back: Tenderness present. No bony tenderness.     Left knee: Swelling present. Decreased range of motion. Tenderness present. No LCL laxity, MCL laxity, ACL laxity or PCL laxity.    Comments: Left knee:  Swelling noted anterior knee.  Tenderness palpation of anterior knee.  No ligamentous laxity on exam though exam was limited due to pain.   Neurological:     General: No focal deficit present.     Mental Status: She is alert and oriented to person, place, and time.     Cranial Nerves: Cranial nerves 2-12 are intact.     Motor: Motor function is intact.     Comments: Strength 3/5 left lower extremity secondary to pain compared to 4/5 on right.  Otherwise no focal neurological defect on exam.  Psychiatric:        Behavior: Behavior is cooperative.      UC Treatments / Results  Labs (all labs ordered are listed, but only abnormal results are displayed) Labs Reviewed - No data to display  EKG   Radiology No results found.  Procedures Procedures (including critical care time)  Medications Ordered in UC Medications -  No data to display  Initial Impression / Assessment and Plan / UC Course  I have reviewed the triage vital signs and the nursing notes.  Pertinent labs & imaging results that were available during my care of the patient were reviewed by me and considered in my medical decision making (see chart for details).     Patient is well-appearing, afebrile, nontoxic, nontachycardic.  Patient did have some weakness in her left lower extremity but I suspect this is related to pain.  Given she reports that this is the worst headache she is ever had I did recommend she go to the emergency room for further evaluation and management since we do not have CT capabilities in urgent care.  Patient was agreeable and will have her family member take her directly to [facility], ER.  She was stable at the time of discharge and safe for private transport.  Final Clinical Impressions(s) / UC Diagnoses   Final diagnoses:  Worst headache of life  Acute pain of left knee  Fall, initial encounter  Injury of head, initial encounter     Discharge Instructions      Please go to the ER  immediately for further evaluation and management.     ED Prescriptions   None    PDMP not reviewed this encounter.   Budd Cargo, PA-C 08/28/23 1225

## 2023-08-28 NOTE — Discharge Instructions (Signed)
 Please go to the ER immediately for further evaluation and management.

## 2023-08-28 NOTE — ED Provider Notes (Signed)
 [place] EMERGENCY DEPARTMENT AT [facility] Provider Note   CSN: [id number] Arrival date & time: 08/28/23  1236     Patient presents with: Fall and Head Injury   Lacey Meyer is a 63 y.o. female.   Patient with history of GERD, vision impairment, hypertension, diabetes presents today with complaints of fall. States that she rolled out of bed this morning and fell on the floor.  She did hit her head.  She is not anticoagulated.  States that she was able to get off the floor with help of her niece right after the fall.  Since then she has had a headache.  Has vision impairment at baseline, denies any new vision changes.  Also endorsing new pain in her left knee since the fall.  She has been able to walk with some discomfort.  She is on oxycodone  for chronic pain, took a dose this morning which helped.  She initially went to urgent care for evaluation who sent her here specifically for CT imaging given head injury.  She denies nausea, vomiting, chest pain, shortness of breath.  The history is provided by the patient. No language interpreter was used.  Fall  Head Injury      Prior to Admission medications   Medication Sig Start Date End Date Taking? Authorizing Provider  ACCU-CHEK GUIDE test strip USE TO TEST THREE TIMES DAILY (E11.65) 03/17/18   [provider]  allopurinol  (ZYLOPRIM ) 300 MG tablet Take by mouth. 10/08/18   [provider]  ALPRAZolam  (XANAX ) 0.25 MG tablet Take 0.25 mg by mouth daily as needed for anxiety. 05/28/16   [provider]  B-D UF III MINI PEN NEEDLES 31G X 5 MM MISC  03/08/18   [provider]  benazepril  (LOTENSIN ) 10 MG tablet Take 10 mg by mouth daily. 05/09/16   [provider]  cephALEXin (KEFLEX) 500 MG capsule  12/08/18   [provider]  colchicine  0.6 MG tablet Take by mouth.    [provider]  diclofenac  (VOLTAREN ) 75 MG EC tablet Take by mouth.    [provider]   diclofenac  Sodium (VOLTAREN ) 1 % GEL  03/17/19   [provider]  diphenoxylate-atropine (LOMOTIL) 2.5-0.025 MG tablet TAKE 1 TABLET BY MOUTH EVERY 6 HOURS AS NEEDED FOR DIARRHEA 11/25/18   [provider]  DULoxetine  (CYMBALTA ) 60 MG capsule Take 60 mg by mouth daily. 03/23/16   [provider]  ferrous sulfate 325 (65 FE) MG tablet Take by mouth.    [provider]  fluticasone  (FLONASE ) 50 MCG/ACT nasal spray Place 1 spray into both nostrils daily. 08/19/21   White, Maybelle Spatz, NP  furosemide  (LASIX ) 40 MG tablet Take 1 tablet (40 mg total) by mouth daily. 06/18/16   Justina Oman, MD  glimepiride  (AMARYL ) 4 MG tablet Take 4 mg by mouth 2 (two) times daily.  10/16/15   [provider]  Lancets (ONETOUCH DELICA PLUS LANCET33G) MISC CHECK GLUCOSE 3 TIMES A DAY 01/17/18   [provider]  lidocaine  (XYLOCAINE ) 2 % solution Use as directed 15 mLs in the mouth or throat as needed for mouth pain. 08/19/21   Reena Canning, NP  LYRICA  150 MG capsule  04/28/19   [provider]  LYRICA  200 MG capsule Take 200 mg by mouth 4 (four) times daily.  08/04/15   [provider]  meloxicam  (MOBIC ) 15 MG tablet TAKE 1 TABLET BY MOUTH ONCE DAILY FOR 30 DAYS 12/20/17  [provider]  metFORMIN  (GLUCOPHAGE ) 500 MG tablet Take 500 mg by mouth 2 (two) times daily.  02/28/15   [provider]  methylPREDNISolone (MEDROL DOSEPAK) 4 MG TBPK tablet  02/27/19   [provider]  metoprolol  succinate (TOPROL -XL) 50 MG 24 hr tablet Take 50 mg by mouth daily.  12/28/15   [provider]  Multiple Vitamins-Minerals (BARIATRIC FUSION PO) Take 1 tablet by mouth daily.    [provider]  naloxone [facility]) 4 MG/0.1ML LIQD nasal spray kit PLEASE SEE ATTACHED FOR DETAILED DIRECTIONS 07/15/18   [provider]  nortriptyline  (PAMELOR ) 50 MG capsule TAKE 1 CAPSULE BY MOUTH DAILY AT BEDTIME 10/24/17   [provider]  NOVOLOG  FLEXPEN 100 UNIT/ML FlexPen Inject 2-15 Units into the skin 3 (three) times daily with meals.  04/23/16   [provider]  olmesartan (BENICAR) 20 MG tablet Take 20 mg by mouth daily. 08/04/21   [provider]  Olopatadine HCl 0.2 % SOLN INSTILL 1 DROP IN EACH EYE DAILY AS NEEDED 01/17/18   [provider]  omeprazole  (PRILOSEC) 20 MG capsule Take 1 capsule (20 mg total) by mouth daily. 11/10/18 12/10/18  Christina Coyer, MD  ondansetron  (ZOFRAN -ODT) 4 MG disintegrating tablet DISSOLVE 1 TABLET IN MOUTH EVERY 8 HOURS AS NEEDED FOR UP TO 7 DAYS FOR NAUSEA OR VOMITING 11/10/18   [provider]  Oxycodone  HCl 10 MG TABS Take 10 mg by mouth every 8 (eight) hours as needed. 02/29/16   [provider]  pantoprazole  (PROTONIX ) 40 MG tablet  01/24/19   [provider]  simvastatin  (ZOCOR ) 40 MG tablet  03/01/19   [provider]  spironolactone  (ALDACTONE ) 25 MG tablet Take by mouth.    [provider]  tiZANidine (ZANAFLEX) 2 MG tablet TAKE 1 TABLET BY MOUTH AT BEDTIME AS NEEDED FOR PAINS 12/05/17   [provider]  triamcinolone  cream (KENALOG ) 0.5 % APPLY CREAM TOPICALLY TO LEGS TWICE A DAY AS NEEDED 01/17/18   [provider]  venlafaxine XR (EFFEXOR-XR) 150 MG 24 hr capsule Take 150 mg by mouth daily with breakfast.    [provider]  VENTOLIN  HFA 108 (90 Base) MCG/ACT inhaler  06/12/19   [provider]  Vitamin D , Ergocalciferol , (DRISDOL ) 1.25 MG (50000 UNIT) CAPS capsule  05/23/19   [provider]  XULTOPHY 100-3.6 UNIT-MG/ML SOPN INJECT 20 UNITS SUBCUTANEOUSLY ONCE DAILY IN THE MORNING 11/07/17   [provider]  zolpidem (AMBIEN) 10 MG tablet Take 10 mg by mouth at bedtime as needed. 12/02/17   [provider]    Allergies: Ampicillin, Penicillins, Doxycycline, and Other    Review of Systems  Musculoskeletal:  Positive for arthralgias.   All other systems reviewed and are negative.   Updated Vital Signs BP (!) 156/70 (BP Location: Left Arm)   Pulse 78   Temp 98.3 F (36.8 C) (Oral)   Resp 15   SpO2 100%   Physical Exam Vitals and nursing note reviewed.  Constitutional:      General: She is not in acute distress.    Appearance: Normal appearance. She is normal weight. She is not ill-appearing, toxic-appearing or diaphoretic.  HENT:     Head: Normocephalic and atraumatic.     Comments: No racoon eyes No battle sign  Hematoma noted to the left forehead without crepitus bruising or deformity. No overlying wound.   Eyes:     Extraocular Movements: Extraocular movements intact.     Conjunctiva/sclera:  Conjunctivae normal.     Pupils: Pupils are equal, round, and reactive to light.    Cardiovascular:     Rate and Rhythm: Normal rate.     Comments: No tenderness to palpation of the anterior chest wall Pulmonary:     Effort: Pulmonary effort is normal. No respiratory distress.  Abdominal:     Comments: No abdominal tenderness or bruising   Musculoskeletal:        General: Normal range of motion.     Cervical back: Normal and normal range of motion.     Thoracic back: Normal.     Lumbar back: Normal.     Comments: No midline tenderness, no stepoffs or deformity noted on palpation of cervical, thoracic, and lumbar spine  TTP noted to the left anterior knee without bruising, or deformity good distal pulses and sensation. ROM intact with some discomfort.   Skin:    General: Skin is warm and dry.   Neurological:     General: No focal deficit present.     Mental Status: She is alert and oriented to person, place, and time.   Psychiatric:        Mood and Affect: Mood normal.        Behavior: Behavior normal.     (all labs ordered are listed, but only abnormal results are displayed) Labs Reviewed - No data to display  EKG: None  Radiology: CT Head Wo Contrast Result Date: 08/28/2023 CLINICAL DATA:   Head trauma, moderate-severe.  Fall, hit head. EXAM: CT HEAD WITHOUT CONTRAST TECHNIQUE: Contiguous axial images were obtained from the base of the skull through the vertex without intravenous contrast. RADIATION DOSE REDUCTION: This exam was performed according to the departmental dose-optimization program which includes automated exposure control, adjustment of the mA and/or kV according to patient size and/or use of iterative reconstruction technique. COMPARISON:  None Available. FINDINGS: Brain: No acute intracranial abnormality. Specifically, no hemorrhage, hydrocephalus, mass lesion, acute infarction, or significant intracranial injury. Vascular: No hyperdense vessel or unexpected calcification. Skull: No acute calvarial abnormality. Sinuses/Orbits: No acute findings Other: Soft tissue swelling in the left forehead IMPRESSION: No acute intracranial abnormality. Electronically Signed   By: Janeece Mechanic M.D.   On: 08/28/2023 15:35   DG Knee Complete 4 Views Left Result Date: 08/28/2023 CLINICAL DATA:  Fall, left knee pain EXAM: LEFT KNEE - COMPLETE 4+ VIEW COMPARISON:  None Available. FINDINGS: Moderate tricompartment degenerative changes, most pronounced in the medial compartment. No joint effusion. No acute bony abnormality. Specifically, no fracture, subluxation, or dislocation. IMPRESSION: Moderate degenerative changes.  No acute bony abnormality. Electronically Signed   By: Janeece Mechanic M.D.   On: 08/28/2023 15:34     Procedures   Medications Ordered in the ED  oxyCODONE -acetaminophen  (PERCOCET/ROXICET) 5-325 MG per tablet 1-2 tablet (has no administration in time range)                                    Medical Decision Making Amount and/or Complexity of Data Reviewed Radiology: ordered.  Risk Prescription drug management.   Patient presents today with complaints of mechanical fall earlier this morning.  They are afebrile, nontoxic-appearing, and in no acute distress with  reassuring vital signs.  Physical exam reveals hematoma to left anterior forehead without crepitus, deformity, or overlying wounds. TTP noted to left knee without overlying skin changes, bruising, or deformity. Ambulatory. Patient without signs of serious head, neck,  or back injury. No midline spinal tenderness or TTP of the chest or abd.  Normal neurological exam. No concern for closed head injury, lung injury, or intraabdominal injury. Ct imaging of the head and X-ray imaging obtained of the left knee which has resulted and reveals  No acute findings  I have personally reviewed and interpreted this imaging and agree with radiology interpretation.  Radiology without acute abnormality.  Patient is able to ambulate without difficulty in the ED.  Pt is hemodynamically stable, in NAD.   Pain has been managed & pt has no complaints prior to dc.  Patient counseled on typical course of muscle stiffness and soreness post-fall. Concussion precautions discussed as well.  Discussed s/s that should cause them to return. Patient instructed on NSAID use.  She is also prescribed oxycodone  already, recommend she continue to take this as prescribed. Encouraged PCP follow-up for recheck if symptoms are not improved in one week. Evaluation and diagnostic testing in the emergency department does not suggest an emergent condition requiring admission or immediate intervention beyond what has been performed at this time.  Plan for discharge with close PCP follow-up.  Patient is understanding and amenable with plan, educated on red flag symptoms that would prompt immediate return.  Patient discharged in stable condition.  Final diagnoses:  Fall, initial encounter    ED Discharge Orders     None     An After Visit Summary was printed and given to the patient.      Cana Mignano A, PA-C 08/28/23 1651    Kingsley, Victoria K, DO 08/28/23 1709

## 2023-11-07 ENCOUNTER — Encounter: Payer: Self-pay | Admitting: Podiatry

## 2023-11-07 ENCOUNTER — Ambulatory Visit (INDEPENDENT_AMBULATORY_CARE_PROVIDER_SITE_OTHER): Admitting: Podiatry

## 2023-11-07 DIAGNOSIS — M79675 Pain in left toe(s): Secondary | ICD-10-CM

## 2023-11-07 DIAGNOSIS — M79674 Pain in right toe(s): Secondary | ICD-10-CM | POA: Diagnosis not present

## 2023-11-07 DIAGNOSIS — B351 Tinea unguium: Secondary | ICD-10-CM | POA: Diagnosis not present

## 2023-11-07 DIAGNOSIS — E1140 Type 2 diabetes mellitus with diabetic neuropathy, unspecified: Secondary | ICD-10-CM

## 2023-11-07 DIAGNOSIS — Z794 Long term (current) use of insulin: Secondary | ICD-10-CM

## 2023-11-07 NOTE — Progress Notes (Signed)
 This patient returns to my office for at risk foot care.  This patient requires this care by a professional since this patient will be at risk due to having diabetes. She presents to the office with her husband.  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 This patient is unable to cut nails herself since the patient cannot reach her nails.These nails are painful walking and wearing shoes.  This patient presents for at risk foot care today.  General Appearance  Alert, conversant and in no acute stress.  Vascular  Dorsalis pedis pulses are palpable  bilaterally.  Posterior tibial pulses ae absent due to swelling.  Capillary return is within normal limits  bilaterally. Temperature is within normal limits  bilaterally.Absent hair on digits.  Neurologic  Senn-Weinstein monofilament wire test diminished  bilaterally. Muscle power within normal limits bilaterally.  Nails Thick disfigured discolored nails with subungual debris  from hallux to fifth toes bilaterally. No evidence of bacterial infection or drainage bilaterally.  Orthopedic  No limitations of motion  feet .  No crepitus or effusions noted.  No bony pathology or digital deformities noted.  Skin  normotropic skin with no porokeratosis noted bilaterally.  No signs of infections or ulcers noted.     Onychomycosis  Pain in right toes  Pain in left toes  Consent was obtained for treatment procedures.   Mechanical debridement of nails 1-5  bilaterally performed with a nail nipper.  Filed with dremel without incident.   Return office visit   3 months                   Told patient to return for periodic foot care and evaluation due to potential at risk complications.   Helane Gunther DPM

## 2023-11-16 ENCOUNTER — Emergency Department (HOSPITAL_COMMUNITY)

## 2023-11-16 ENCOUNTER — Encounter (HOSPITAL_COMMUNITY): Payer: Self-pay

## 2023-11-16 ENCOUNTER — Inpatient Hospital Stay (HOSPITAL_COMMUNITY)

## 2023-11-16 ENCOUNTER — Inpatient Hospital Stay (HOSPITAL_COMMUNITY)
Admission: EM | Admit: 2023-11-16 | Discharge: 2023-11-20 | DRG: 872 | Disposition: A | Discharge status: Home / Self Care | Attending: Internal Medicine | Admitting: Internal Medicine

## 2023-11-16 DIAGNOSIS — Z794 Long term (current) use of insulin: Secondary | ICD-10-CM

## 2023-11-16 DIAGNOSIS — L03116 Cellulitis of left lower limb: Secondary | ICD-10-CM | POA: Diagnosis present

## 2023-11-16 DIAGNOSIS — R296 Repeated falls: Secondary | ICD-10-CM | POA: Diagnosis present

## 2023-11-16 DIAGNOSIS — E1140 Type 2 diabetes mellitus with diabetic neuropathy, unspecified: Secondary | ICD-10-CM | POA: Diagnosis present

## 2023-11-16 DIAGNOSIS — M1990 Unspecified osteoarthritis, unspecified site: Secondary | ICD-10-CM | POA: Diagnosis present

## 2023-11-16 DIAGNOSIS — Z6831 Body mass index (BMI) 31.0-31.9, adult: Secondary | ICD-10-CM | POA: Diagnosis not present

## 2023-11-16 DIAGNOSIS — E8721 Acute metabolic acidosis: Secondary | ICD-10-CM | POA: Diagnosis not present

## 2023-11-16 DIAGNOSIS — E1165 Type 2 diabetes mellitus with hyperglycemia: Secondary | ICD-10-CM | POA: Diagnosis present

## 2023-11-16 DIAGNOSIS — N179 Acute kidney failure, unspecified: Principal | ICD-10-CM | POA: Diagnosis present

## 2023-11-16 DIAGNOSIS — I129 Hypertensive chronic kidney disease with stage 1 through stage 4 chronic kidney disease, or unspecified chronic kidney disease: Secondary | ICD-10-CM | POA: Diagnosis present

## 2023-11-16 DIAGNOSIS — L0390 Cellulitis, unspecified: Secondary | ICD-10-CM | POA: Diagnosis not present

## 2023-11-16 DIAGNOSIS — I872 Venous insufficiency (chronic) (peripheral): Secondary | ICD-10-CM | POA: Diagnosis present

## 2023-11-16 DIAGNOSIS — G8929 Other chronic pain: Secondary | ICD-10-CM | POA: Diagnosis present

## 2023-11-16 DIAGNOSIS — A419 Sepsis, unspecified organism: Principal | ICD-10-CM | POA: Diagnosis present

## 2023-11-16 DIAGNOSIS — D6959 Other secondary thrombocytopenia: Secondary | ICD-10-CM | POA: Diagnosis present

## 2023-11-16 DIAGNOSIS — E1169 Type 2 diabetes mellitus with other specified complication: Secondary | ICD-10-CM | POA: Diagnosis not present

## 2023-11-16 DIAGNOSIS — Z880 Allergy status to penicillin: Secondary | ICD-10-CM

## 2023-11-16 DIAGNOSIS — Z888 Allergy status to other drugs, medicaments and biological substances status: Secondary | ICD-10-CM

## 2023-11-16 DIAGNOSIS — E1122 Type 2 diabetes mellitus with diabetic chronic kidney disease: Secondary | ICD-10-CM | POA: Diagnosis present

## 2023-11-16 DIAGNOSIS — M109 Gout, unspecified: Secondary | ICD-10-CM | POA: Diagnosis present

## 2023-11-16 DIAGNOSIS — D631 Anemia in chronic kidney disease: Secondary | ICD-10-CM | POA: Diagnosis present

## 2023-11-16 DIAGNOSIS — K219 Gastro-esophageal reflux disease without esophagitis: Secondary | ICD-10-CM | POA: Diagnosis present

## 2023-11-16 DIAGNOSIS — Z87891 Personal history of nicotine dependence: Secondary | ICD-10-CM

## 2023-11-16 DIAGNOSIS — Z79899 Other long term (current) drug therapy: Secondary | ICD-10-CM

## 2023-11-16 DIAGNOSIS — E875 Hyperkalemia: Secondary | ICD-10-CM | POA: Diagnosis not present

## 2023-11-16 DIAGNOSIS — M7989 Other specified soft tissue disorders: Secondary | ICD-10-CM

## 2023-11-16 DIAGNOSIS — Z7984 Long term (current) use of oral hypoglycemic drugs: Secondary | ICD-10-CM

## 2023-11-16 DIAGNOSIS — N1831 Chronic kidney disease, stage 3a: Secondary | ICD-10-CM | POA: Diagnosis present

## 2023-11-16 DIAGNOSIS — E669 Obesity, unspecified: Secondary | ICD-10-CM

## 2023-11-16 DIAGNOSIS — E66811 Obesity, class 1: Secondary | ICD-10-CM | POA: Diagnosis present

## 2023-11-16 DIAGNOSIS — H547 Unspecified visual loss: Secondary | ICD-10-CM | POA: Diagnosis present

## 2023-11-16 DIAGNOSIS — F1120 Opioid dependence, uncomplicated: Secondary | ICD-10-CM | POA: Diagnosis not present

## 2023-11-16 DIAGNOSIS — Z881 Allergy status to other antibiotic agents status: Secondary | ICD-10-CM

## 2023-11-16 DIAGNOSIS — Z8249 Family history of ischemic heart disease and other diseases of the circulatory system: Secondary | ICD-10-CM

## 2023-11-16 DIAGNOSIS — Z803 Family history of malignant neoplasm of breast: Secondary | ICD-10-CM

## 2023-11-16 DIAGNOSIS — Z9884 Bariatric surgery status: Secondary | ICD-10-CM

## 2023-11-16 DIAGNOSIS — Z79891 Long term (current) use of opiate analgesic: Secondary | ICD-10-CM

## 2023-11-16 DIAGNOSIS — Z6841 Body Mass Index (BMI) 40.0 and over, adult: Secondary | ICD-10-CM | POA: Insufficient documentation

## 2023-11-16 LAB — ETHANOL: Alcohol, Ethyl (B): <15 mg/dL (ref <15)

## 2023-11-16 LAB — CBC
HCT: 31.8 % — ABNORMAL LOW (ref 36.0–46.0)
Hemoglobin: 10.3 g/dL — ABNORMAL LOW (ref 12.0–15.0)
MCH: 28.1 pg (ref 26.0–34.0)
MCHC: 32.4 g/dL (ref 30.0–36.0)
MCV: 86.9 fL (ref 80.0–100.0)
Platelets: 111 K/uL — ABNORMAL LOW (ref 150–400)
RBC: 3.66 MIL/uL — ABNORMAL LOW (ref 3.87–5.11)
RDW: 15.5 % (ref 11.5–15.5)
WBC: 14.9 K/uL — ABNORMAL HIGH (ref 4.0–10.5)
nRBC: 0 % (ref 0.0–0.2)

## 2023-11-16 LAB — I-STAT CHEM 8, ED
BUN: 41 mg/dL — ABNORMAL HIGH (ref 8–23)
Calcium, Ion: 1.16 mmol/L (ref 1.15–1.40)
Chloride: 110 mmol/L (ref 98–111)
Creatinine, Ser: 2.7 mg/dL — ABNORMAL HIGH (ref 0.44–1.00)
Glucose, Bld: 231 mg/dL — ABNORMAL HIGH (ref 70–99)
HCT: 30 % — ABNORMAL LOW (ref 36.0–46.0)
Hemoglobin: 10.2 g/dL — ABNORMAL LOW (ref 12.0–15.0)
Potassium: 5.2 mmol/L — ABNORMAL HIGH (ref 3.5–5.1)
Sodium: 138 mmol/L (ref 135–145)
TCO2: 19 mmol/L — ABNORMAL LOW (ref 22–32)

## 2023-11-16 LAB — RESP PANEL BY RT-PCR (RSV, FLU A&B, COVID)  RVPGX2
Influenza A by PCR: NEGATIVE
Influenza B by PCR: NEGATIVE
Resp Syncytial Virus by PCR: NEGATIVE
SARS Coronavirus 2 by RT PCR: NEGATIVE

## 2023-11-16 LAB — GLUCOSE, CAPILLARY
Glucose-Capillary: 55 mg/dL — ABNORMAL LOW (ref 70–99)
Glucose-Capillary: 64 mg/dL — ABNORMAL LOW (ref 70–99)
Glucose-Capillary: 72 mg/dL (ref 70–99)

## 2023-11-16 LAB — I-STAT CG4 LACTIC ACID, ED: Lactic Acid, Venous: 1.7 mmol/L (ref 0.5–1.9)

## 2023-11-16 LAB — COMPREHENSIVE METABOLIC PANEL WITH GFR
ALT: 23 U/L (ref 0–44)
AST: 24 U/L (ref 15–41)
Albumin: 3.3 g/dL — ABNORMAL LOW (ref 3.5–5.0)
Alkaline Phosphatase: 57 U/L (ref 38–126)
Anion gap: 13 (ref 5–15)
BUN: 47 mg/dL — ABNORMAL HIGH (ref 8–23)
CO2: 18 mmol/L — ABNORMAL LOW (ref 22–32)
Calcium: 9 mg/dL (ref 8.9–10.3)
Chloride: 106 mmol/L (ref 98–111)
Creatinine, Ser: 2.49 mg/dL — ABNORMAL HIGH (ref 0.44–1.00)
GFR, Estimated: 21 mL/min — ABNORMAL LOW (ref >60)
Glucose, Bld: 222 mg/dL — ABNORMAL HIGH (ref 70–99)
Potassium: 5.2 mmol/L — ABNORMAL HIGH (ref 3.5–5.1)
Sodium: 137 mmol/L (ref 135–145)
Total Bilirubin: 0.6 mg/dL (ref 0.0–1.2)
Total Protein: 8.4 g/dL — ABNORMAL HIGH (ref 6.5–8.1)

## 2023-11-16 LAB — PROTIME-INR
INR: 1.3 — ABNORMAL HIGH (ref 0.8–1.2)
Prothrombin Time: 17.1 s — ABNORMAL HIGH (ref 11.4–15.2)

## 2023-11-16 LAB — HEMOGLOBIN A1C
Hgb A1c MFr Bld: 7.2 % — ABNORMAL HIGH (ref 4.8–5.6)
Mean Plasma Glucose: 159.94 mg/dL

## 2023-11-16 LAB — CK: Total CK: 254 U/L — ABNORMAL HIGH (ref 38–234)

## 2023-11-16 MED ORDER — PANTOPRAZOLE SODIUM 20 MG PO TBEC
20.0000 mg | DELAYED_RELEASE_TABLET | Freq: Every day | ORAL | Status: DC
Start: 2023-11-16 — End: 2023-11-20
  Administered 2023-11-16 – 2023-11-20 (×5): 20 mg via ORAL
  Filled 2023-11-16 (×5): qty 1

## 2023-11-16 MED ORDER — BISACODYL 5 MG PO TBEC
5.0000 mg | DELAYED_RELEASE_TABLET | Freq: Every day | ORAL | Status: DC | PRN
  Administered 2023-11-17: 5 mg via ORAL
  Filled 2023-11-16: qty 1

## 2023-11-16 MED ORDER — INSULIN ASPART PROT & ASPART (70-30 MIX) 100 UNIT/ML ~~LOC~~ SUSP
30.0000 [IU] | Freq: Two times a day (BID) | SUBCUTANEOUS | Status: DC
  Filled 2023-11-16: qty 10

## 2023-11-16 MED ORDER — ENOXAPARIN SODIUM 40 MG/0.4ML IJ SOSY: 40.0000 mg | PREFILLED_SYRINGE | INTRAMUSCULAR | Status: DC

## 2023-11-16 MED ORDER — FENTANYL CITRATE PF 50 MCG/ML IJ SOSY
50.0000 ug | PREFILLED_SYRINGE | Freq: Once | INTRAMUSCULAR | Status: AC
  Administered 2023-11-16: 50 ug via INTRAVENOUS
  Filled 2023-11-16: qty 1

## 2023-11-16 MED ORDER — SODIUM CHLORIDE 0.9 % IV SOLN
1.0000 g | Freq: Every day | INTRAVENOUS | Status: DC
  Administered 2023-11-17: 1 g via INTRAVENOUS
  Filled 2023-11-16: qty 1

## 2023-11-16 MED ORDER — METRONIDAZOLE 500 MG/100ML IV SOLN
500.0000 mg | Freq: Once | INTRAVENOUS | Status: AC
  Administered 2023-11-16: 500 mg via INTRAVENOUS
  Filled 2023-11-16: qty 100

## 2023-11-16 MED ORDER — VANCOMYCIN HCL 2000 MG/400ML IV SOLN
2000.0000 mg | Freq: Once | INTRAVENOUS | Status: AC
  Administered 2023-11-16: 2000 mg via INTRAVENOUS
  Filled 2023-11-16: qty 400

## 2023-11-16 MED ORDER — ACETAMINOPHEN 325 MG PO TABS
650.0000 mg | ORAL_TABLET | Freq: Four times a day (QID) | ORAL | Status: DC | PRN
  Administered 2023-11-17: 650 mg via ORAL
  Filled 2023-11-16: qty 2

## 2023-11-16 MED ORDER — OXYCODONE HCL 5 MG PO TABS: 5.0000 mg | ORAL_TABLET | Freq: Four times a day (QID) | ORAL | Status: DC | PRN

## 2023-11-16 MED ORDER — RENA-VITE PO TABS
1.0000 | ORAL_TABLET | Freq: Every day | ORAL | Status: DC
  Administered 2023-11-16 – 2023-11-20 (×5): 1 via ORAL
  Filled 2023-11-16 (×5): qty 1

## 2023-11-16 MED ORDER — VANCOMYCIN HCL IN DEXTROSE 1-5 GM/200ML-% IV SOLN: 1000.0000 mg | Freq: Once | INTRAVENOUS | Status: DC

## 2023-11-16 MED ORDER — HEPARIN SODIUM (PORCINE) 5000 UNIT/ML IJ SOLN
5000.0000 [IU] | Freq: Three times a day (TID) | INTRAMUSCULAR | Status: DC
  Administered 2023-11-16 – 2023-11-17 (×2): 5000 [IU] via SUBCUTANEOUS
  Filled 2023-11-16 (×2): qty 1

## 2023-11-16 MED ORDER — VANCOMYCIN VARIABLE DOSE PER UNSTABLE RENAL FUNCTION (PHARMACIST DOSING): Status: DC

## 2023-11-16 MED ORDER — HYDROMORPHONE HCL 1 MG/ML IJ SOLN
0.5000 mg | Freq: Once | INTRAMUSCULAR | Status: DC
  Filled 2023-11-16: qty 1

## 2023-11-16 MED ORDER — PREGABALIN 75 MG PO CAPS
150.0000 mg | ORAL_CAPSULE | Freq: Three times a day (TID) | ORAL | Status: DC
  Administered 2023-11-16 – 2023-11-17 (×2): 150 mg via ORAL
  Filled 2023-11-16 (×2): qty 2

## 2023-11-16 MED ORDER — SODIUM CHLORIDE 0.9 % IV SOLN
1.0000 g | Freq: Every day | INTRAVENOUS | Status: DC
  Filled 2023-11-16: qty 10

## 2023-11-16 MED ORDER — INSULIN ASPART 100 UNIT/ML IJ SOLN: 0.0000 [IU] | Freq: Three times a day (TID) | INTRAMUSCULAR | Status: DC

## 2023-11-16 MED ORDER — SODIUM CHLORIDE 0.9 % IV BOLUS
1000.0000 mL | Freq: Once | INTRAVENOUS | Status: AC
  Administered 2023-11-16: 1000 mL via INTRAVENOUS

## 2023-11-16 MED ORDER — ACETAMINOPHEN 650 MG RE SUPP: 650.0000 mg | Freq: Four times a day (QID) | RECTAL | Status: DC | PRN

## 2023-11-16 MED ORDER — ALPRAZOLAM 0.25 MG PO TABS
0.2500 mg | ORAL_TABLET | Freq: Every evening | ORAL | Status: DC | PRN
  Administered 2023-11-18: 0.25 mg via ORAL
  Filled 2023-11-16 (×2): qty 1

## 2023-11-16 MED ORDER — ALLOPURINOL 300 MG PO TABS
150.0000 mg | ORAL_TABLET | Freq: Every day | ORAL | Status: DC
  Administered 2023-11-17 – 2023-11-20 (×4): 150 mg via ORAL
  Filled 2023-11-16 (×4): qty 1

## 2023-11-16 MED ORDER — LACTATED RINGERS IV BOLUS
1000.0000 mL | Freq: Once | INTRAVENOUS | Status: AC
  Administered 2023-11-16: 1000 mL via INTRAVENOUS

## 2023-11-16 MED ORDER — SODIUM CHLORIDE 0.9 % IV SOLN
2.0000 g | Freq: Once | INTRAVENOUS | Status: AC
  Administered 2023-11-16: 2 g via INTRAVENOUS
  Filled 2023-11-16: qty 12.5

## 2023-11-16 NOTE — ED Triage Notes (Signed)
 Pt BIB [facility] from home d/t fall that happened around 12. It was a mechanical fall . Pt got tripped over then fell. Pt did hit the back of her head. Hematoma noted. Positive LOC.  C/O L pain. Pt is altered. Pt's baseline is A&O X4. VSS.

## 2023-11-16 NOTE — ED Provider Notes (Signed)
  EMERGENCY DEPARTMENT AT [facility] Provider Note   CSN: [id number] Arrival date & time: 11/16/23  1420     Patient presents with: Lacey Meyer   Lacey Meyer is a 63 y.o. female.   HPI 63 year old female presents after a fall.  History is from patient and later husband.  Patient has a history of diabetes, hypertension, chronic vision abnormalities, arthritis and gout.  She slipped while trying to get ready for a shower today.  She states that she hit her head but did not lose consciousness.  She states she was on the ground for several hours as no one else was in the house at the time and her left leg got injured.  She is having some pain to the back of her head but primarily left leg/knee pain.  No fevers, cough, shortness of breath, urinary symptoms or diarrhea.  She is found to have a left greater than right lower extremity in size on exam.  Seems like this might be somewhat chronic but the skin looks different to the husband, in particular on the left side with redness.  Patient has not noticed any preceding symptoms prior to the fall.  Prior to Admission medications   Medication Sig Start Date End Date Taking? Authorizing Provider  allopurinol  (ZYLOPRIM ) 300 MG tablet Take by mouth. 10/08/18  Yes [provider]  ALPRAZolam  (XANAX ) 0.25 MG tablet Take 0.25 mg by mouth daily as needed for anxiety. 05/28/16  Yes [provider]  benazepril  (LOTENSIN ) 10 MG tablet Take 10 mg by mouth daily. 05/09/16  Yes [provider]  colchicine  0.6 MG tablet Take by mouth.   Yes [provider]  furosemide  (LASIX ) 40 MG tablet Take 1 tablet (40 mg total) by mouth daily. 06/18/16  Yes Ricky Fines, MD  glimepiride  (AMARYL ) 4 MG tablet Take 4 mg by mouth 2 (two) times daily.  10/16/15  Yes [provider]  insulin  NPH-regular Human (70-30) 100 UNIT/ML injection Inject 30-40 Units into the skin. Takes  40 units in the morning and 30 units at night    Yes [provider]  LYRICA  200 MG capsule Take 200 mg by mouth 4 (four) times daily.  08/04/15  Yes [provider]  metoprolol  succinate (TOPROL -XL) 50 MG 24 hr tablet Take 50 mg by mouth daily.  12/28/15  Yes [provider]  Multiple Vitamins-Minerals (BARIATRIC FUSION PO) Take 1 tablet by mouth daily.   Yes [provider]  naloxone (NARCAN) 4 MG/0.1ML LIQD nasal spray kit PLEASE SEE ATTACHED FOR DETAILED DIRECTIONS 07/15/18  Yes [provider]  NOVOLOG  FLEXPEN 100 UNIT/ML FlexPen Inject 2-15 Units into the skin 3 (three) times daily with meals.  04/23/16  Yes [provider]  olmesartan (BENICAR) 20 MG tablet Take 20 mg by mouth daily. 08/04/21  Yes [provider]  Oxycodone  HCl 10 MG TABS Take 10 mg by mouth every 8 (eight) hours as needed. 02/29/16  Yes [provider]  pantoprazole  (PROTONIX ) 40 MG tablet  01/24/19  Yes [provider]  simvastatin  (ZOCOR ) 40 MG tablet  03/01/19  Yes [provider]  Vitamin D , Ergocalciferol , (DRISDOL ) 1.25 MG (50000 UNIT) CAPS capsule  05/23/19  Yes [provider]  zolpidem (AMBIEN) 10 MG tablet Take 10 mg by mouth at bedtime as needed. 12/02/17  Yes [provider]  ACCU-CHEK GUIDE test strip USE TO TEST THREE TIMES DAILY (E11.65) 03/17/18   [provider]  B-D UF III  MINI PEN NEEDLES 31G X 5 MM MISC  03/08/18   [provider]  fluticasone  (FLONASE ) 50 MCG/ACT nasal spray Place 1 spray into both nostrils daily. Patient not taking: Reported on 11/16/2023 08/19/21   Teresa Shelba SAUNDERS, NP  Lancets [facility] DELICA PLUS [place]) MISC CHECK GLUCOSE 3 TIMES A DAY 01/17/18   [provider]  lidocaine  (XYLOCAINE ) 2 % solution Use as directed 15 mLs in the mouth or throat as needed for mouth pain. Patient not taking: Reported on 11/16/2023 08/19/21   Teresa Shelba SAUNDERS, NP  omeprazole  (PRILOSEC) 20 MG capsule Take 1 capsule (20 mg total)  by mouth daily. Patient not taking: Reported on 11/16/2023 11/10/18 12/10/18  Konidena, Snehalatha, MD    Allergies: Ampicillin, Penicillins, Doxycycline, and Other    Review of Systems  Constitutional:  Negative for fever.  Respiratory:  Negative for shortness of breath.   Cardiovascular:  Negative for chest pain.  Gastrointestinal:  Negative for abdominal pain.  Musculoskeletal:  Positive for arthralgias and myalgias.  Neurological:  Positive for headaches. Negative for weakness and numbness.    Updated Vital Signs BP 138/64 (BP Location: Left Arm)   Pulse 95   Temp 98.9 F (37.2 C) (Oral)   Resp 16   Ht 5' 3 (1.6 m)   Wt 80.2 kg   SpO2 100%   BMI 31.32 kg/m   Physical Exam Vitals and nursing note reviewed.  Constitutional:      Appearance: She is well-developed. She is obese.     Interventions: Cervical collar in place.  HENT:     Head: Normocephalic.   Cardiovascular:     Rate and Rhythm: Regular rhythm. Tachycardia present.     Pulses:          Dorsalis pedis pulses are 2+ on the right side and detected w/ Doppler on the left side.     Heart sounds: Normal heart sounds.  Pulmonary:     Effort: Pulmonary effort is normal.     Breath sounds: Normal breath sounds.  Abdominal:     General: There is no distension.     Palpations: Abdomen is soft.     Tenderness: There is no abdominal tenderness.  Musculoskeletal:     Right hip: No tenderness.     Left hip: No tenderness.     Left upper leg: Tenderness present.     Right knee: Tenderness present.     Left knee: Tenderness present.     Left lower leg: Swelling and tenderness present.     Left ankle: No tenderness.     Left foot: No tenderness.     Comments: There is tenderness to the distal upper leg, left knee, and diffusely in her left calf on the left side.  No obvious deformities.  The left lower leg is swollen compared to the right.  It is also warmer and has some dark erythema/discoloration.  Intact sensation  and movement of both feet.  Skin:    General: Skin is warm and dry.  Neurological:     Mental Status: She is alert. She is disoriented.     (all labs ordered are listed, but only abnormal results are displayed) Labs Reviewed  COMPREHENSIVE METABOLIC PANEL WITH GFR - Abnormal; Notable for the following components:      Result Value   Potassium 5.2 (*)    CO2 18 (*)    Glucose, Bld 222 (*)    BUN 47 (*)    Creatinine, Ser 2.49 (*)  Total Protein 8.4 (*)    Albumin 3.3 (*)    GFR, Estimated 21 (*)    All other components within normal limits  CBC - Abnormal; Notable for the following components:   WBC 14.9 (*)    RBC 3.66 (*)    Hemoglobin 10.3 (*)    HCT 31.8 (*)    Platelets 111 (*)    All other components within normal limits  PROTIME-INR - Abnormal; Notable for the following components:   Prothrombin Time 17.1 (*)    INR 1.3 (*)    All other components within normal limits  CK - Abnormal; Notable for the following components:   Total CK 254 (*)    All other components within normal limits  HEMOGLOBIN A1C - Abnormal; Notable for the following components:   Hgb A1c MFr Bld 7.2 (*)    All other components within normal limits  GLUCOSE, CAPILLARY - Abnormal; Notable for the following components:   Glucose-Capillary 55 (*)    All other components within normal limits  GLUCOSE, CAPILLARY - Abnormal; Notable for the following components:   Glucose-Capillary 64 (*)    All other components within normal limits  I-STAT CHEM 8, ED - Abnormal; Notable for the following components:   Potassium 5.2 (*)    BUN 41 (*)    Creatinine, Ser 2.70 (*)    Glucose, Bld 231 (*)    TCO2 19 (*)    Hemoglobin 10.2 (*)    HCT 30.0 (*)    All other components within normal limits  RESP PANEL BY RT-PCR (RSV, FLU A&B, COVID)  RVPGX2  CULTURE, BLOOD (ROUTINE X 2)  CULTURE, BLOOD (ROUTINE X 2)  ETHANOL  GLUCOSE, CAPILLARY  URINALYSIS, ROUTINE W REFLEX MICROSCOPIC  HIV ANTIBODY (ROUTINE  TESTING W REFLEX)  MAGNESIUM  BASIC METABOLIC PANEL WITH GFR  CBC  I-STAT CG4 LACTIC ACID, ED    EKG: EKG Interpretation Date/Time:  Saturday November 16 2023 14:28:23 EDT Ventricular Rate:  104 PR Interval:  223 QRS Duration:  92 QT Interval:  341 QTC Calculation: 449 R Axis:   23  Text Interpretation: Sinus tachycardia Prolonged PR interval Probable left atrial enlargement no significant change since 2020 Confirmed by Freddi Hamilton [phone]) on 11/16/2023 3:45:30 PM  Radiology: [facility]COLA Femur Min 2 Views Left Result Date: 11/16/2023 CLINICAL DATA:  Fall. EXAM: LEFT FEMUR 2 VIEWS; LEFT TIBIA AND FIBULA - 2 VIEW; RIGHT KNEE - COMPLETE 4+ VIEW COMPARISON:  08/28/2023. FINDINGS: Left femur: There is no evidence of acute fracture or dislocation. Degenerative changes are present at the knee. Vascular calcifications are noted in the soft tissues. Right knee: There is no evidence of acute fracture or dislocation. Moderate to severe degenerative changes are noted at the knee, most pronounced in the medial and patellofemoral compartments. No joint effusion. Vascular calcifications are present in the soft tissues. Left tibia and fibula: There is no acute fracture or dislocation. Moderate degenerative changes are noted at the knee and most pronounced in the medial patellofemoral compartments. No joint effusion is seen. Vascular calcifications are noted. IMPRESSION: 1. No acute fracture or dislocation. 2. Degenerative changes at the knees bilaterally. Electronically Signed   By: Leita Birmingham M.D.   On: 11/16/2023 17:26   DG Tibia/Fibula Left Result Date: 11/16/2023 CLINICAL DATA:  Fall. EXAM: LEFT FEMUR 2 VIEWS; LEFT TIBIA AND FIBULA - 2 VIEW; RIGHT KNEE - COMPLETE 4+ VIEW COMPARISON:  08/28/2023. FINDINGS: Left femur: There is no evidence of acute fracture or dislocation.  Degenerative changes are present at the knee. Vascular calcifications are noted in the soft tissues. Right knee: There is no evidence of  acute fracture or dislocation. Moderate to severe degenerative changes are noted at the knee, most pronounced in the medial and patellofemoral compartments. No joint effusion. Vascular calcifications are present in the soft tissues. Left tibia and fibula: There is no acute fracture or dislocation. Moderate degenerative changes are noted at the knee and most pronounced in the medial patellofemoral compartments. No joint effusion is seen. Vascular calcifications are noted. IMPRESSION: 1. No acute fracture or dislocation. 2. Degenerative changes at the knees bilaterally. Electronically Signed   By: Leita Birmingham M.D.   On: 11/16/2023 17:26   DG Knee Complete 4 Views Right Result Date: 11/16/2023 CLINICAL DATA:  Fall. EXAM: LEFT FEMUR 2 VIEWS; LEFT TIBIA AND FIBULA - 2 VIEW; RIGHT KNEE - COMPLETE 4+ VIEW COMPARISON:  08/28/2023. FINDINGS: Left femur: There is no evidence of acute fracture or dislocation. Degenerative changes are present at the knee. Vascular calcifications are noted in the soft tissues. Right knee: There is no evidence of acute fracture or dislocation. Moderate to severe degenerative changes are noted at the knee, most pronounced in the medial and patellofemoral compartments. No joint effusion. Vascular calcifications are present in the soft tissues. Left tibia and fibula: There is no acute fracture or dislocation. Moderate degenerative changes are noted at the knee and most pronounced in the medial patellofemoral compartments. No joint effusion is seen. Vascular calcifications are noted. IMPRESSION: 1. No acute fracture or dislocation. 2. Degenerative changes at the knees bilaterally. Electronically Signed   By: Leita Birmingham M.D.   On: 11/16/2023 17:26   VAS US  LOWER EXTREMITY VENOUS (DVT) (7a-7p) Result Date: 11/16/2023  Lower Venous DVT Study Patient Name:  GELILA WELL  Date of Exam:   11/16/2023 Medical Rec #: [id number]         Accession #:    [id number] Date of Birth: [birth year]           Patient Gender: F Patient Age:   28 years Exam Location:  [facility] Procedure:      VAS US  LOWER EXTREMITY VENOUS (DVT) Referring Phys: GLENDIA Cid [employer] --------------------------------------------------------------------------------  Indications: Pain, Swelling, Erythema, and Status post fall in shower today. Patient fell onto left lower extremity. Unknown downtime.  Limitations: Body habitus, poor ultrasound/tissue interface and musculoskeletal features. Comparison Study: Prior negative left LEV done 09/24/21. Negative left LE reflux                   study done 11/24/21 Performing Technologist: Alberta Lis RVS  Examination Guidelines: A complete evaluation includes B-mode imaging, spectral Doppler, color Doppler, and power Doppler as needed of all accessible portions of each vessel. Bilateral testing is considered an integral part of a complete examination. Limited examinations for reoccurring indications may be performed as noted. The reflux portion of the exam is performed with the patient in reverse Trendelenburg.  +-----+---------------+---------+-----------+----------+--------------+ RIGHTCompressibilityPhasicitySpontaneityPropertiesThrombus Aging +-----+---------------+---------+-----------+----------+--------------+ CFV  Full           Yes      No                                  +-----+---------------+---------+-----------+----------+--------------+ SFJ  Full                                                        +-----+---------------+---------+-----------+----------+--------------+   +---------+---------------+---------+-----------+----------+--------------+  LEFT     CompressibilityPhasicitySpontaneityPropertiesThrombus Aging +---------+---------------+---------+-----------+----------+--------------+ CFV      Full           Yes      No                                  +---------+---------------+---------+-----------+----------+--------------+ SFJ      Full                                                         +---------+---------------+---------+-----------+----------+--------------+ FV Prox  Full           Yes      No                                  +---------+---------------+---------+-----------+----------+--------------+ FV Mid   Full           Yes      No                                  +---------+---------------+---------+-----------+----------+--------------+ FV DistalFull           Yes      No                                  +---------+---------------+---------+-----------+----------+--------------+ PFV      Full           Yes      No                                  +---------+---------------+---------+-----------+----------+--------------+ POP      Full           Yes      No                                  +---------+---------------+---------+-----------+----------+--------------+ PTV      Full                                                        +---------+---------------+---------+-----------+----------+--------------+ PERO     Full                                                        +---------+---------------+---------+-----------+----------+--------------+ Gastroc  Full                                                        +---------+---------------+---------+-----------+----------+--------------+    Summary: RIGHT: -  No evidence of deep vein thrombosis in the lower extremity. No indirect evidence of obstruction proximal to the inguinal ligament.  Pulsatile waveforms  LEFT: - There is no evidence of deep vein thrombosis in the lower extremity.  - No cystic structure found in the popliteal fossa. - Ultrasound characteristics of enlarged lymph nodes noted in the groin. Pulsatile waveforms.  *See table(s) above for measurements and observations.    Preliminary    CT CERVICAL SPINE WO CONTRAST Result Date: 11/16/2023 CLINICAL DATA:  Clemens, trauma EXAM: CT CERVICAL SPINE WITHOUT CONTRAST  TECHNIQUE: Multidetector CT imaging of the cervical spine was performed without intravenous contrast. Multiplanar CT image reconstructions were also generated. RADIATION DOSE REDUCTION: This exam was performed according to the departmental dose-optimization program which includes automated exposure control, adjustment of the mA and/or kV according to patient size and/or use of iterative reconstruction technique. COMPARISON:  None Available. FINDINGS: Alignment: Slight reversal cervical lordosis is likely positional. Otherwise alignment is anatomic. Skull base and vertebrae: No acute fracture. No primary bone lesion or focal pathologic process. Soft tissues and spinal canal: No prevertebral fluid or swelling. No visible canal hematoma. Diffusely heterogeneous appearance of the thyroid , with a 2.4 cm hypodense nodule within the left lobe. Disc levels: Multilevel spondylosis with disc space narrowing and osteophyte formation most pronounced from C4-5 through C6-7. No significant bony encroachment upon the central canal or neural foramina. Upper chest: Airway is patent.  Lung apices are clear. Other: Reconstructed images demonstrate no additional findings. IMPRESSION: 1. No acute cervical spine fracture. 2. Multilevel cervical spondylosis as above. 3. Incidental left thyroid  nodule measuring 2.4 cm. Recommend non-emergent thyroid  ultrasound. Reference: J Am Coll Radiol. 2015 Feb;12(2): 143-50 Electronically Signed   By: Ozell Daring M.D.   On: 11/16/2023 15:25   CT HEAD WO CONTRAST Result Date: 11/16/2023 CLINICAL DATA:  Clemens, head trauma EXAM: CT HEAD WITHOUT CONTRAST TECHNIQUE: Contiguous axial images were obtained from the base of the skull through the vertex without intravenous contrast. RADIATION DOSE REDUCTION: This exam was performed according to the departmental dose-optimization program which includes automated exposure control, adjustment of the mA and/or kV according to patient size and/or use of  iterative reconstruction technique. COMPARISON:  08/28/2023 FINDINGS: Brain: No acute infarct or hemorrhage. Lateral ventricles and midline structures appear unremarkable. No acute extra-axial fluid collections. No mass effect. Vascular: No hyperdense vessel or unexpected calcification. Skull: Normal. Negative for fracture or focal lesion. Sinuses/Orbits: Minimal fluid within the left maxillary sinus. Remaining paranasal sinuses are clear. Other: None. IMPRESSION: 1. No acute intracranial process. Electronically Signed   By: Ozell Daring M.D.   On: 11/16/2023 15:22   DG Chest Port 1 View Result Date: 11/16/2023 CLINICAL DATA:  Status post fall. EXAM: PORTABLE CHEST 1 VIEW COMPARISON:  November 07, 2018 FINDINGS: The heart size and mediastinal contours are within normal limits. Both lungs are clear. Multilevel degenerative changes seen throughout the thoracic spine. IMPRESSION: No active disease. Electronically Signed   By: Suzen Dials M.D.   On: 11/16/2023 14:57   DG Pelvis Portable Result Date: 11/16/2023 CLINICAL DATA:  Status post fall. EXAM: PORTABLE PELVIS 1-2 VIEWS COMPARISON:  None Available. FINDINGS: There is no evidence of an acute pelvic fracture or diastasis. No pelvic bone lesions are seen. Degenerative changes are seen involving both hips. IMPRESSION: 1. No acute fracture or diastasis. 2. Degenerative changes involving both hips. Electronically Signed   By: Suzen Dials M.D.   On: 11/16/2023 14:55     .Critical Care  Performed by: Freddi Hamilton, MD Authorized by: Freddi Hamilton, MD   Critical care provider statement:    Critical care time (minutes):  30   Critical care time was exclusive of:  Separately billable procedures and treating other patients   Critical care was necessary to treat or prevent imminent or life-threatening deterioration of the following conditions:  Sepsis   Critical care was time spent personally by me on the following activities:  Development of  treatment plan with patient or surrogate, discussions with consultants, evaluation of patient's response to treatment, examination of patient, ordering and review of laboratory studies, ordering and review of radiographic studies, ordering and performing treatments and interventions, pulse oximetry, re-evaluation of patient's condition and review of old charts    Medications Ordered in the ED  acetaminophen  (TYLENOL ) tablet 650 mg (has no administration in time range)    Or  acetaminophen  (TYLENOL ) suppository 650 mg (has no administration in time range)  bisacodyl  (DULCOLAX) EC tablet 5 mg (has no administration in time range)  HYDROmorphone  (DILAUDID ) injection 0.5 mg (0.5 mg Intravenous Patient Refused/Not Given 11/16/23 2004)  oxyCODONE  (Oxy IR/ROXICODONE ) immediate release tablet 5 mg (has no administration in time range)  heparin  injection 5,000 Units (5,000 Units Subcutaneous Given 11/16/23 2122)  vancomycin  variable dose per unstable renal function (pharmacist dosing) (has no administration in time range)  allopurinol  (ZYLOPRIM ) tablet 150 mg (has no administration in time range)  ALPRAZolam  (XANAX ) tablet 0.25 mg (has no administration in time range)  pantoprazole  (PROTONIX ) EC tablet 20 mg (20 mg Oral Given 11/16/23 2122)  pregabalin  (LYRICA ) capsule 150 mg (150 mg Oral Given 11/16/23 2121)  multivitamin (RENA-VIT) tablet 1 tablet (1 tablet Oral Given 11/16/23 2122)  insulin  aspart protamine- aspart (NOVOLOG  MIX 70/30) injection 30 Units (has no administration in time range)  insulin  aspart (novoLOG ) injection 0-6 Units (has no administration in time range)  ceFEPIme  (MAXIPIME ) 1 g in sodium chloride  0.9 % 100 mL IVPB (has no administration in time range)  lactated ringers  bolus 1,000 mL (0 mLs Intravenous Stopped 11/16/23 1648)  ceFEPIme  (MAXIPIME ) 2 g in sodium chloride  0.9 % 100 mL IVPB (0 g Intravenous Stopped 11/16/23 1648)  metroNIDAZOLE  (FLAGYL ) IVPB 500 mg (0 mg Intravenous Stopped 11/16/23  1901)  vancomycin  (VANCOREADY) IVPB 2000 mg/400 mL (0 mg Intravenous Stopped 11/16/23 2117)  fentaNYL  (SUBLIMAZE ) injection 50 mcg (50 mcg Intravenous Given 11/16/23 1757)  sodium chloride  0.9 % bolus 1,000 mL (1,000 mLs Intravenous New Bag/Given 11/16/23 1900)                                    Medical Decision Making We show evidence of GI  Amount and/or Complexity of Data Reviewed Independent Historian: spouse Labs: ordered.    Details: Leukocytosis, AKI. Radiology: ordered and independent interpretation performed.    Details: No fractures or head bleed ECG/medicine tests: independent interpretation performed.    Details: Sinus tachycardia  Risk Prescription drug management. Decision regarding hospitalization.   Patient presents after a fall.  No significant traumatic injury found such as a fracture.  No head bleed.  C-collar was removed after negative workup as she has no neck pain.  However she is found to have SIRS criteria with leukocytosis and tachycardia.  I am concerned that she has sepsis from cellulitis.  Her lactate is okay but she does have an AKI and a new confusion.  Given broad IV antibiotics as well as  IV fluids.  She will need admission, discussed with Dr. Arthea.  While she does have some mild tenderness diffusely in her left lower extremity, my concern for necrotizing fasciitis is low.     Final diagnoses:  Acute kidney injury (HCC)  Sepsis due to cellulitis [facility])    ED Discharge Orders     None          Freddi Hamilton, MD 11/16/23 2235

## 2023-11-16 NOTE — Progress Notes (Signed)
 Pharmacy Antibiotic Note  Lacey Meyer is a 63 y.o. female admitted on 11/16/2023 with cellulitis.  Pharmacy has been consulted for cefepime  and vancomycin  dosing. The patient has AKI with CrCl ~20 ml/min. Vancomycin  load given in the ED and will need to be redosed based on levels.  Plan: Cefepime  1G IV once daily (renally adjusted) Vancomycin  per pharmacy for variable renal function Monitor renal function and clinical progression  Height: 5' 3 (160 cm) Weight: 80.2 kg (176 lb 12.9 oz) IBW/kg (Calculated) : 52.4  Temp (24hrs), Avg:99.1 F (37.3 C), Min:98.5 F (36.9 C), Max:99.7 F (37.6 C)  Recent Labs  Lab 11/16/23 1430 11/16/23 1451 11/16/23 1452  WBC 14.9*  --   --   CREATININE 2.49* 2.70*  --   LATICACIDVEN  --   --  1.7    Estimated Creatinine Clearance: 20.8 mL/min (A) (by C-G formula based on SCr of 2.7 mg/dL (H)).    Allergies  Allergen Reactions   Ampicillin Anaphylaxis, Hives and Other (See Comments)    Has patient had a PCN reaction causing immediate rash, facial/tongue/throat swelling, SOB or lightheadedness with hypotension: Yes Has patient had a PCN reaction causing severe rash involving mucus membranes or skin necrosis: Yes Has patient had a PCN reaction that required hospitalization Yes Has patient had a PCN reaction occurring within the last 10 years: No If all of the above answers are NO, then may proceed with Cephalosporin use.    Penicillins Anaphylaxis, Hives, Nausea And Vomiting and Other (See Comments)    Has patient had a PCN reaction causing immediate rash, facial/tongue/throat swelling, SOB or lightheadedness with hypotension: Yes  Has patient had a PCN reaction causing severe rash involving mucus membranes or skin necrosis: Yes  Has patient had a PCN reaction that required hospitalization Yes  Has patient had a PCN reaction occurring within the last 10 years: No  If all of the above answers are NO, then may proceed with Cephalosporin  use.  Has patient had a PCN reaction causing immediate rash, facial/tongue/throat swelling, SOB or lightheadedness with hypotension: Yes, Has patient had a PCN reaction causing severe rash involving mucus membranes or skin necrosis: Yes, Has patient had a PCN reaction that required hospitalization Yes, Has patient had a PCN reaction occurring within the last 10 years: No, If all of the above answers are NO, then may proceed with Cephalosporin use.   Doxycycline Other (See Comments)    unknown   Other Swelling, Rash and Other (See Comments)    Blistex     Thank you for allowing pharmacy to be a part of this patient's care.  Koren CROME Allyn Bertoni 11/16/2023 8:02 PM

## 2023-11-16 NOTE — H&P (Signed)
 History and Physical    Patient: Lacey Meyer FMW:[id number] DOB: [birth year] DOA: 11/16/2023 DOS: the patient was seen and examined on 11/16/2023 PCP: Shelda Atlas, MD  Patient coming from: Home  Chief Complaint:  Chief Complaint  Patient presents with   Fall   HPI: Lacey Meyer is a 63 y.o. female with medical history significant for diabetes mellitus, visual impairment, hypertension,chronic leg pain on Oxy 10mg  followed by pain management, h/o gastric bypass presents after being found down on the bathroom floor by her daughter.  Patient was not able to get up.  She suspects that the patient was down for few hours.  The patient is sobbing in pain and not able to provide any history.  The daughter says this is the second time this week that she has found her mom down on the floor. The patient is sobbing continuously and not able to provide much history.  She did receive some pain medication in the ER then she went to sleep and when she woke up she started sobbing again. The daughter reports that the family has been very concerned about her left leg for many months now.  It is discolored and swollen and painful and abnormal.  Patient does follow with the pain clinic and is on oxycodone  for the pain.  She says she takes 1 every 6 hours.  The daughter says that much of the time the patient is drowsy she thinks this is contributing to her frequent falls.  They would really like to know what the problem in the leg is and what is causing the pain instead of just having the patient on pain medication.  The patient's brother had a similar looking lower extremity and he ended up having an amputation.  Review of Systems: unable to review all systems due to the inability of the patient to answer questions. 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 Her smoking use included cigarettes. She started smoking about 14 years ago. She has a 0.3 pack-year smoking history. She has never used smokeless tobacco. She reports that she does not drink alcohol and does not use drugs.  Allergies  Allergen Reactions   Ampicillin Anaphylaxis, Hives and Other (See Comments)    Has patient had a PCN reaction causing immediate rash, facial/tongue/throat swelling, SOB or lightheadedness with hypotension: Yes Has patient had a PCN reaction causing severe rash involving mucus membranes or skin necrosis: Yes Has patient had a PCN reaction that required hospitalization Yes Has patient had a PCN reaction occurring within the last 10 years: No If all of the above  answers are NO, then may proceed with Cephalosporin use.    Penicillins Anaphylaxis, Hives, Nausea And Vomiting and Other (See Comments)    Has patient had a PCN reaction causing immediate rash, facial/tongue/throat swelling, SOB or lightheadedness with hypotension: Yes  Has patient had a PCN reaction causing severe rash involving mucus membranes or skin necrosis: Yes  Has patient had a PCN reaction that required hospitalization Yes  Has patient had a PCN reaction occurring within the last 10 years: No  If all of the above answers are NO, then may proceed with Cephalosporin use.  Has patient had a PCN reaction causing  immediate rash, facial/tongue/throat swelling, SOB or lightheadedness with hypotension: Yes, Has patient had a PCN reaction causing severe rash involving mucus membranes or skin necrosis: Yes, Has patient had a PCN reaction that required hospitalization Yes, Has patient had a PCN reaction occurring within the last 10 years: No, If all of the above answers are NO, then may proceed with Cephalosporin use.   Doxycycline Other (See Comments)    unknown   Other Swelling, Rash and Other (See Comments)    Blistex     Family History  Problem Relation Age of Onset   Hypertension Mother    Hypertension Father    Breast cancer Sister        62s    Prior to Admission medications   Medication Sig Start Date End Date Taking? Authorizing Provider  allopurinol  (ZYLOPRIM ) 300 MG tablet Take by mouth. 10/08/18  Yes [provider]  ALPRAZolam  (XANAX ) 0.25 MG tablet Take 0.25 mg by mouth daily as needed for anxiety. 05/28/16  Yes [provider]  benazepril  (LOTENSIN ) 10 MG tablet Take 10 mg by mouth daily. 05/09/16  Yes [provider]  colchicine  0.6 MG tablet Take by mouth.   Yes [provider]  furosemide  (LASIX ) 40 MG tablet Take 1 tablet (40 mg total) by mouth daily. 06/18/16  Yes Ricky Fines, MD  glimepiride  (AMARYL ) 4 MG tablet Take 4 mg by mouth 2 (two) times daily.  10/16/15  Yes [provider]  insulin  NPH-regular Human (70-30) 100 UNIT/ML injection Inject 0-1 Units into the skin.   Yes [provider]  LYRICA  200 MG capsule Take 200 mg by mouth 4 (four) times daily.  08/04/15  Yes [provider]  metoprolol  succinate (TOPROL -XL) 50 MG 24 hr tablet Take 50 mg by mouth daily.  12/28/15  Yes [provider]  Multiple Vitamins-Minerals (BARIATRIC FUSION PO) Take 1 tablet by mouth daily.   Yes [provider]  naloxone (NARCAN) 4 MG/0.1ML LIQD nasal spray kit PLEASE SEE ATTACHED FOR DETAILED DIRECTIONS 07/15/18  Yes  [provider]  NOVOLOG  FLEXPEN 100 UNIT/ML FlexPen Inject 2-15 Units into the skin 3 (three) times daily with meals.  04/23/16  Yes [provider]  ACCU-CHEK GUIDE test strip USE TO TEST THREE TIMES DAILY (E11.65) 03/17/18   [provider]  B-D UF III MINI PEN NEEDLES 31G X 5 MM MISC  03/08/18   [provider]  fluticasone  (FLONASE ) 50 MCG/ACT nasal spray Place 1 spray into both nostrils daily. Patient not taking: Reported on 11/16/2023 08/19/21   Teresa Shelba SAUNDERS, NP  Lancets [facility] DELICA PLUS Honeygo) MISC CHECK GLUCOSE 3 TIMES A DAY 01/17/18   [provider]  lidocaine  (XYLOCAINE ) 2 % solution Use as directed 15 mLs in the mouth or throat as needed for mouth pain. Patient not taking: Reported on 11/16/2023 08/19/21  White, Adrienne R, NP  olmesartan (BENICAR) 20 MG tablet Take 20 mg by mouth daily. 08/04/21   [provider]  Olopatadine HCl 0.2 % SOLN INSTILL 1 DROP IN EACH EYE DAILY AS NEEDED 01/17/18   [provider]  omeprazole  (PRILOSEC) 20 MG capsule Take 1 capsule (20 mg total) by mouth daily. 11/10/18 12/10/18  Almeda Bernard, MD  ondansetron  (ZOFRAN -ODT) 4 MG disintegrating tablet DISSOLVE 1 TABLET IN MOUTH EVERY 8 HOURS AS NEEDED FOR UP TO 7 DAYS FOR NAUSEA OR VOMITING 11/10/18   [provider]  Oxycodone  HCl 10 MG TABS Take 10 mg by mouth every 8 (eight) hours as needed. 02/29/16   [provider]  pantoprazole  (PROTONIX ) 40 MG tablet  01/24/19   [provider]  simvastatin  (ZOCOR ) 40 MG tablet  03/01/19   [provider]  spironolactone  (ALDACTONE ) 25 MG tablet Take by mouth.    [provider]  tiZANidine (ZANAFLEX) 2 MG tablet TAKE 1 TABLET BY MOUTH AT BEDTIME AS NEEDED FOR PAINS 12/05/17   [provider]  triamcinolone  cream (KENALOG ) 0.5 % APPLY CREAM TOPICALLY TO LEGS TWICE A DAY AS NEEDED 01/17/18   [provider]  venlafaxine XR (EFFEXOR-XR)  150 MG 24 hr capsule Take 150 mg by mouth daily with breakfast.    [provider]  VENTOLIN  HFA 108 (90 Base) MCG/ACT inhaler  06/12/19   [provider]  Vitamin D , Ergocalciferol , (DRISDOL ) 1.25 MG (50000 UNIT) CAPS capsule  05/23/19   [provider]  XULTOPHY 100-3.6 UNIT-MG/ML SOPN INJECT 20 UNITS SUBCUTANEOUSLY ONCE DAILY IN THE MORNING 11/07/17   [provider]  zolpidem (AMBIEN) 10 MG tablet Take 10 mg by mouth at bedtime as needed. 12/02/17   [provider]    Physical Exam: Vitals:   11/16/23 1452 11/16/23 1500 11/16/23 1515 11/16/23 1830  BP:  (!) 139/93 (!) 138/56 118/65  Pulse: 99 (!) 104 (!) 102 (!) 31  Resp: 17 (!) 25 17 13   Temp:      TempSrc:      SpO2: 100% 100% 100% (!) 69%   Physical Exam:  General: No acute distress, crying. obese HEENT: Normocephalic, atraumatic, PERRL Cardiovascular: Normal rate and rhythm. Distal pulses intact. Pulmonary: Normal pulmonary effort, normal breath sounds Gastrointestinal: Nondistended abdomen, soft, non-tender, normoactive bowel sounds Musculoskeletal: DP pulses are not palpable Right lower extremity does not have edema There is a well-healed surgical scar on the top of her right foot.  She does have pain when that foot is squeezed or lifted The left lower has what appears to be chronic edema from the knee down.  It is hyperpigmented and there are areas of abnormality that almost look like chronic hives.  She seems to be most tender about 6 inches above the left ankle.  She does not have tenderness/ synovitis in the ankle. Could not evaluate the sores on the back of her lower leg because of pain with moving her leg Lymphadenopathy: No cervical LAD. Neuro: No focal deficits noted, Diffusely weak. AAOx3. PSYCH: crying but cooperative  Data Reviewed:  Results for orders placed or performed during the hospital encounter of 11/16/23 (from the past 24 hours)  Comprehensive metabolic panel      Status: Abnormal   Collection Time: 11/16/23  2:30 PM  Result Value Ref Range   Sodium 137 135 - 145 mmol/L   Potassium 5.2 (H) 3.5 - 5.1 mmol/L   Chloride 106 98 - 111 mmol/L   CO2  18 (L) 22 - 32 mmol/L   Glucose, Bld 222 (H) 70 - 99 mg/dL   BUN 47 (H) 8 - 23 mg/dL   Creatinine, Ser 7.50 (H) 0.44 - 1.00 mg/dL   Calcium 9.0 8.9 - 89.6 mg/dL   Total Protein 8.4 (H) 6.5 - 8.1 g/dL   Albumin 3.3 (L) 3.5 - 5.0 g/dL   AST 24 15 - 41 U/L   ALT 23 0 - 44 U/L   Alkaline Phosphatase 57 38 - 126 U/L   Total Bilirubin 0.6 0.0 - 1.2 mg/dL   GFR, Estimated 21 (L) >60 mL/min   Anion gap 13 5 - 15  CBC     Status: Abnormal   Collection Time: 11/16/23  2:30 PM  Result Value Ref Range   WBC 14.9 (H) 4.0 - 10.5 K/uL   RBC 3.66 (L) 3.87 - 5.11 MIL/uL   Hemoglobin 10.3 (L) 12.0 - 15.0 g/dL   HCT 68.1 (L) 63.9 - 53.9 %   MCV 86.9 80.0 - 100.0 fL   MCH 28.1 26.0 - 34.0 pg   MCHC 32.4 30.0 - 36.0 g/dL   RDW 84.4 88.4 - 84.4 %   Platelets 111 (L) 150 - 400 K/uL   nRBC 0.0 0.0 - 0.2 %  Ethanol     Status: None   Collection Time: 11/16/23  2:30 PM  Result Value Ref Range   Alcohol, Ethyl (B) <15 <15 mg/dL  Protime-INR     Status: Abnormal   Collection Time: 11/16/23  2:30 PM  Result Value Ref Range   Prothrombin Time 17.1 (H) 11.4 - 15.2 seconds   INR 1.3 (H) 0.8 - 1.2  CK     Status: Abnormal   Collection Time: 11/16/23  2:30 PM  Result Value Ref Range   Total CK 254 (H) 38 - 234 U/L  I-Stat Chem 8, ED     Status: Abnormal   Collection Time: 11/16/23  2:51 PM  Result Value Ref Range   Sodium 138 135 - 145 mmol/L   Potassium 5.2 (H) 3.5 - 5.1 mmol/L   Chloride 110 98 - 111 mmol/L   BUN 41 (H) 8 - 23 mg/dL   Creatinine, Ser 7.29 (H) 0.44 - 1.00 mg/dL   Glucose, Bld 768 (H) 70 - 99 mg/dL   Calcium, Ion 8.83 8.84 - 1.40 mmol/L   TCO2 19 (L) 22 - 32 mmol/L   Hemoglobin 10.2 (L) 12.0 - 15.0 g/dL   HCT 69.9 (L) 63.9 - 53.9 %  I-Stat Lactic Acid, ED     Status: None   Collection Time:  11/16/23  2:52 PM  Result Value Ref Range   Lactic Acid, Venous 1.7 0.5 - 1.9 mmol/L  Resp panel by RT-PCR (RSV, Flu A&B, Covid) Anterior Nasal Swab     Status: None   Collection Time: 11/16/23  3:12 PM   Specimen: Anterior Nasal Swab  Result Value Ref Range   SARS Coronavirus 2 by RT PCR NEGATIVE NEGATIVE   Influenza A by PCR NEGATIVE NEGATIVE   Influenza B by PCR NEGATIVE NEGATIVE   Resp Syncytial Virus by PCR NEGATIVE NEGATIVE     Assessment and Plan: Sepsis 2. Cellulitis 3. Fall with bilateral leg pain 4. Acute on chronic pain  - Vanc and Maxipime  per cellulitis protocol - PT/OT - She will need a vascular assessment of the her legs to evaluate for ischemia once her creatinine improves. Neuropathy does not explain the leg pain.  A CTA can also  evaluate for osteo or micro trauma. - Close management of her oxycodone  use  - Consider rehab after this hospitalization  5. AKI - IVF.  - Follow creatinine - Hold AIIRB until creatinine improves - Discontinue ACE inhibitor as she does not need to be on both ACE inhibitor and AIIRB - Hold all nephrotoxic agents  6. DMT2 -will continue her insulin  but hold oral agents.  Add corrective dose insulin  as needed.   Advance Care Planning:   Code Status: Prior  Discussion deferred due to patient sobbing.  She will be full code by default  Consults: none  Family Communication: 2 family members at bedside  Severity of Illness: The appropriate patient status for this patient is INPATIENT. Inpatient status is judged to be reasonable and necessary in order to provide the required intensity of service to ensure the patient's safety. The patient's presenting symptoms, physical exam findings, and initial radiographic and laboratory data in the context of their chronic comorbidities is felt to place them at high risk for further clinical deterioration. Furthermore, it is not anticipated that the patient will be medically stable for discharge from  the hospital within 2 midnights of admission.   * I certify that at the point of admission it is my clinical judgment that the patient will require inpatient hospital care spanning beyond 2 midnights from the point of admission due to high intensity of service, high risk for further deterioration and high frequency of surveillance required.*  Author: ARTHEA CHILD, MD 11/16/2023 6:42 PM  For on call review [email or web address].

## 2023-11-16 NOTE — Progress Notes (Signed)
 VASCULAR LAB    Left lower extremity venous duplex has been performed.  See CV proc for preliminary results.  Gave verbal report to Dr. Freddi LIS, [facility], RVT 11/16/2023, 5:02 PM

## 2023-11-17 DIAGNOSIS — A419 Sepsis, unspecified organism: Secondary | ICD-10-CM | POA: Diagnosis not present

## 2023-11-17 LAB — HIV ANTIBODY (ROUTINE TESTING W REFLEX): HIV Screen 4th Generation wRfx: NONREACTIVE

## 2023-11-17 LAB — GLUCOSE, CAPILLARY
Glucose-Capillary: 83 mg/dL (ref 70–99)
Glucose-Capillary: 101 mg/dL — ABNORMAL HIGH (ref 70–99)
Glucose-Capillary: 62 mg/dL — ABNORMAL LOW (ref 70–99)
Glucose-Capillary: 71 mg/dL (ref 70–99)
Glucose-Capillary: 141 mg/dL — ABNORMAL HIGH (ref 70–99)

## 2023-11-17 LAB — CBC
HCT: 30.7 % — ABNORMAL LOW (ref 36.0–46.0)
Hemoglobin: 10.1 g/dL — ABNORMAL LOW (ref 12.0–15.0)
MCH: 28.3 pg (ref 26.0–34.0)
MCHC: 32.9 g/dL (ref 30.0–36.0)
MCV: 86 fL (ref 80.0–100.0)
Platelets: 104 10*3/uL — ABNORMAL LOW (ref 150–400)
RBC: 3.57 MIL/uL — ABNORMAL LOW (ref 3.87–5.11)
RDW: 15.6 % — ABNORMAL HIGH (ref 11.5–15.5)
WBC: 8.7 10*3/uL (ref 4.0–10.5)
nRBC: 0 % (ref 0.0–0.2)

## 2023-11-17 LAB — BASIC METABOLIC PANEL WITH GFR
Anion gap: 14 (ref 5–15)
BUN: 39 mg/dL — ABNORMAL HIGH (ref 8–23)
CO2: 17 mmol/L — ABNORMAL LOW (ref 22–32)
Calcium: 9.1 mg/dL (ref 8.9–10.3)
Chloride: 106 mmol/L (ref 98–111)
Creatinine, Ser: 2.12 mg/dL — ABNORMAL HIGH (ref 0.44–1.00)
GFR, Estimated: 25 mL/min — ABNORMAL LOW
Glucose, Bld: 77 mg/dL (ref 70–99)
Potassium: 4.5 mmol/L (ref 3.5–5.1)
Sodium: 137 mmol/L (ref 135–145)

## 2023-11-17 LAB — MAGNESIUM: Magnesium: 1.7 mg/dL (ref 1.7–2.4)

## 2023-11-17 MED ORDER — CEFAZOLIN SODIUM-DEXTROSE 2-4 GM/100ML-% IV SOLN
2.0000 g | Freq: Two times a day (BID) | INTRAVENOUS | Status: DC
  Administered 2023-11-17 – 2023-11-19 (×5): 2 g via INTRAVENOUS
  Filled 2023-11-17 (×5): qty 100

## 2023-11-17 MED ORDER — OXYCODONE HCL 5 MG PO TABS
10.0000 mg | ORAL_TABLET | Freq: Four times a day (QID) | ORAL | Status: DC | PRN
  Administered 2023-11-17 – 2023-11-20 (×8): 10 mg via ORAL
  Filled 2023-11-17 (×8): qty 2

## 2023-11-17 MED ORDER — PREGABALIN 75 MG PO CAPS
150.0000 mg | ORAL_CAPSULE | Freq: Two times a day (BID) | ORAL | Status: DC
  Administered 2023-11-17 – 2023-11-19 (×4): 150 mg via ORAL
  Filled 2023-11-17 (×3): qty 2
  Filled 2023-11-17: qty 6

## 2023-11-17 MED ORDER — DEXTROSE 5 % IV SOLN: INTRAVENOUS | Status: DC

## 2023-11-17 MED ORDER — LACTATED RINGERS IV SOLN: INTRAVENOUS | Status: DC

## 2023-11-17 MED ORDER — DOCUSATE SODIUM 100 MG PO CAPS
200.0000 mg | ORAL_CAPSULE | Freq: Every day | ORAL | Status: DC
  Administered 2023-11-17 – 2023-11-19 (×3): 200 mg via ORAL
  Filled 2023-11-17 (×3): qty 2

## 2023-11-17 NOTE — Progress Notes (Signed)
 Through diet, pt has lost 80 lbs, she states that her dr had her stop taking the mixed insulin  about a month ago.

## 2023-11-17 NOTE — Assessment & Plan Note (Signed)
 Glucose low - Hold 70/30 - Continue SS corrections

## 2023-11-17 NOTE — Consult Note (Signed)
 WOC Nurse Consult Note: per daughter patient has had worsening edema and L leg pain for months; last seen by vascular surgeon in 2023 at which time she had B lower leg edema and venous stasis changes to skin  Reason for Consult: L leg wounds  Wound type: partial and full thickness wounds to L lower leg r/t ? Venous insufficiency  Pressure Injury POA: NA  Measurement: see nursing flowsheet  Wound bed: pink moist Drainage (amount, consistency, odor) see nursing flowsheet  Periwound: dark discoloration to anterior leg noted  Dressing procedure/placement/frequency:  Cleanse L lower leg (intact skin and wounds) with Vashe wound cleanser Soila [phone]) do not rinse and allow to air dry.  Apply Xeroform gauze (Lawson [id number]) to open wound beds and dark discolored areas. Cover with ABD pads and wrap with Kerlix roll gauze beginning right above toes and ending right below knee.   If patient can not tolerate any wrapping may secure Xeroform with silicone foam.   Patient is pending assessment by vascular surgeon.  Any wound care orders placed by vascular supercede wound care orders placed by [facility] RN.    POC discussed with bedside nurse. WOC team will not follow. Re-consult if further needs arise.   Thank you,    Powell Bar MSN, RN-BC, [employer]

## 2023-11-17 NOTE — Progress Notes (Signed)
 Pt is unable to move very well and needs assistance of 2 to move from the [facility] to bed. Pt lives at home and has had 2 falls recently. Several family members call pt regularly and when she answers, they know she is ok. When she did not answer the phone for several hrs, they knew something must be wrong and went to ck on her and found her in the bathroom post fall.  She is High Fall Risk.  PT eval placed.

## 2023-11-17 NOTE — Hospital Course (Signed)
 63 y.o. F with DM, obesity class 1, HTN, chronic leg pain, hx gastric bypass, who presented with leg pain, redness and swelling found to have cellulitis.

## 2023-11-17 NOTE — Plan of Care (Signed)
  Problem: Coping: Goal: Ability to adjust to condition or change in health will improve 11/17/2023 0705 by Louvella Greig BROCKS, RN Outcome: Progressing 11/17/2023 0702 by Louvella Greig BROCKS, RN Outcome: Progressing   Problem: Skin Integrity: Goal: Risk for impaired skin integrity will decrease 11/17/2023 0705 by Louvella Greig BROCKS, RN Outcome: Progressing 11/17/2023 0702 by Louvella Greig BROCKS, RN Outcome: Progressing

## 2023-11-17 NOTE — Assessment & Plan Note (Signed)
 Presented with SIRS criteria, infection and AKI (Cr >2).   - Continue antibiotics, narrow to cefazolin 

## 2023-11-17 NOTE — Assessment & Plan Note (Signed)
 Cr 2.7 down to 2.1 today.  Baseline is 1.1 - Continue IV fluids

## 2023-11-17 NOTE — Plan of Care (Signed)
  Problem: Tissue Perfusion: Goal: Adequacy of tissue perfusion will improve Outcome: Progressing   Problem: Skin Integrity: Goal: Risk for impaired skin integrity will decrease Outcome: Progressing

## 2023-11-17 NOTE — Progress Notes (Signed)
  Progress Note   Patient: Lacey Meyer FMW:[id number] DOB: [birth year] DOA: 11/16/2023     1 DOS: the patient was seen and examined on 11/17/2023 at 10:00AM      Brief hospital course: 63 y.o. F with DM, obesity class 1, HTN, chronic leg pain, hx gastric bypass, who presented with leg pain, redness and swelling found to have cellulitis.     Assessment and Plan: * Sepsis (HCC) Presented with SIRS criteria, infection and AKI (Cr >2).   - Continue antibiotics, narrow to cefazolin   AKI (acute kidney injury) (HCC) Cr 2.7 down to 2.1 today.  Baseline is 1.1 - Continue IV fluids  Type 2 diabetes mellitus with obesity (HCC) Glucose low - Hold 70/30 - Continue SS corrections          Subjective: Patient is gradually feeling better.  She has had no confusion, fever, vomiting, diarrhea.     Physical Exam: BP (!) 119/54 (BP Location: Left Arm)   Pulse 78   Temp 98.3 F (36.8 C) (Oral)   Resp 15   Ht 5' 3 (1.6 m)   Wt 80.2 kg   SpO2 100%   BMI 31.32 kg/m   Elderly adult female, lying in bed, interactive and appropriate RRR, no murmurs, no peripheral edema Respiratory rate normal, lungs clear without rales or wheezes Abdomen soft no tenderness palpation or guarding, no ascites or distention The right leg is Chronic venous stasis change, the left leg is Worse change, significant cracking of the skin, and severe pain and redness and edema Attention normal, affect normal, judgment and insight appear normal    Data Reviewed: Basic metabolic panel shows creatinine down to 2.1, BUN 39, bicarb 17 CBC shows anemia, thrombocytopenia    Family Communication: Son at the bedside    Disposition: Status is: Inpatient         Author: Lonni SHAUNNA Dalton, MD 11/17/2023 5:07 PM  For on call review [email or web address].

## 2023-11-18 ENCOUNTER — Inpatient Hospital Stay (HOSPITAL_COMMUNITY)

## 2023-11-18 DIAGNOSIS — A419 Sepsis, unspecified organism: Secondary | ICD-10-CM | POA: Diagnosis not present

## 2023-11-18 LAB — PROTEIN / CREATININE RATIO, URINE
Creatinine, Urine: 100 mg/dL
Protein Creatinine Ratio: 0.19 mg/mg{creat} — ABNORMAL HIGH (ref 0.00–0.15)
Total Protein, Urine: 19 mg/dL

## 2023-11-18 LAB — GLUCOSE, CAPILLARY
Glucose-Capillary: 104 mg/dL — ABNORMAL HIGH (ref 70–99)
Glucose-Capillary: 85 mg/dL (ref 70–99)
Glucose-Capillary: 126 mg/dL — ABNORMAL HIGH (ref 70–99)
Glucose-Capillary: 151 mg/dL — ABNORMAL HIGH (ref 70–99)
Glucose-Capillary: 183 mg/dL — ABNORMAL HIGH (ref 70–99)

## 2023-11-18 LAB — BASIC METABOLIC PANEL WITH GFR
Anion gap: 13 (ref 5–15)
BUN: 36 mg/dL — ABNORMAL HIGH (ref 8–23)
CO2: 18 mmol/L — ABNORMAL LOW (ref 22–32)
Calcium: 8.6 mg/dL — ABNORMAL LOW (ref 8.9–10.3)
Chloride: 105 mmol/L (ref 98–111)
Creatinine, Ser: 2.22 mg/dL — ABNORMAL HIGH (ref 0.44–1.00)
GFR, Estimated: 24 mL/min — ABNORMAL LOW (ref >60)
Glucose, Bld: 136 mg/dL — ABNORMAL HIGH (ref 70–99)
Potassium: 5.3 mmol/L — ABNORMAL HIGH (ref 3.5–5.1)
Sodium: 136 mmol/L (ref 135–145)

## 2023-11-18 LAB — CBC
HCT: 28.2 % — ABNORMAL LOW (ref 36.0–46.0)
Hemoglobin: 9.2 g/dL — ABNORMAL LOW (ref 12.0–15.0)
MCH: 28 pg (ref 26.0–34.0)
MCHC: 32.6 g/dL (ref 30.0–36.0)
MCV: 85.7 fL (ref 80.0–100.0)
Platelets: 103 K/uL — ABNORMAL LOW (ref 150–400)
RBC: 3.29 MIL/uL — ABNORMAL LOW (ref 3.87–5.11)
RDW: 15.6 % — ABNORMAL HIGH (ref 11.5–15.5)
WBC: 6.7 K/uL (ref 4.0–10.5)
nRBC: 0 % (ref 0.0–0.2)

## 2023-11-18 LAB — SODIUM, URINE, RANDOM: Sodium, Ur: 88 mmol/L

## 2023-11-18 LAB — CREATININE, URINE, RANDOM: Creatinine, Urine: 103 mg/dL

## 2023-11-18 MED ORDER — DEXTROSE 5 % IV SOLN: INTRAVENOUS | Status: DC

## 2023-11-18 NOTE — Evaluation (Signed)
 Occupational Therapy Evaluation Patient Details Name: Lacey Meyer MRN: [id number] DOB: 1960/05/26 Today's Date: 11/18/2023   History of Present Illness   Pt is 63 yo female who presents on 11/16/23 after fall at home with hit to posterior head with + LOC. Pt also with LLE pain and was on floor several hours. Pt with SIRS, AKI, new confusion, concern for sepsis from cellulitis LLE. PMH: DM, HTN, arthritis, gout, chronic visual changes, gastric bypass     Clinical Impressions Pt ind at baseline with ADLs and uses cane for mobility, lives with spouse. Pt currently close to baseline, needing up to min A for ADLs, and supervision for transfers with RW. Pt reports RW can fit in all areas of her home, discussed use of 3in1 as shower chair for home if unable to obtain tub bench. Pt presenting with impairments listed below, will follow acutely. Recommend HHOT at d/c.      If plan is discharge home, recommend the following:   A little help with walking and/or transfers;A little help with bathing/dressing/bathroom;Assistance with cooking/housework;Assist for transportation;Help with stairs or ramp for entrance     Functional Status Assessment   Patient has had a recent decline in their functional status and demonstrates the ability to make significant improvements in function in a reasonable and predictable amount of time.     Equipment Recommendations   Tub/shower bench     Recommendations for Other Services   PT consult     Precautions/Restrictions   Precautions Precautions: Fall Precaution/Restrictions Comments: legally blind Restrictions Weight Bearing Restrictions Per Provider Order: No     Mobility Bed Mobility               General bed mobility comments: in chair upon arrival and departure    Transfers Overall transfer level: Needs assistance Equipment used: Rolling walker (2 wheels) Transfers: Sit to/from Stand Sit to Stand: Supervision                   Balance Overall balance assessment: History of Falls, Needs assistance Sitting-balance support: Feet supported, No upper extremity supported Sitting balance-Leahy Scale: Good     Standing balance support: No upper extremity supported, During functional activity Standing balance-Leahy Scale: Fair                             ADL either performed or assessed with clinical judgement   ADL Overall ADL's : Needs assistance/impaired Eating/Feeding: Supervision/ safety   Grooming: Wash/dry hands;Standing;Supervision/safety   Upper Body Bathing: Supervision/ safety;Sitting   Lower Body Bathing: Minimal assistance   Upper Body Dressing : Supervision/safety   Lower Body Dressing: Minimal assistance   Toilet Transfer: Supervision/safety;Ambulation;Rolling walker (2 wheels)   Toileting- Clothing Manipulation and Hygiene: Supervision/safety       Functional mobility during ADLs: Supervision/safety;Rolling walker (2 wheels)       Vision   Additional Comments: legally blind at baseline, pt reports using magnifying glass to see     Perception Perception: Not tested       Praxis Praxis: Not tested       Pertinent Vitals/Pain Pain Assessment Pain Assessment: No/denies pain     Extremity/Trunk Assessment Upper Extremity Assessment Upper Extremity Assessment: Overall WFL for tasks assessed   Lower Extremity Assessment Lower Extremity Assessment: Defer to PT evaluation   Cervical / Trunk Assessment Cervical / Trunk Assessment: Other exceptions Cervical / Trunk Exceptions: increased body habitus, increased hip width  Communication Communication Communication: No apparent difficulties   Cognition Arousal: Alert Behavior During Therapy: WFL for tasks assessed/performed Cognition: No apparent impairments                               Following commands: Intact       Cueing  General Comments   Cueing Techniques: Verbal cues  VSS,  son present   Exercises     Shoulder Instructions      Home Living Family/patient expects to be discharged to:: Private residence Living Arrangements: Spouse/significant other Available Help at Discharge: Family;Available PRN/intermittently Type of Home: House Home Access: Level entry     Home Layout: One level     Bathroom Shower/Tub: Chief Strategy Officer: Standard     Home Equipment: Cane - single point;Toilet riser;Grab bars - tub/shower   Additional Comments: Lives with husband, has other family support as well. Husband works out of the home a few days/wk. Husband to order handheld shower head and tub transfer bench      Prior Functioning/Environment Prior Level of Function : Independent/Modified Independent             Mobility Comments: uses SPC, has had 3 falls in past 3 mos ADLs Comments: independent, but fell when she was about to get in the shower    OT Problem List: Decreased strength;Decreased activity tolerance;Decreased range of motion;Impaired balance (sitting and/or standing)   OT Treatment/Interventions: Self-care/ADL training;Therapeutic exercise;Energy conservation;DME and/or AE instruction;Therapeutic activities;Balance training;Patient/family education      OT Goals(Current goals can be found in the care plan section)   Acute Rehab OT Goals Patient Stated Goal: none stated OT Goal Formulation: With patient Time For Goal Achievement: 12/02/23 Potential to Achieve Goals: Good ADL Goals Pt Will Perform Upper Body Dressing: Independently;standing;sitting Pt Will Perform Lower Body Dressing: Independently;sitting/lateral leans;sit to/from stand Pt Will Transfer to Toilet: Independently;ambulating;regular height toilet Pt Will Perform Tub/Shower Transfer: Tub transfer;Shower transfer;Independently;ambulating Additional ADL Goal #1: Pt will verbalize x3 fall prevention strategies for safety with ADLs   OT Frequency:  Min 2X/week     Co-evaluation              AM-PAC OT 6 Clicks Daily Activity     Outcome Measure Help from another person eating meals?: A Little Help from another person taking care of personal grooming?: A Little Help from another person toileting, which includes using toliet, bedpan, or urinal?: A Little Help from another person bathing (including washing, rinsing, drying)?: A Little Help from another person to put on and taking off regular upper body clothing?: A Little Help from another person to put on and taking off regular lower body clothing?: A Little 6 Click Score: 18   End of Session Equipment Utilized During Treatment: Rolling walker (2 wheels) Nurse Communication: Mobility status;Other (comment) (urinated in hat)  Activity Tolerance: Patient tolerated treatment well Patient left: in chair;with call bell/phone within reach;with family/visitor present  OT Visit Diagnosis: Unsteadiness on feet (R26.81);Other abnormalities of gait and mobility (R26.89);Muscle weakness (generalized) (M62.81)                Time: 8443-8377 OT Time Calculation (min): 26 min Charges:  OT General Charges $OT Visit: 1 Visit OT Evaluation $OT Eval Low Complexity: 1 Low OT Treatments $Self Care/Home Management : 8-22 mins  Bronislaw Switzer K, OTD, OTR/L SecureChat Preferred Acute Rehab [phone]   Laneta POUR Koonce 11/18/2023, 5:09  PM

## 2023-11-18 NOTE — Evaluation (Signed)
 Physical Therapy Evaluation Patient Details Name: Lacey Meyer MRN: [id number] DOB: September 08, 1960 Today's Date: 11/18/2023  History of Present Illness  Pt is 63 yo female who presents on 11/16/23 after fall at home with hit to posterior head with + LOC. Pt also with LLE pain and was on floor several hours. Pt with SIRS, AKI, new confusion, concern for sepsis from cellulitis LLE. PMH: DM, HTN, arthritis, gout, chronic visual changes, gastric bypass  Clinical Impression  Pt admitted with above diagnosis. Pt from home with spouse and is sometimes at home alone as she was in this case. Pt has had 3 falls in the past 3 mos. Contributors to this include that pt is legally blind and she has neuropathy and swelling in BLE's. Pt ambulated >100' with RW and supervision, recommend RW for home as well as 3-in-1 for use at night. Also recommend HHPT for balance program. Pt currently with functional limitations due to the deficits listed below (see PT Problem List). Pt will benefit from acute skilled PT to increase their independence and safety with mobility to allow discharge.           If plan is discharge home, recommend the following: A lot of help with walking and/or transfers;Assistance with cooking/housework;Assist for transportation   Can travel by private vehicle        Equipment Recommendations Rolling walker (2 wheels);BSC/3in1;Other (comment) (would benefit from bariatric size due to hip width)  Recommendations for Other Services       Functional Status Assessment Patient has had a recent decline in their functional status and demonstrates the ability to make significant improvements in function in a reasonable and predictable amount of time.     Precautions / Restrictions Precautions Precautions: Fall Recall of Precautions/Restrictions: Intact Precaution/Restrictions Comments: legally blind Restrictions Weight Bearing Restrictions Per Provider Order: No      Mobility  Bed Mobility                General bed mobility comments: pt received up in recliner    Transfers Overall transfer level: Needs assistance Equipment used: Rolling walker (2 wheels) Transfers: Sit to/from Stand Sit to Stand: Supervision           General transfer comment: vc's for hand placement as pt is used to [facility] and not RW. No physical assist needed to come to standing    Ambulation/Gait Ambulation/Gait assistance: Supervision Gait Distance (Feet): 100 Feet Assistive device: Rolling walker (2 wheels) Gait Pattern/deviations: Step-through pattern Gait velocity: decreased Gait velocity interpretation: 1.31 - 2.62 ft/sec, indicative of limited community ambulator   General Gait Details: pt steady with use of RW  Stairs            Wheelchair Mobility     Tilt Bed    Modified Rankin (Stroke Patients Only)       Balance Overall balance assessment: History of Falls, Needs assistance Sitting-balance support: Feet supported, No upper extremity supported Sitting balance-Leahy Scale: Good     Standing balance support: No upper extremity supported, During functional activity Standing balance-Leahy Scale: Fair Standing balance comment: able to maintain static standing without UE support but unsteady without UE support with dynamic balance Single Leg Stance - Right Leg: 0 Single Leg Stance - Left Leg: 0                         Pertinent Vitals/Pain Pain Assessment Pain Assessment: No/denies pain    Home Living Family/patient expects to  be discharged to:: Private residence Living Arrangements: Spouse/significant other Available Help at Discharge: Family;Available PRN/intermittently Type of Home: House Home Access: Level entry       Home Layout: One level Home Equipment: Cane - single point;Toilet riser;Grab bars - tub/shower Additional Comments: Lives with husband, has other family support as well. Husband works out of the home a few days/wk. Husband to  order handheld shower head and tub transfer bench    Prior Function Prior Level of Function : Independent/Modified Independent             Mobility Comments: uses SPC, has had 3 falls in past 3 mos ADLs Comments: independent, but fell when she was about to get in the shower     Extremity/Trunk Assessment   Upper Extremity Assessment Upper Extremity Assessment: Overall WFL for tasks assessed    Lower Extremity Assessment Lower Extremity Assessment:  (BLE swelling and peripheral neuorpathy)    Cervical / Trunk Assessment Cervical / Trunk Assessment: Other exceptions Cervical / Trunk Exceptions: increased body habitus, increased hip width  Communication   Communication Communication: No apparent difficulties    Cognition Arousal: Alert Behavior During Therapy: WFL for tasks assessed/performed   PT - Cognitive impairments: Problem solving                       PT - Cognition Comments: AxOx4, mildly slowed problem solving Following commands: Intact       Cueing Cueing Techniques: Verbal cues     General Comments General comments (skin integrity, edema, etc.): VSS. Husband and grandson present. Discussed consistent shoewear with pt's neuropathy as well as consistent use of RW right now due to need to prevent further hit to the head. Pt also educated on components of balance and that she has deficits to both vision and somatosensation so she needs to really work on vestibular.    Exercises     Assessment/Plan    PT Assessment Patient needs continued PT services  PT Problem List Impaired sensation;Decreased balance;Decreased knowledge of use of DME       PT Treatment Interventions DME instruction;Gait training;Functional mobility training;Therapeutic activities;Therapeutic exercise;Balance training;Patient/family education    PT Goals (Current goals can be found in the Care Plan section)  Acute Rehab PT Goals Patient Stated Goal: return home PT Goal  Formulation: With patient/family Time For Goal Achievement: 12/02/23 Potential to Achieve Goals: Good    Frequency Min 2X/week     Co-evaluation               AM-PAC PT 6 Clicks Mobility  Outcome Measure Help needed turning from your back to your side while in a flat bed without using bedrails?: None Help needed moving from lying on your back to sitting on the side of a flat bed without using bedrails?: A Little Help needed moving to and from a bed to a chair (including a wheelchair)?: A Little Help needed standing up from a chair using your arms (e.g., wheelchair or bedside chair)?: A Little Help needed to walk in hospital room?: A Little Help needed climbing 3-5 steps with a railing? : A Lot 6 Click Score: 18    End of Session Equipment Utilized During Treatment: Gait belt Activity Tolerance: Patient tolerated treatment well Patient left: in chair;with call bell/phone within reach;with family/visitor present Nurse Communication: Mobility status PT Visit Diagnosis: Unsteadiness on feet (R26.81);Repeated falls (R29.6)    Time: 8790-8763 PT Time Calculation (min) (ACUTE ONLY): 27 min   Charges:  PT Evaluation $PT Eval Moderate Complexity: 1 Mod PT Treatments $Gait Training: 8-22 mins PT General Charges $$ ACUTE PT VISIT: 1 Visit         Richerd Lipoma, PT  Acute Rehab Services Secure chat preferred Office [phone]   Richerd L Kyley Solow 11/18/2023, 1:30 PM

## 2023-11-18 NOTE — Progress Notes (Signed)
 Pt reported to NT she felt she could not urinate in the bedpan or w pure wick. Pt was determined she could get to bedside commode without much assistance. Pt was able to get to EOB independently, sit for a few mins, min assist to stand. Using the RW was able to pivot/turn and sit on BSC. Pt urinated and had BM the first time to [facility]. Pt was able to get back to bed w 1 person. Pt can move her legs onto bed independently.  Pt required cues to maneuver safely and x1 mod assist during toileting

## 2023-11-18 NOTE — TOC CM/SW Note (Signed)
    Durable Medical Equipment  (From admission, onward)           Start     Ordered   11/18/23 1341  For home use only DME 3 n 1  Once       Comments: would benefit from bariatric size due to hip width)   11/18/23 1341   11/18/23 1332  For home use only DME Walker rolling  Once       Question Answer Comment  Walker: With 5 Inch Wheels   Patient needs a walker to treat with the following condition Weakness      11/18/23 1331           Patient confined to a room with no bathroom, therefore a 3 in 1 / bedside commode is needed

## 2023-11-18 NOTE — Consult Note (Addendum)
 WOC Nurse Consult Note: Consult requested for left leg wound. This was already performed on 9/7; please refer to previous consult note for assessment and plan of care, and topical treatment orders have been provided for bedside nurses to perform.  Please re-consult if further assistance is needed.  Thank-you,  Stephane Fought MSN, RN, CWOCN, CWCN-AP, CNS Contact Mon-Fri 0700-1500: [phone]

## 2023-11-18 NOTE — Progress Notes (Addendum)
  Progress Note   Patient: Lacey Meyer FMW:[id number] DOB: [birth year] DOA: 11/16/2023     2 DOS: the patient was seen and examined on 11/18/2023 at 10:14AM      Brief hospital course: 63 y.o. F with DM, obesity class 1, HTN, chronic leg pain, hx gastric bypass, who presented with leg pain, redness and swelling found to have cellulitis.     Assessment and Plan: * Sepsis (HCC) Presented with SIRS criteria, infection and AKI (Cr >2), hypoglycemia, thrombocytopenia.   - Continue antibiotics, narrow to cefazolin   AKI (acute kidney injury) (HCC) Hyperkalemia Acute metabolic acidosis Cr 2.7 down to 2.1, no change today.  Discussed with her PCP by phone today, baseline creatinine is 1.6 last March. - Continue IV fluids - Trend BMP and Cr  - Avoid nephrotoxins - Renal US  - Urine studies  Type 2 diabetes mellitus with obesity (HCC) Glucose has been low several times. - Hold 70/30 - Continue SS correction insulin  low dose  Venous insufficiency  - Consult WOC  Class 1 obesity BMI 31, complicates cares.  Normocytic anemia No clinical bleeding, stable  Thrombocytopenia Mild, stable       Subjective: Patient is feeling okay.  She is eating all right, she is up and able to transfer with assistance with PT.      Physical Exam: BP (!) 155/81 (BP Location: Right Arm)   Pulse 76   Temp 98.3 F (36.8 C) (Oral)   Resp 18   Ht 5' 3 (1.6 m)   Wt 80.2 kg   SpO2 95%   BMI 31.32 kg/m   Adult female, lying in bed, interactive and appropriate RRR, no murmurs, she has edema in the left leg and chronic venous stasis changes to both lower extremities Respiratory rate normal, lungs clear, no rales or wheezing Abdomen soft, no tenderness palpation or guarding Attention normal, affect normal, judgment and insight appear normal, face symmetric, moves upper extremities with normal strength and coordination   Data Reviewed: Discussed with the patient's PCP by phone Basic  metabolic panel shows hyperkalemia, mild Bolick acidosis, stable renal function CBC shows stable anemia, thrombocytopenia      Family Communication:     Disposition: Status is: Inpatient         Author: Lonni SHAUNNA Dalton, MD 11/18/2023 2:00 PM  For on call review [email or web address].

## 2023-11-18 NOTE — Plan of Care (Signed)

## 2023-11-18 NOTE — TOC Initial Note (Addendum)
 Transition of Care (TOC) - Initial/Assessment Note   Spoke to patient and family at bedside. Patient from home alone , however has family checking on her.   PT recommending HHPT, rolling walker and 3 in 1 . Patient in agreement. Patient no preference.   Explained can ask for [facility] to check wound, however, patient and family will be taught how to do wound care prior to discharge, because insurance will not cover daily [facility] visits. Patient and family voiced understanding.   Kelly with [facility] accepted for HHPT,OT,RN.   Ordered rolling walker and 3 in 1 with Zachary with [facility].   Secure chatted MD to sign orders and co sign DME note   Patient Details  Name: Lacey Meyer MRN: [id number] Date of Birth: January 30, 1961  Transition of Care [facility]) CM/SW Contact:    Stephane Powell Jansky, RN Phone Number: 11/18/2023, 1:33 PM  Clinical Narrative:                   Expected Discharge Plan: Home w Home Health Services Barriers to Discharge: Continued Medical Work up   Patient Goals and CMS Choice Patient states their goals for this hospitalization and ongoing recovery are:: to return to home CMS Medicare.gov Compare Post Acute Care list provided to:: Patient Choice offered to / list presented to : Patient      Expected Discharge Plan and Services   Discharge Planning Services: CM Consult Post Acute Care Choice: Home Health, Durable Medical Equipment Living arrangements for the past 2 months: Single Family Home                 DME Arranged: 3-N-1, Walker rolling         HH Arranged: RN, PT          Prior Living Arrangements/Services Living arrangements for the past 2 months: Single Family Home Lives with:: Self Patient language and need for interpreter reviewed:: Yes Do you feel safe going back to the place where you live?: Yes      Need for Family Participation in Patient Care: Yes (Comment) Care giver support system in place?: Yes (comment)   Criminal  Activity/Legal Involvement Pertinent to Current Situation/Hospitalization: No - Comment as needed  Activities of Daily Living   ADL Screening (condition at time of admission) Independently performs ADLs?: No Is the patient deaf or have difficulty hearing?: No Does the patient have difficulty seeing, even when wearing glasses/contacts?: Yes Does the patient have difficulty concentrating, remembering, or making decisions?: No  Permission Sought/Granted   Permission granted to share information with : Yes, Verbal Permission Granted     Permission granted to share info w AGENCY: DME agencies, hom ehealth agencies        Emotional Assessment Appearance:: Appears stated age Attitude/Demeanor/Rapport: Engaged Affect (typically observed): Appropriate Orientation: : Oriented to Self, Oriented to Place, Oriented to  Time, Oriented to Situation Alcohol / Substance Use: Not Applicable Psych Involvement: No (comment)  Admission diagnosis:  Sepsis (HCC) [A41.9] Patient Active Problem List   Diagnosis Date Noted   AKI (acute kidney injury) (HCC) 11/16/2023   Body mass index (BMI) 45.0-49.9, adult (HCC) 11/16/2023   Posterior vitreous detachment, left eye 05/04/2020   Right posterior capsular opacification 07/28/2019   Branch retinal vein occlusion with macular edema of right eye 06/16/2019   Vitreomacular adhesion of right eye 06/16/2019   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 associated with type 2 diabetes mellitus (HCC) 06/16/2019   Diabetic neuropathy  associated with type 2 diabetes mellitus (HCC) 01/07/2019   Legally blind 01/07/2019   Cellulitis and abscess of foot, except toes 12/11/2018   Pain due to onychomycosis of toenails of both feet 08/27/2018   Sensorineural hearing loss (SNHL) of both ears 11/25/2017   Deviated septum 09/23/2017   ETD (Eustachian tube dysfunction), bilateral 09/23/2017   Nasal turbinate hypertrophy 09/23/2017   OM  (otitis media), recurrent, bilateral 09/23/2017   Rhinitis, chronic 09/23/2017   Community acquired pneumonia of left lower lobe of lung    Lactic acidosis    Left lower lobe pneumonia 06/16/2016   Sepsis (HCC) 06/16/2016   Acute encephalopathy 03/19/2016   Type 2 diabetes mellitus with obesity (HCC) 03/19/2016   Altered mental status 03/18/2016   Morbid obesity (HCC) 01/03/2016   Retinal hemorrhage 02/28/2013   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   Pain in limb 12/23/2012   Arthritis of foot 12/23/2012   Obesity, Class II, BMI 35-39.9, with comorbidity 06/25/2011   HTN (hypertension)    Impaired vision    Gout    Anemia    Pseudophakia 02/20/2011   Central loss of vision 02/20/2011   Acute loss of vision 12/15/2010   PCP:  Shelda Atlas, MD Pharmacy:   [facility] [phone] - [place] - [address] AT [facility] OF [address] [place] [address] Phone: [phone] Fax: [phone]     Social Drivers of Health (SDOH) Social History: SDOH Screenings   Food Insecurity: No Food Insecurity (11/17/2023)  Housing: Low Risk  (11/17/2023)  Transportation Needs: No Transportation Needs (11/17/2023)  Utilities: Not At Risk (11/17/2023)  Social Connections: Socially Integrated (11/17/2023)  Tobacco Use: Medium Risk (11/16/2023)   SDOH Interventions:     Readmission Risk Interventions     No data to display

## 2023-11-19 ENCOUNTER — Other Ambulatory Visit: Payer: Self-pay

## 2023-11-19 DIAGNOSIS — A419 Sepsis, unspecified organism: Secondary | ICD-10-CM | POA: Diagnosis not present

## 2023-11-19 LAB — COMPREHENSIVE METABOLIC PANEL WITH GFR
ALT: 21 U/L (ref 0–44)
AST: 42 U/L — ABNORMAL HIGH (ref 15–41)
Albumin: 2.6 g/dL — ABNORMAL LOW (ref 3.5–5.0)
Alkaline Phosphatase: 48 U/L (ref 38–126)
Anion gap: 14 (ref 5–15)
BUN: 33 mg/dL — ABNORMAL HIGH (ref 8–23)
CO2: 18 mmol/L — ABNORMAL LOW (ref 22–32)
Calcium: 8.7 mg/dL — ABNORMAL LOW (ref 8.9–10.3)
Chloride: 103 mmol/L (ref 98–111)
Creatinine, Ser: 1.91 mg/dL — ABNORMAL HIGH (ref 0.44–1.00)
GFR, Estimated: 29 mL/min — ABNORMAL LOW (ref >60)
Glucose, Bld: 102 mg/dL — ABNORMAL HIGH (ref 70–99)
Potassium: 4.8 mmol/L (ref 3.5–5.1)
Sodium: 135 mmol/L (ref 135–145)
Total Bilirubin: 0.7 mg/dL (ref 0.0–1.2)
Total Protein: 7.5 g/dL (ref 6.5–8.1)

## 2023-11-19 LAB — CBC
HCT: 28.5 % — ABNORMAL LOW (ref 36.0–46.0)
Hemoglobin: 9.2 g/dL — ABNORMAL LOW (ref 12.0–15.0)
MCH: 27.8 pg (ref 26.0–34.0)
MCHC: 32.3 g/dL (ref 30.0–36.0)
MCV: 86.1 fL (ref 80.0–100.0)
Platelets: 109 K/uL — ABNORMAL LOW (ref 150–400)
RBC: 3.31 MIL/uL — ABNORMAL LOW (ref 3.87–5.11)
RDW: 15.6 % — ABNORMAL HIGH (ref 11.5–15.5)
WBC: 6.9 K/uL (ref 4.0–10.5)
nRBC: 0.3 % — ABNORMAL HIGH (ref 0.0–0.2)

## 2023-11-19 LAB — GLUCOSE, CAPILLARY
Glucose-Capillary: 75 mg/dL (ref 70–99)
Glucose-Capillary: 157 mg/dL — ABNORMAL HIGH (ref 70–99)
Glucose-Capillary: 114 mg/dL — ABNORMAL HIGH (ref 70–99)
Glucose-Capillary: 131 mg/dL — ABNORMAL HIGH (ref 70–99)

## 2023-11-19 MED ORDER — PREGABALIN 75 MG PO CAPS
150.0000 mg | ORAL_CAPSULE | Freq: Every day | ORAL | Status: DC
Start: 2023-11-20 — End: 2023-11-20
  Administered 2023-11-20: 150 mg via ORAL
  Filled 2023-11-19: qty 2

## 2023-11-19 MED ORDER — CEFADROXIL 500 MG PO CAPS
1000.0000 mg | ORAL_CAPSULE | Freq: Once | ORAL | Status: AC
  Administered 2023-11-19: 1000 mg via ORAL
  Filled 2023-11-19: qty 2

## 2023-11-19 MED ORDER — CEFADROXIL 500 MG PO CAPS
500.0000 mg | ORAL_CAPSULE | Freq: Two times a day (BID) | ORAL | Status: DC
  Administered 2023-11-19 – 2023-11-20 (×2): 500 mg via ORAL
  Filled 2023-11-19 (×2): qty 1

## 2023-11-19 MED ORDER — DEXTROSE 5 % IV SOLN: INTRAVENOUS | Status: DC

## 2023-11-19 NOTE — Progress Notes (Signed)
  Progress Note   Patient: Lacey Meyer FMW:[id number] DOB: 12-Jun-1960 DOA: 11/16/2023     3 DOS: the patient was seen and examined on 11/19/2023 at 9:05AM      Brief hospital course: 63 y.o. F with DM, obesity class 1, HTN, chronic leg pain, hx gastric bypass, who presented with leg pain, redness and swelling found to have cellulitis, AKI and sepsis.     Assessment and Plan: * Sepsis (HCC) Presented with tachycardia, leukocytosis, AKI, hyperglycemia and thrombocytopenia.  Found to have infection in the left leg. - Continue antibiotics, day 4 --> switch to cefadroxil  today  AKI (acute kidney injury) (HCC) Hyperkalemia Acute metabolic acidosis Admitted with creatinine 2.7, up from baseline.  Discussed with PCP, baseline last March was 1.6.  Creatinine today down to 1.9 No significant urine protein, renal ultrasound normal.  Fina 1.4%, indeterminate - Hold home benazepril , olmesartan, and furosemide  - Continue IV fluids  Hypertension BP soft here - Continue IV fluids - Hold benazepril , furosemide , metoprolol , and olmesartan - Unclear if she actually takes both benazepril  and olmesartan - Likely will hold these at discharge with follow-up with PCP   Type 2 diabetes mellitus with obesity (HCC) The patient has had persistent hypoglycemia in the hospital. - Continue dextrose  infusion - Hold 70/30 - Hold glimepiride  - Given advancing renal function, likely discontinue 70/30 and glimepiride  at discharge - Takes home Mounjaro which is not on home med list, but reported by daughter; probably this can be cautiously resumed by PCP if patient has no vomiting after discharge (has had none here)  Venous insufficiency - Consult wound care  Normocytic anemia No clinical bleeding, hemoglobin stable at 9.2 - Follow-up with PCP  Thrombocytopenia Mild, stable, from sepsis  Class I obesity BMI 31, complicates cares        Subjective: Feeling somewhat better, appetite is  better, energy seems better, pain is improving gradually, but still difficult to walk.  No fever, no confusion.       Physical Exam: BP (!) 102/51 (BP Location: Right Wrist)   Pulse 74   Temp 98 F (36.7 C)   Resp 17   Ht 5' 3 (1.6 m)   Wt 80.2 kg   SpO2 100%   BMI 31.32 kg/m   Adult female, sitting in recliner, interactive and appropriate RRR, no murmurs, still with edema in the left leg, chronic venous stasis changes to both lower extremities Respiratory rate normal, lungs clear, no rales or wheezing Abdomen soft, no tenderness palpation or guarding, no ascites or distention Attention normal, affect pleasant, judgment and insight were normal, face symmetric, speech fluent    Data Reviewed: Basic metabolic panel shows mild acidosis, improving renal function, elevated BUN to creatinine ratio Fina is 1.4, indeterminate Renal ultrasound unremarkable CBC shows stable anemia and thrombocytopenia   Family Communication: Daughter by phone    Disposition: Status is: Inpatient 63 year old F admitted with sepsis from cellulitis, she is slowly improving.  Given her renal function was previously much better than it is currently, and her blood pressure still soft, I recommend 1 additional day of IV fluids, likely switch to cefadroxil  on discharge tomorrow        Author: Lonni SHAUNNA Dalton, MD 11/19/2023 3:42 PM  For on call review [email or web address].

## 2023-11-19 NOTE — Care Management Important Message (Signed)
 Important Message  Patient Details  Name: Lacey Meyer MRN: [id number] Date of Birth: 07-18-1960   Important Message Given:  Yes - Medicare IM     Jon Cruel 11/19/2023, 4:07 PM

## 2023-11-19 NOTE — Plan of Care (Signed)
   Problem: Coping: Goal: Ability to adjust to condition or change in health will improve Outcome: Progressing   Problem: Skin Integrity: Goal: Risk for impaired skin integrity will decrease Outcome: Progressing

## 2023-11-19 NOTE — Progress Notes (Signed)
 Physical Therapy Treatment Patient Details Name: Lacey Meyer MRN: [id number] DOB: [birth year] Today's Date: 11/19/2023   History of Present Illness Pt is 63 yo female who presents on 11/16/23 after fall at home with hit to posterior head with + LOC. Pt also with LLE pain and was on floor several hours. Pt with SIRS, AKI, new confusion, concern for sepsis from cellulitis LLE. PMH: DM, HTN, arthritis, gout, chronic visual changes, gastric bypass    PT Comments  Pt progressing with activity tolerance and ambulation distance, went 250' with RW and supervision. Worked on standing balance and reviewed exercises that family can do with pt at home. Encouraged consistent footwear with pt's neuropathy and daughter relays that they have new shoes coming. Reviewed how to wrap LE's for compression as daughter reports that compression stockings are too difficult for pt to don. Continue to recommend HHPT upon d/c. PT will continue to follow.     If plan is discharge home, recommend the following: Assistance with cooking/housework;Assist for transportation;A little help with walking and/or transfers   Can travel by private vehicle        Equipment Recommendations  Rolling walker (2 wheels);BSC/3in1;Other (comment) (would benefit from bariatric size due to hip width)    Recommendations for Other Services       Precautions / Restrictions Precautions Precautions: Fall Recall of Precautions/Restrictions: Intact Precaution/Restrictions Comments: legally blind Restrictions Weight Bearing Restrictions Per Provider Order: No     Mobility  Bed Mobility Overal bed mobility: Modified Independent             General bed mobility comments: pt able to come to EOB without assist and return to supine, increased time needed but this was in part due to lethargy    Transfers Overall transfer level: Needs assistance Equipment used: Rolling walker (2 wheels) Transfers: Sit to/from Stand Sit to Stand:  Supervision           General transfer comment: reminder for hand placement. Bari RW present in room to take home so set to correct height for pt    Ambulation/Gait Ambulation/Gait assistance: Supervision Gait Distance (Feet): 250 Feet Assistive device: Rolling walker (2 wheels) Gait Pattern/deviations: Step-through pattern Gait velocity: decreased Gait velocity interpretation: 1.31 - 2.62 ft/sec, indicative of limited community ambulator   General Gait Details: pt steady with use of RW   Stairs             Wheelchair Mobility     Tilt Bed    Modified Rankin (Stroke Patients Only)       Balance Overall balance assessment: History of Falls, Needs assistance Sitting-balance support: Feet supported, No upper extremity supported Sitting balance-Leahy Scale: Good     Standing balance support: No upper extremity supported, During functional activity Standing balance-Leahy Scale: Fair Standing balance comment: worked on static and dynamic  standing balance without UE support and gave family activities they can do at home to continue working on this until [facility] starts                            Communication Communication Communication: No apparent difficulties  Cognition Arousal: Alert, Lethargic Behavior During Therapy: WFL for tasks assessed/performed   PT - Cognitive impairments: Problem solving                       PT - Cognition Comments: AxOx4, mildly slowed problem solving Following commands: Intact  Cueing Cueing Techniques: Verbal cues  Exercises      General Comments General comments (skin integrity, edema, etc.): Discussed consistent shoe wear and daughter just ordered diabetic shoes for pt. Daughter relays that they want to keep using ACE wrap for LE compression instead of compression stockings which are too difficult for pt to don. Reviewed how to correctly wrap with ACE wrap.      Pertinent Vitals/Pain Pain  Assessment Pain Assessment: Faces Faces Pain Scale: Hurts little more Pain Location: B feet (neuropathic pain) Pain Descriptors / Indicators: Shooting Pain Intervention(s): Limited activity within patient's tolerance, Monitored during session    Home Living                          Prior Function            PT Goals (current goals can now be found in the care plan section) Acute Rehab PT Goals Patient Stated Goal: return home PT Goal Formulation: With patient/family Time For Goal Achievement: 12/02/23 Potential to Achieve Goals: Good Progress towards PT goals: Progressing toward goals    Frequency    Min 2X/week      PT Plan      Co-evaluation              AM-PAC PT 6 Clicks Mobility   Outcome Measure  Help needed turning from your back to your side while in a flat bed without using bedrails?: None Help needed moving from lying on your back to sitting on the side of a flat bed without using bedrails?: None Help needed moving to and from a bed to a chair (including a wheelchair)?: A Little Help needed standing up from a chair using your arms (e.g., wheelchair or bedside chair)?: A Little Help needed to walk in hospital room?: A Little Help needed climbing 3-5 steps with a railing? : A Little 6 Click Score: 20    End of Session Equipment Utilized During Treatment: Gait belt Activity Tolerance: Patient tolerated treatment well Patient left: with call bell/phone within reach;with family/visitor present;in bed Nurse Communication: Mobility status PT Visit Diagnosis: Unsteadiness on feet (R26.81);Repeated falls (R29.6)     Time: 8548-8472 PT Time Calculation (min) (ACUTE ONLY): 36 min  Charges:    $Gait Training: 8-22 mins $Therapeutic Activity: 8-22 mins PT General Charges $$ ACUTE PT VISIT: 1 Visit                     Richerd Lipoma, PT  Acute Rehab Services Secure chat preferred Office [phone]    Richerd CROME [facility] 11/19/2023,  4:13 PM

## 2023-11-19 NOTE — TOC Progression Note (Addendum)
 Transition of Care (TOC) - Progression Note   Per Arthea with [employer] insurance did not approve bariatric walker and 3 in 1 due to patient's height and weight. Per PT patient would benefit from bariatric walker and 3 in 1 due to her hip width ( see PT note) . [employer] requesting Per PT patient would benefit from bariatric walker and 3 in 1 due to her hip width be added to narrative for DME and they will submit to insurance again. Same done   Insurance has approved bari walker and 3 in 1. [employer] will change out DME today  Patient Details  Name: PARNIKA TWETEN MRN: [id number] Date of Birth: 28-Feb-1961  Transition of Care [facility]) CM/SW Contact  Uzma Hellmer, Powell Jansky, RN Phone Number: 11/19/2023, 10:01 AM  Clinical Narrative:       Expected Discharge Plan: Home w Home Health Services Barriers to Discharge: Continued Medical Work up               Expected Discharge Plan and Services   Discharge Planning Services: CM Consult Post Acute Care Choice: Home Health, Durable Medical Equipment Living arrangements for the past 2 months: Single Family Home                 DME Arranged: 3-N-1, Walker rolling         HH Arranged: RN, PT           Social Drivers of Health (SDOH) Interventions SDOH Screenings   Food Insecurity: No Food Insecurity (11/17/2023)  Housing: Low Risk  (11/17/2023)  Transportation Needs: No Transportation Needs (11/17/2023)  Utilities: Not At Risk (11/17/2023)  Social Connections: Socially Integrated (11/17/2023)  Tobacco Use: Medium Risk (11/16/2023)    Readmission Risk Interventions     No data to display

## 2023-11-19 NOTE — TOC CM/SW Note (Signed)
    Durable Medical Equipment  (From admission, onward)           Start     Ordered   11/19/23 0944  For home use only DME Walker rolling  Once       Comments: would benefit from bariatric size due to hip width)  Question Answer Comment  Walker: With 5 Inch Wheels   Patient needs a walker to treat with the following condition Weakness      11/19/23 0944   11/18/23 1341  For home use only DME 3 n 1  Once       Comments: would benefit from bariatric size due to hip width)   11/18/23 1341             Per PT , patient needs bariatric rolling walker and bedside commode/ 3 in 1 due to she cannot  fit her hips into the width of the regular size.   Patient needs bedside commode / 3 in1 due to being confined to a room with no bathroom.

## 2023-11-20 ENCOUNTER — Other Ambulatory Visit (HOSPITAL_COMMUNITY): Payer: Self-pay

## 2023-11-20 DIAGNOSIS — A419 Sepsis, unspecified organism: Secondary | ICD-10-CM | POA: Diagnosis not present

## 2023-11-20 LAB — BASIC METABOLIC PANEL WITH GFR
Anion gap: 8 (ref 5–15)
BUN: 29 mg/dL — ABNORMAL HIGH (ref 8–23)
CO2: 21 mmol/L — ABNORMAL LOW (ref 22–32)
Calcium: 9 mg/dL (ref 8.9–10.3)
Chloride: 107 mmol/L (ref 98–111)
Creatinine, Ser: 1.74 mg/dL — ABNORMAL HIGH (ref 0.44–1.00)
GFR, Estimated: 32 mL/min — ABNORMAL LOW (ref >60)
Glucose, Bld: 136 mg/dL — ABNORMAL HIGH (ref 70–99)
Potassium: 4.6 mmol/L (ref 3.5–5.1)
Sodium: 136 mmol/L (ref 135–145)

## 2023-11-20 LAB — CBC
HCT: 29.8 % — ABNORMAL LOW (ref 36.0–46.0)
Hemoglobin: 10 g/dL — ABNORMAL LOW (ref 12.0–15.0)
MCH: 28.7 pg (ref 26.0–34.0)
MCHC: 33.6 g/dL (ref 30.0–36.0)
MCV: 85.6 fL (ref 80.0–100.0)
Platelets: 118 K/uL — ABNORMAL LOW (ref 150–400)
RBC: 3.48 MIL/uL — ABNORMAL LOW (ref 3.87–5.11)
RDW: 15.3 % (ref 11.5–15.5)
WBC: 5.8 K/uL (ref 4.0–10.5)
nRBC: 0 % (ref 0.0–0.2)

## 2023-11-20 LAB — GLUCOSE, CAPILLARY: Glucose-Capillary: 114 mg/dL — ABNORMAL HIGH (ref 70–99)

## 2023-11-20 MED ORDER — CEFADROXIL 500 MG PO CAPS
500.0000 mg | ORAL_CAPSULE | Freq: Two times a day (BID) | ORAL | 0 refills | Status: AC
  Filled 2023-11-20: qty 12, 6d supply, fill #0

## 2023-11-20 NOTE — Discharge Summary (Signed)
 Physician Discharge Summary  Lacey Meyer FMW:[id number] DOB: 07-14-1960 DOA: 11/16/2023  PCP: Shelda Atlas, MD  Admit date: 11/16/2023 Discharge date: 11/20/2023  Admitted From: home Disposition:  home  Recommendations for Outpatient Follow-up:  Follow up with PCP in 1-2 weeks  Home Health: PT Equipment/Devices: none  Discharge Condition: stable CODE STATUS: Full code Diet Orders (From admission, onward)     Start     Ordered   11/16/23 1917  Diet Carb Modified Fluid consistency: Thin; Room service appropriate? Yes  Diet effective now       Question Answer Comment  Diet-HS Snack? Nothing   Calorie Level Medium 1600-2000   Fluid consistency: Thin   Room service appropriate? Yes      11/16/23 1917            Brief hospital course: 63 y.o. F with DM, obesity class 1, HTN, chronic leg pain, hx gastric bypass, who presented with leg pain, redness and swelling found to have cellulitis, AKI and sepsis.  Hospital Course / Discharge diagnoses: Sepsis  - Presented with tachycardia, leukocytosis, AKI, hyperglycemia and thrombocytopenia.  Found to have infection in the left leg.  Initially placed on IV antibiotics, improving, will be transition to cefadroxil  and discharged home in stable condition AKI (acute kidney injury) (HCC) on CKD 3A Hyperkalemia Acute metabolic acidosis - Admitted with creatinine 2.7, up from baseline.  Discussed with PCP, baseline last March was 1.6.  Creatinine now returned close to baseline at 1.7.  She has good p.o. intake. No significant urine protein, renal ultrasound normal.  Fina 1.4%, indeterminate Hypertension - BP soft initially, but now becoming increasingly hypertensive.  Resume home medications but continue to hold benazepril  due to her AKI.  Recommend PCP follow-up with repeat blood work in a week Type 2 diabetes mellitus with obesity (HCC) -continue home medications on discharge, but hold glimepiride  due to her chronic kidney  disease Normocytic anemia - No clinical bleeding, hemoglobin stable. Follow-up with PCP Thrombocytopenia - Mild, stable, from sepsis Class I obesity - BMI 31, complicates cares    Discharge Instructions   Allergies as of 11/20/2023       Reactions   Ampicillin Anaphylaxis, Hives, Other (See Comments)   Has patient had a PCN reaction causing immediate rash, facial/tongue/throat swelling, SOB or lightheadedness with hypotension: Yes Has patient had a PCN reaction causing severe rash involving mucus membranes or skin necrosis: Yes Has patient had a PCN reaction that required hospitalization Yes Has patient had a PCN reaction occurring within the last 10 years: No If all of the above answers are NO, then may proceed with Cephalosporin use. Tolerated Kefzol  11/2023   Penicillins Anaphylaxis, Hives, Nausea And Vomiting, Other (See Comments)   Has patient had a PCN reaction causing immediate rash, facial/tongue/throat swelling, SOB or lightheadedness with hypotension: Yes Has patient had a PCN reaction causing severe rash involving mucus membranes or skin necrosis: Yes Has patient had a PCN reaction that required hospitalization Yes Has patient had a PCN reaction occurring within the last 10 years: No If all of the above answers are NO, then may proceed with Cephalosporin use. Has patient had a PCN reaction causing immediate rash, facial/tongue/throat swelling, SOB or lightheadedness with hypotension: Yes, Has patient had a PCN reaction causing severe rash involving mucus membranes or skin necrosis: Yes, Has patient had a PCN reaction that required hospitalization Yes, Has patient had a PCN reaction occurring within the last 10 years: No, If all of the above answers  are NO, then may proceed with Cephalosporin use.   Doxycycline Other (See Comments)   unknown   Other Swelling, Rash, Other (See Comments)   Blistex        Medication List     STOP taking these medications     benazepril  10 MG tablet Commonly known as: LOTENSIN    glimepiride  4 MG tablet Commonly known as: AMARYL    omeprazole  20 MG capsule Commonly known as: PriLOSEC       TAKE these medications    Accu-Chek Guide test strip Generic drug: glucose blood USE TO TEST THREE TIMES DAILY (E11.65)   allopurinol  300 MG tablet Commonly known as: ZYLOPRIM  Take by mouth.   ALPRAZolam  0.25 MG tablet Commonly known as: XANAX  Take 0.25 mg by mouth daily as needed for anxiety.   B-D UF III MINI PEN NEEDLES 31G X 5 MM Misc Generic drug: Insulin  Pen Needle   BARIATRIC FUSION PO Take 1 tablet by mouth daily.   cefadroxil  500 MG capsule Commonly known as: DURICEF Take 1 capsule (500 mg total) by mouth 2 (two) times daily for 6 days.   colchicine  0.6 MG tablet Take by mouth.   fluticasone  50 MCG/ACT nasal spray Commonly known as: FLONASE  Place 1 spray into both nostrils daily.   furosemide  40 MG tablet Commonly known as: LASIX  Take 1 tablet (40 mg total) by mouth daily.   insulin  NPH-regular Human (70-30) 100 UNIT/ML injection Inject 30-40 Units into the skin. Takes  40 units in the morning and 30 units at night   lidocaine  2 % solution Commonly known as: XYLOCAINE  Use as directed 15 mLs in the mouth or throat as needed for mouth pain.   Lyrica  200 MG capsule Generic drug: pregabalin  Take 200 mg by mouth 4 (four) times daily.   metoprolol  succinate 50 MG 24 hr tablet Commonly known as: TOPROL -XL Take 50 mg by mouth daily.   Narcan 4 MG/0.1ML Liqd nasal spray kit Generic drug: naloxone PLEASE SEE ATTACHED FOR DETAILED DIRECTIONS   NovoLOG  FlexPen 100 UNIT/ML FlexPen Generic drug: insulin  aspart Inject 2-15 Units into the skin 3 (three) times daily with meals.   olmesartan 20 MG tablet Commonly known as: BENICAR Take 20 mg by mouth daily.   OneTouch Delica Plus Lancet33G Misc CHECK GLUCOSE 3 TIMES A DAY   Oxycodone  HCl 10 MG Tabs Take 10 mg by mouth every 8  (eight) hours as needed.   pantoprazole  40 MG tablet Commonly known as: PROTONIX    simvastatin  40 MG tablet Commonly known as: ZOCOR    Vitamin D  (Ergocalciferol ) 1.25 MG (50000 UNIT) Caps capsule Commonly known as: DRISDOL    zolpidem 10 MG tablet Commonly known as: AMBIEN Take 10 mg by mouth at bedtime as needed.               Durable Medical Equipment  (From admission, onward)           Start     Ordered   11/19/23 0944  For home use only DME Walker rolling  Once       Comments: would benefit from bariatric size due to hip width)  Question Answer Comment  Walker: With 5 Inch Wheels   Patient needs a walker to treat with the following condition Weakness      11/19/23 0944   11/18/23 1341  For home use only DME 3 n 1  Once       Comments: would benefit from bariatric size due to hip width)   11/18/23  1341            Follow-up Information     [facility].   Specialty: [facility] Contact information: [address] [place] [address] [phone]                 Consultations: none  Procedures/Studies:  US  RENAL Result Date: 11/18/2023 CLINICAL DATA:  [id number] AKI (acute kidney injury) (HCC) [id number] EXAM: RENAL / URINARY TRACT ULTRASOUND COMPLETE COMPARISON:  None Available. FINDINGS: Right Kidney: Renal measurements: 10 x 4.4 x 5.7 cm = volume: 132 mL.Normal echogenicity. No mass. No hydronephrosis or nephrolithiasis. Left Kidney: Renal measurements: 9.7 x 4.5 x 4.8 cm = volume: 110 mL. Normal echogenicity. No mass. No hydronephrosis or nephrolithiasis. Bladder: Decompressed.  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 Other: Layering biliary sludge in the gallbladder. IMPRESSION: 1. No hydronephrosis or nephrolithiasis. 2. 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 The urinary bladder is otherwise decompressed, without focal abnormality. 3. Small volume biliary sludge layering in the gallbladder. Electronically Signed    By: Rogelia Myers M.D.   On: 11/18/2023 18:36   VAS US  LOWER EXTREMITY VENOUS (DVT) (7a-7p) Result Date: 11/17/2023  Lower Venous DVT Study Patient Name:  TAMEA BAI  Date of Exam:   11/16/2023 Medical Rec #: [id number]         Accession #:    [id number] Date of Birth: 09-Aug-1960          Patient Gender: F Patient Age:   63 years Exam Location:  [facility] Procedure:      VAS US  LOWER EXTREMITY VENOUS (DVT) Referring Phys: GLENDIA GOLDSTON --------------------------------------------------------------------------------  Indications: Pain, Swelling, Erythema, and Status post fall in shower today. Patient fell onto left lower extremity. Unknown downtime.  Limitations: Body habitus, poor ultrasound/tissue interface and musculoskeletal features. Comparison Study: Prior negative left LEV done 09/24/21. Negative left LE reflux                   study done 11/24/21 Performing Technologist: Alberta Lis RVS  Examination Guidelines: A complete evaluation includes B-mode imaging, spectral Doppler, color Doppler, and power Doppler as needed of all accessible portions of each vessel. Bilateral testing is considered an integral part of a complete examination. Limited examinations for reoccurring indications may be performed as noted. The reflux portion of the exam is performed with the patient in reverse Trendelenburg.  +-----+---------------+---------+-----------+----------+--------------+ RIGHTCompressibilityPhasicitySpontaneityPropertiesThrombus Aging +-----+---------------+---------+-----------+----------+--------------+ CFV  Full           Yes      No                                  +-----+---------------+---------+-----------+----------+--------------+ SFJ  Full                                                        +-----+---------------+---------+-----------+----------+--------------+   +---------+---------------+---------+-----------+----------+--------------+ LEFT      CompressibilityPhasicitySpontaneityPropertiesThrombus Aging +---------+---------------+---------+-----------+----------+--------------+ CFV      Full           Yes      No                                  +---------+---------------+---------+-----------+----------+--------------+  SFJ      Full                                                        +---------+---------------+---------+-----------+----------+--------------+ FV Prox  Full           Yes      No                                  +---------+---------------+---------+-----------+----------+--------------+ FV Mid   Full           Yes      No                                  +---------+---------------+---------+-----------+----------+--------------+ FV DistalFull           Yes      No                                  +---------+---------------+---------+-----------+----------+--------------+ PFV      Full           Yes      No                                  +---------+---------------+---------+-----------+----------+--------------+ POP      Full           Yes      No                                  +---------+---------------+---------+-----------+----------+--------------+ PTV      Full                                                        +---------+---------------+---------+-----------+----------+--------------+ PERO     Full                                                        +---------+---------------+---------+-----------+----------+--------------+ Gastroc  Full                                                        +---------+---------------+---------+-----------+----------+--------------+     Summary: RIGHT: - No evidence of deep vein thrombosis in the lower extremity. No indirect evidence of obstruction proximal to the inguinal ligament.  Pulsatile waveforms  LEFT: - There is no evidence of deep vein thrombosis in the lower extremity.  - No cystic structure found in the  popliteal fossa. - Ultrasound characteristics of enlarged lymph nodes noted in the groin. Pulsatile waveforms.  *See table(s) above  for measurements and observations. Electronically signed by Gaile New MD on 11/17/2023 at 8:25:44 PM.    Final    DG Femur Min 2 Views Left Result Date: 11/16/2023 CLINICAL DATA:  Fall. EXAM: LEFT FEMUR 2 VIEWS; LEFT TIBIA AND FIBULA - 2 VIEW; RIGHT KNEE - COMPLETE 4+ VIEW COMPARISON:  08/28/2023. FINDINGS: Left femur: There is no evidence of acute fracture or dislocation. Degenerative changes are present at the knee. Vascular calcifications are noted in the soft tissues. Right knee: There is no evidence of acute fracture or dislocation. Moderate to severe degenerative changes are noted at the knee, most pronounced in the medial and patellofemoral compartments. No joint effusion. Vascular calcifications are present in the soft tissues. Left tibia and fibula: There is no acute fracture or dislocation. Moderate degenerative changes are noted at the knee and most pronounced in the medial patellofemoral compartments. No joint effusion is seen. Vascular calcifications are noted. IMPRESSION: 1. No acute fracture or dislocation. 2. Degenerative changes at the knees bilaterally. Electronically Signed   By: Leita Birmingham M.D.   On: 11/16/2023 17:26   DG Tibia/Fibula Left Result Date: 11/16/2023 CLINICAL DATA:  Fall. EXAM: LEFT FEMUR 2 VIEWS; LEFT TIBIA AND FIBULA - 2 VIEW; RIGHT KNEE - COMPLETE 4+ VIEW COMPARISON:  08/28/2023. FINDINGS: Left femur: There is no evidence of acute fracture or dislocation. Degenerative changes are present at the knee. Vascular calcifications are noted in the soft tissues. Right knee: There is no evidence of acute fracture or dislocation. Moderate to severe degenerative changes are noted at the knee, most pronounced in the medial and patellofemoral compartments. No joint effusion. Vascular calcifications are present in the soft tissues. Left tibia and fibula:  There is no acute fracture or dislocation. Moderate degenerative changes are noted at the knee and most pronounced in the medial patellofemoral compartments. No joint effusion is seen. Vascular calcifications are noted. IMPRESSION: 1. No acute fracture or dislocation. 2. Degenerative changes at the knees bilaterally. Electronically Signed   By: Leita Birmingham M.D.   On: 11/16/2023 17:26   DG Knee Complete 4 Views Right Result Date: 11/16/2023 CLINICAL DATA:  Fall. EXAM: LEFT FEMUR 2 VIEWS; LEFT TIBIA AND FIBULA - 2 VIEW; RIGHT KNEE - COMPLETE 4+ VIEW COMPARISON:  08/28/2023. FINDINGS: Left femur: There is no evidence of acute fracture or dislocation. Degenerative changes are present at the knee. Vascular calcifications are noted in the soft tissues. Right knee: There is no evidence of acute fracture or dislocation. Moderate to severe degenerative changes are noted at the knee, most pronounced in the medial and patellofemoral compartments. No joint effusion. Vascular calcifications are present in the soft tissues. Left tibia and fibula: There is no acute fracture or dislocation. Moderate degenerative changes are noted at the knee and most pronounced in the medial patellofemoral compartments. No joint effusion is seen. Vascular calcifications are noted. IMPRESSION: 1. No acute fracture or dislocation. 2. Degenerative changes at the knees bilaterally. Electronically Signed   By: Leita Birmingham M.D.   On: 11/16/2023 17:26   CT CERVICAL SPINE WO CONTRAST Result Date: 11/16/2023 CLINICAL DATA:  Clemens, trauma EXAM: CT CERVICAL SPINE WITHOUT CONTRAST TECHNIQUE: Multidetector CT imaging of the cervical spine was performed without intravenous contrast. Multiplanar CT image reconstructions were also generated. RADIATION DOSE REDUCTION: This exam was performed according to the departmental dose-optimization program which includes automated exposure control, adjustment of the mA and/or kV according to patient size and/or use of  iterative reconstruction technique. COMPARISON:  None Available. FINDINGS:  Alignment: Slight reversal cervical lordosis is likely positional. Otherwise alignment is anatomic. Skull base and vertebrae: No acute fracture. No primary bone lesion or focal pathologic process. Soft tissues and spinal canal: No prevertebral fluid or swelling. No visible canal hematoma. Diffusely heterogeneous appearance of the thyroid , with a 2.4 cm hypodense nodule within the left lobe. Disc levels: Multilevel spondylosis with disc space narrowing and osteophyte formation most pronounced from C4-5 through C6-7. No significant bony encroachment upon the central canal or neural foramina. Upper chest: Airway is patent.  Lung apices are clear. Other: Reconstructed images demonstrate no additional findings. IMPRESSION: 1. No acute cervical spine fracture. 2. Multilevel cervical spondylosis as above. 3. Incidental left thyroid  nodule measuring 2.4 cm. Recommend non-emergent thyroid  ultrasound. Reference: J Am Coll Radiol. 2015 Feb;12(2): 143-50 Electronically Signed   By: Ozell Daring M.D.   On: 11/16/2023 15:25   CT HEAD WO CONTRAST Result Date: 11/16/2023 CLINICAL DATA:  Clemens, head trauma EXAM: CT HEAD WITHOUT CONTRAST TECHNIQUE: Contiguous axial images were obtained from the base of the skull through the vertex without intravenous contrast. RADIATION DOSE REDUCTION: This exam was performed according to the departmental dose-optimization program which includes automated exposure control, adjustment of the mA and/or kV according to patient size and/or use of iterative reconstruction technique. COMPARISON:  08/28/2023 FINDINGS: Brain: No acute infarct or hemorrhage. Lateral ventricles and midline structures appear unremarkable. No acute extra-axial fluid collections. No mass effect. Vascular: No hyperdense vessel or unexpected calcification. Skull: Normal. Negative for fracture or focal lesion. Sinuses/Orbits: Minimal fluid within the left  maxillary sinus. Remaining paranasal sinuses are clear. Other: None. IMPRESSION: 1. No acute intracranial process. Electronically Signed   By: Ozell Daring M.D.   On: 11/16/2023 15:22   DG Chest Port 1 View Result Date: 11/16/2023 CLINICAL DATA:  Status post fall. EXAM: PORTABLE CHEST 1 VIEW COMPARISON:  November 07, 2018 FINDINGS: The heart size and mediastinal contours are within normal limits. Both lungs are clear. Multilevel degenerative changes seen throughout the thoracic spine. IMPRESSION: No active disease. Electronically Signed   By: Suzen Dials M.D.   On: 11/16/2023 14:57   DG Pelvis Portable Result Date: 11/16/2023 CLINICAL DATA:  Status post fall. EXAM: PORTABLE PELVIS 1-2 VIEWS COMPARISON:  None Available. FINDINGS: There is no evidence of an acute pelvic fracture or diastasis. No pelvic bone lesions are seen. Degenerative changes are seen involving both hips. IMPRESSION: 1. No acute fracture or diastasis. 2. Degenerative changes involving both hips. Electronically Signed   By: Suzen Dials M.D.   On: 11/16/2023 14:55     Subjective: - no chest pain, shortness of breath, no abdominal pain, nausea or vomiting.   Discharge Exam: BP (!) 151/63 (BP Location: Left Arm)   Pulse 78   Temp 98 F (36.7 C) (Oral)   Resp 18   Ht 5' 3 (1.6 m)   Wt 80.2 kg   SpO2 100%   BMI 31.32 kg/m   General: Pt is alert, awake, not in acute distress Cardiovascular: RRR, S1/S2 +, no rubs, no gallops Respiratory: CTA bilaterally, no wheezing, no rhonchi Abdominal: Soft, NT, ND, bowel sounds + Extremities: no edema, no cyanosis    The results of significant diagnostics from this hospitalization (including imaging, microbiology, ancillary and laboratory) are listed below for reference.     Microbiology: Recent Results (from the past 240 hours)  Resp panel by RT-PCR (RSV, Flu A&B, Covid) Anterior Nasal Swab     Status: None   Collection Time: 11/16/23  3:12 PM   Specimen: Anterior  Nasal Swab  Result Value Ref Range Status   SARS Coronavirus 2 by RT PCR NEGATIVE NEGATIVE Final   Influenza A by PCR NEGATIVE NEGATIVE Final   Influenza B by PCR NEGATIVE NEGATIVE Final    Comment: (NOTE) The Xpert Xpress SARS-CoV-2/FLU/RSV plus assay is intended as an aid in the diagnosis of influenza from Nasopharyngeal swab specimens and should not be used as a sole basis for treatment. Nasal washings and aspirates are unacceptable for Xpert Xpress SARS-CoV-2/FLU/RSV testing.  Fact Sheet for Patients: BloggerCourse.com  Fact Sheet for Healthcare Providers: SeriousBroker.it  This test is not yet approved or cleared by the [place]  FDA and has been authorized for detection and/or diagnosis of SARS-CoV-2 by FDA under an Emergency Use Authorization (EUA). This EUA will remain in effect (meaning this test can be used) for the duration of the COVID-19 declaration under Section 564(b)(1) of the Act, 21 U.S.C. section 360bbb-3(b)(1), unless the authorization is terminated or revoked.     Resp Syncytial Virus by PCR NEGATIVE NEGATIVE Final    Comment: (NOTE) Fact Sheet for Patients: BloggerCourse.com  Fact Sheet for Healthcare Providers: SeriousBroker.it  This test is not yet approved or cleared by the [place]  FDA and has been authorized for detection and/or diagnosis of SARS-CoV-2 by FDA under an Emergency Use Authorization (EUA). This EUA will remain in effect (meaning this test can be used) for the duration of the COVID-19 declaration under Section 564(b)(1) of the Act, 21 U.S.C. section 360bbb-3(b)(1), unless the authorization is terminated or revoked.  Performed at [facility], [address] [place], [place] [address]   Blood culture (routine x 2)     Status: None (Preliminary result)   Collection Time: 11/16/23  3:35 PM   Specimen: BLOOD  Result Value  Ref Range Status   Specimen Description BLOOD LEFT ANTECUBITAL  Final   Special Requests   Final    BOTTLES DRAWN AEROBIC AND ANAEROBIC Blood Culture adequate volume   Culture   Final    NO GROWTH 4 DAYS Performed at [facility], [address], [place], [place] [address]    Report Status PENDING  Incomplete  Blood culture (routine x 2)     Status: None (Preliminary result)   Collection Time: 11/16/23  3:40 PM   Specimen: BLOOD RIGHT HAND  Result Value Ref Range Status   Specimen Description BLOOD RIGHT HAND  Final   Special Requests   Final    BOTTLES DRAWN AEROBIC AND ANAEROBIC Blood Culture results may not be optimal due to an inadequate volume of blood received in culture bottles   Culture   Final    NO GROWTH 4 DAYS Performed at [facility], [address], [place], [place] [address]    Report Status PENDING  Incomplete     Labs: Basic Metabolic Panel: Recent Labs  Lab 11/16/23 1430 11/16/23 1451 11/17/23 0642 11/18/23 0424 11/19/23 0326 11/20/23 0229  NA 137 138 137 136 135 136  K 5.2* 5.2* 4.5 5.3* 4.8 4.6  CL 106 110 106 105 103 107  CO2 18*  --  17* 18* 18* 21*  GLUCOSE 222* 231* 77 136* 102* 136*  BUN 47* 41* 39* 36* 33* 29*  CREATININE 2.49* 2.70* 2.12* 2.22* 1.91* 1.74*  CALCIUM 9.0  --  9.1 8.6* 8.7* 9.0  MG  --   --  1.7  --   --   --  Liver Function Tests: Recent Labs  Lab 11/16/23 1430 11/19/23 0326  AST 24 42*  ALT 23 21  ALKPHOS 57 48  BILITOT 0.6 0.7  PROT 8.4* 7.5  ALBUMIN 3.3* 2.6*   CBC: Recent Labs  Lab 11/16/23 1430 11/16/23 1451 11/17/23 0642 11/18/23 0424 11/19/23 0326 11/20/23 0229  WBC 14.9*  --  8.7 6.7 6.9 5.8  HGB 10.3* 10.2* 10.1* 9.2* 9.2* 10.0*  HCT 31.8* 30.0* 30.7* 28.2* 28.5* 29.8*  MCV 86.9  --  86.0 85.7 86.1 85.6  PLT 111*  --  104* 103* 109* 118*   CBG: Recent Labs  Lab 11/19/23 0741 11/19/23 0918 11/19/23 1215 11/19/23 1825 11/20/23 0847  GLUCAP 75 157* 114* 131* 114*   Hgb  A1c No results for input(s): HGBA1C in the last 72 hours. Lipid Profile No results for input(s): CHOL, HDL, LDLCALC, TRIG, CHOLHDL, LDLDIRECT in the last 72 hours. Thyroid  function studies No results for input(s): TSH, T4TOTAL, T3FREE, THYROIDAB in the last 72 hours.  Invalid input(s): FREET3 Urinalysis    Component Value Date/Time   COLORURINE YELLOW (A) 11/07/2018 0059   APPEARANCEUR HAZY (A) 11/07/2018 0059   LABSPEC 1.020 11/07/2018 0059   PHURINE 5.0 11/07/2018 0059   GLUCOSEU >=500 (A) 11/07/2018 0059   HGBUR MODERATE (A) 11/07/2018 0059   BILIRUBINUR NEGATIVE 11/07/2018 0059   KETONESUR NEGATIVE 11/07/2018 0059   PROTEINUR 100 (A) 11/07/2018 0059   UROBILINOGEN 0.2 04/28/2017 1310   NITRITE POSITIVE (A) 11/07/2018 0059   LEUKOCYTESUR SMALL (A) 11/07/2018 0059    FURTHER DISCHARGE INSTRUCTIONS:   Get Medicines reviewed and adjusted: Please take all your medications with you for your next visit with your Primary MD   Laboratory/radiological data: Please request your Primary MD to go over all hospital tests and procedure/radiological results at the follow up, please ask your Primary MD to get all Hospital records sent to his/her office.   In some cases, they will be blood work, cultures and biopsy results pending at the time of your discharge. Please request that your primary care M.D. goes through all the records of your hospital data and follows up on these results.   Also Note the following: If you experience worsening of your admission symptoms, develop shortness of breath, life threatening emergency, suicidal or homicidal thoughts you must seek medical attention immediately by calling 911 or calling your MD immediately  if symptoms less severe.   You must read complete instructions/literature along with all the possible adverse reactions/side effects for all the Medicines you take and that have been prescribed to you. Take any new Medicines after  you have completely understood and accpet all the possible adverse reactions/side effects.    Do not drive when taking Pain medications or sleeping medications (Benzodaizepines)   Do not take more than prescribed Pain, Sleep and Anxiety Medications. It is not advisable to combine anxiety,sleep and pain medications without talking with your primary care practitioner   Special Instructions: If you have smoked or chewed Tobacco  in the last 2 yrs please stop smoking, stop any regular Alcohol  and or any Recreational drug use.   Wear Seat belts while driving.   Please note: You were cared for by a hospitalist during your hospital stay. Once you are discharged, your primary care physician will handle any further medical issues. Please note that NO REFILLS for any discharge medications will be authorized once you are discharged, as it is imperative that you return to your primary care physician (or establish  a relationship with a primary care physician if you do not have one) for your post hospital discharge needs so that they can reassess your need for medications and monitor your lab values.  Time coordinating discharge: 35 minutes  SIGNED:  Nilda Fendt, MD, PhD 11/20/2023, 9:29 AM

## 2023-11-20 NOTE — Plan of Care (Signed)
  Problem: Education: Goal: Ability to describe self-care measures that may prevent or decrease complications (Diabetes Survival Skills Education) will improve Outcome: Progressing   Problem: Coping: Goal: Ability to adjust to condition or change in health will improve Outcome: Progressing   Problem: Health Behavior/Discharge Planning: Goal: Ability to manage health-related needs will improve Outcome: Progressing   Problem: Metabolic: Goal: Ability to maintain appropriate glucose levels will improve Outcome: Progressing   Problem: Nutritional: Goal: Maintenance of adequate nutrition will improve Outcome: Progressing   Problem: Skin Integrity: Goal: Risk for impaired skin integrity will decrease Outcome: Progressing   Problem: Tissue Perfusion: Goal: Adequacy of tissue perfusion will improve Outcome: Progressing   Problem: Activity: Goal: Risk for activity intolerance will decrease Outcome: Progressing   Problem: Safety: Goal: Ability to remain free from injury will improve Outcome: Progressing   Problem: Skin Integrity: Goal: Risk for impaired skin integrity will decrease Outcome: Progressing

## 2023-11-20 NOTE — Progress Notes (Signed)
 Occupational Therapy Treatment Patient Details Name: Lacey Meyer MRN: [id number] DOB: Jul 24, 1960 Today's Date: 11/20/2023   History of present illness Pt is 63 yo female who presents on 11/16/23 after fall at home with hit to posterior head with + LOC. Pt also with LLE pain and was on floor several hours. Pt with SIRS, AKI, new confusion, concern for sepsis from cellulitis LLE. PMH: DM, HTN, arthritis, gout, chronic visual changes, gastric bypass   OT comments  Pt progressing toward goals this session, up in bathroom upon arrival, able to ambulate hallway distance with use of RW, pt states she has purchased a tub bench for home that is getting delivered. Educated pt on fall prevention strategies for home and pt verbalized understanding. Continue to recommend HHOT at d/c.       If plan is discharge home, recommend the following:  A little help with walking and/or transfers;A little help with bathing/dressing/bathroom;Assistance with cooking/housework;Assist for transportation;Help with stairs or ramp for entrance   Equipment Recommendations  Tub/shower bench    Recommendations for Other Services PT consult    Precautions / Restrictions Precautions Precautions: Fall Recall of Precautions/Restrictions: Intact Precaution/Restrictions Comments: legally blind Restrictions Weight Bearing Restrictions Per Provider Order: No       Mobility Bed Mobility               General bed mobility comments: EOB upon arrival and in chair at departure    Transfers Overall transfer level: Needs assistance Equipment used: Rolling walker (2 wheels) Transfers: Sit to/from Stand Sit to Stand: Supervision                 Balance Overall balance assessment: History of Falls, Needs assistance Sitting-balance support: Feet supported, No upper extremity supported Sitting balance-Leahy Scale: Good     Standing balance support: No upper extremity supported, During functional  activity Standing balance-Leahy Scale: Fair                             ADL either performed or assessed with clinical judgement   ADL Overall ADL's : Needs assistance/impaired                 Upper Body Dressing : Minimal assistance;Sitting   Lower Body Dressing: Supervision/safety;Sit to/from stand;Sitting/lateral leans   Toilet Transfer: Supervision/safety;Ambulation;Rolling walker (2 wheels)           Functional mobility during ADLs: Supervision/safety;Rolling walker (2 wheels)      Extremity/Trunk Assessment Upper Extremity Assessment Upper Extremity Assessment: Overall WFL for tasks assessed   Lower Extremity Assessment Lower Extremity Assessment: Defer to PT evaluation        Vision   Additional Comments: legally blind at baseline, pt reports using magnifying glass to see   Perception Perception Perception: Not tested   Praxis Praxis Praxis: Not tested   Communication Communication Communication: No apparent difficulties   Cognition Arousal: Alert, Lethargic Behavior During Therapy: WFL for tasks assessed/performed Cognition: No apparent impairments                               Following commands: Intact        Cueing   Cueing Techniques: Verbal cues  Exercises      Shoulder Instructions       General Comments VSS    Pertinent Vitals/ Pain       Pain Assessment Pain Assessment: Faces Pain  Score: 4  Faces Pain Scale: Hurts little more Pain Location: B feet (neuropathic pain) Pain Descriptors / Indicators: Shooting Pain Intervention(s): Limited activity within patient's tolerance, Monitored during session, Repositioned  Home Living                                          Prior Functioning/Environment              Frequency  Min 2X/week        Progress Toward Goals  OT Goals(current goals can now be found in the care plan section)  Progress towards OT goals: Progressing  toward goals  Acute Rehab OT Goals Patient Stated Goal: none stated OT Goal Formulation: With patient Time For Goal Achievement: 12/02/23 Potential to Achieve Goals: Good ADL Goals Pt Will Perform Upper Body Dressing: Independently;standing;sitting Pt Will Perform Lower Body Dressing: Independently;sitting/lateral leans;sit to/from stand Pt Will Transfer to Toilet: Independently;ambulating;regular height toilet Pt Will Perform Tub/Shower Transfer: Tub transfer;Shower transfer;Independently;ambulating Additional ADL Goal #1: Pt will verbalize x3 fall prevention strategies for safety with ADLs  Plan      Co-evaluation                 AM-PAC OT 6 Clicks Daily Activity     Outcome Measure   Help from another person eating meals?: A Little Help from another person taking care of personal grooming?: A Little Help from another person toileting, which includes using toliet, bedpan, or urinal?: A Little Help from another person bathing (including washing, rinsing, drying)?: A Little Help from another person to put on and taking off regular upper body clothing?: A Little Help from another person to put on and taking off regular lower body clothing?: A Little 6 Click Score: 18    End of Session Equipment Utilized During Treatment: Rolling walker (2 wheels)  OT Visit Diagnosis: Unsteadiness on feet (R26.81);Other abnormalities of gait and mobility (R26.89);Muscle weakness (generalized) (M62.81)   Activity Tolerance Patient tolerated treatment well   Patient Left in chair;with call bell/phone within reach;with family/visitor present   Nurse Communication Mobility status        Time: 9242-9182 OT Time Calculation (min): 20 min  Charges: OT General Charges $OT Visit: 1 Visit OT Treatments $Self Care/Home Management : 8-22 mins  Diamante Truszkowski K, OTD, OTR/L [employer] Preferred Acute Rehab [phone]     Lacey Meyer K Koonce 11/20/2023, 9:30 AM

## 2023-11-20 NOTE — Plan of Care (Signed)

## 2023-11-20 NOTE — Progress Notes (Signed)
 Pt verbally understands discharge instructions.   RW and 3n1 at bedside.   Patient ambulates with and without walker.  PIV removed.  Pressure dressing applied.  Ride on the way.  Transferring to D/C lounge TOC meds in process.

## 2023-11-21 LAB — CULTURE, BLOOD (ROUTINE X 2)
Culture: NO GROWTH
Culture: NO GROWTH
Special Requests: ADEQUATE

## 2024-02-10 ENCOUNTER — Ambulatory Visit (INDEPENDENT_AMBULATORY_CARE_PROVIDER_SITE_OTHER): Admitting: Podiatry

## 2024-02-10 ENCOUNTER — Encounter: Payer: Self-pay | Admitting: Podiatry

## 2024-02-10 DIAGNOSIS — Z794 Long term (current) use of insulin: Secondary | ICD-10-CM | POA: Diagnosis not present

## 2024-02-10 DIAGNOSIS — E1140 Type 2 diabetes mellitus with diabetic neuropathy, unspecified: Secondary | ICD-10-CM | POA: Diagnosis not present

## 2024-02-10 DIAGNOSIS — M79674 Pain in right toe(s): Secondary | ICD-10-CM

## 2024-02-10 DIAGNOSIS — M79675 Pain in left toe(s): Secondary | ICD-10-CM

## 2024-02-10 DIAGNOSIS — B351 Tinea unguium: Secondary | ICD-10-CM

## 2024-02-10 NOTE — Progress Notes (Signed)
 This patient returns to my office for at risk foot care.  This patient requires this care by a professional since this patient will be at risk due to having diabetes. She presents to the office with her husband.  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 This patient is unable to cut nails herself since the patient cannot reach her nails.These nails are painful walking and wearing shoes.  This patient presents for at risk foot care today.  General Appearance  Alert, conversant and in no acute stress.  Vascular  Dorsalis pedis pulses are palpable  bilaterally.  Posterior tibial pulses ae absent due to swelling.  Capillary return is within normal limits  bilaterally. Temperature is within normal limits  bilaterally.Absent hair on digits.  Neurologic  Senn-Weinstein monofilament wire test diminished  bilaterally. Muscle power within normal limits bilaterally.  Nails Thick disfigured discolored nails with subungual debris  from hallux to fifth toes bilaterally. No evidence of bacterial infection or drainage bilaterally.  Orthopedic  No limitations of motion  feet .  No crepitus or effusions noted.  No bony pathology or digital deformities noted.  Skin  normotropic skin with no porokeratosis noted bilaterally.  No signs of infections or ulcers noted.     Onychomycosis  Pain in right toes  Pain in left toes  Consent was obtained for treatment procedures.   Mechanical debridement of nails 1-5  bilaterally performed with a nail nipper.  Filed with dremel without incident.   Return office visit   3 months                   Told patient to return for periodic foot care and evaluation due to potential at risk complications.   Helane Gunther DPM

## 2024-05-11 ENCOUNTER — Ambulatory Visit: Admitting: Podiatry
